# Patient Record
Sex: Female | Born: 1980 | Hispanic: Yes | Marital: Married | Smoking: Former smoker
Health system: Southern US, Community
[De-identification: ages and names within clinical notes are randomized; demographics above are authoritative.]

## PROBLEM LIST (undated history)

## (undated) ENCOUNTER — Inpatient Hospital Stay (HOSPITAL_COMMUNITY): Payer: Self-pay

## (undated) ENCOUNTER — Emergency Department (HOSPITAL_COMMUNITY): Payer: Self-pay

## (undated) DIAGNOSIS — E669 Obesity, unspecified: Secondary | ICD-10-CM

## (undated) HISTORY — DX: Obesity, unspecified: E66.9

---

## 2008-05-29 DIAGNOSIS — E282 Polycystic ovarian syndrome: Secondary | ICD-10-CM

## 2008-05-29 HISTORY — DX: Polycystic ovarian syndrome: E28.2

## 2015-08-29 ENCOUNTER — Encounter (HOSPITAL_COMMUNITY): Payer: Self-pay | Admitting: *Deleted

## 2015-08-29 ENCOUNTER — Emergency Department (HOSPITAL_COMMUNITY): Payer: Self-pay

## 2015-08-29 ENCOUNTER — Emergency Department (HOSPITAL_COMMUNITY)
Admission: EM | Admit: 2015-08-29 | Discharge: 2015-08-29 | Disposition: A | Discharge status: Home / Self Care | Payer: Self-pay | Attending: Emergency Medicine | Admitting: Emergency Medicine

## 2015-08-29 DIAGNOSIS — O200 Threatened abortion: Secondary | ICD-10-CM | POA: Insufficient documentation

## 2015-08-29 DIAGNOSIS — O23591 Infection of other part of genital tract in pregnancy, first trimester: Secondary | ICD-10-CM | POA: Insufficient documentation

## 2015-08-29 DIAGNOSIS — N760 Acute vaginitis: Secondary | ICD-10-CM

## 2015-08-29 DIAGNOSIS — O99331 Smoking (tobacco) complicating pregnancy, first trimester: Secondary | ICD-10-CM | POA: Insufficient documentation

## 2015-08-29 DIAGNOSIS — Z3A01 Less than 8 weeks gestation of pregnancy: Secondary | ICD-10-CM | POA: Insufficient documentation

## 2015-08-29 DIAGNOSIS — N939 Abnormal uterine and vaginal bleeding, unspecified: Secondary | ICD-10-CM

## 2015-08-29 DIAGNOSIS — F17210 Nicotine dependence, cigarettes, uncomplicated: Secondary | ICD-10-CM | POA: Insufficient documentation

## 2015-08-29 DIAGNOSIS — B9689 Other specified bacterial agents as the cause of diseases classified elsewhere: Secondary | ICD-10-CM

## 2015-08-29 LAB — WET PREP, GENITAL
SPERM: NONE SEEN
TRICH WET PREP: NONE SEEN
YEAST WET PREP: NONE SEEN

## 2015-08-29 LAB — CBC WITH DIFFERENTIAL/PLATELET
Basophils Absolute: 0 10*3/uL (ref 0.0–0.1)
Basophils Relative: 0 %
EOS PCT: 1 %
Eosinophils Absolute: 0.1 10*3/uL (ref 0.0–0.7)
HCT: 37.6 % (ref 36.0–46.0)
HEMOGLOBIN: 12.6 g/dL (ref 12.0–15.0)
LYMPHS ABS: 3.7 10*3/uL (ref 0.7–4.0)
LYMPHS PCT: 39 %
MCH: 28.8 pg (ref 26.0–34.0)
MCHC: 33.5 g/dL (ref 30.0–36.0)
MCV: 85.8 fL (ref 78.0–100.0)
Monocytes Absolute: 0.7 10*3/uL (ref 0.1–1.0)
Monocytes Relative: 7 %
NEUTROS PCT: 53 %
Neutro Abs: 4.9 10*3/uL (ref 1.7–7.7)
Platelets: 221 10*3/uL (ref 150–400)
RBC: 4.38 MIL/uL (ref 3.87–5.11)
RDW: 13.2 % (ref 11.5–15.5)
WBC: 9.4 10*3/uL (ref 4.0–10.5)

## 2015-08-29 LAB — BASIC METABOLIC PANEL
Anion gap: 6 (ref 5–15)
BUN: 12 mg/dL (ref 6–20)
CHLORIDE: 109 mmol/L (ref 101–111)
CO2: 23 mmol/L (ref 22–32)
Calcium: 8.7 mg/dL — ABNORMAL LOW (ref 8.9–10.3)
Creatinine, Ser: 0.66 mg/dL (ref 0.44–1.00)
GFR calc Af Amer: >60 mL/min (ref >60)
GFR calc non Af Amer: >60 mL/min (ref >60)
Glucose, Bld: 91 mg/dL (ref 65–99)
POTASSIUM: 3.7 mmol/L (ref 3.5–5.1)
SODIUM: 138 mmol/L (ref 135–145)

## 2015-08-29 LAB — URINALYSIS, ROUTINE W REFLEX MICROSCOPIC
BILIRUBIN URINE: NEGATIVE
GLUCOSE, UA: NEGATIVE mg/dL
KETONES UR: NEGATIVE mg/dL
Nitrite: NEGATIVE
PH: 6 (ref 5.0–8.0)
PROTEIN: NEGATIVE mg/dL
SPECIFIC GRAVITY, URINE: 1.023 (ref 1.005–1.030)

## 2015-08-29 LAB — URINE MICROSCOPIC-ADD ON

## 2015-08-29 LAB — I-STAT BETA HCG BLOOD, ED (MC, WL, AP ONLY): I-stat hCG, quantitative: >2000 m[IU]/mL — ABNORMAL HIGH (ref <5)

## 2015-08-29 LAB — HCG, QUANTITATIVE, PREGNANCY: hCG, Beta Chain, Quant, S: 16821 m[IU]/mL — ABNORMAL HIGH (ref <5)

## 2015-08-29 LAB — ABO/RH: ABO/RH(D): AB POS

## 2015-08-29 MED ORDER — METRONIDAZOLE 500 MG PO TABS: 500.0000 mg | ORAL_TABLET | Freq: Two times a day (BID) | ORAL | Status: DC

## 2015-08-29 NOTE — Discharge Instructions (Signed)
[facility] (TUESDAY April 4TH) TO HAVE YOUR BLOODWORK RECHECKED  Return to the ED with any concerns including vomiting and not able to keep down liquids, bleeding and soaking more than one pad per hour, worsening abdominal pain, fainting, decreased level of alertness/lethargy, or any other alarming symptoms

## 2015-08-29 NOTE — ED Provider Notes (Signed)
CSN: [id number]     Arrival date & time 08/29/15  0012 History  By signing my name below, I, Phillis Haggis, attest that this documentation has been prepared under the direction and in the presence of Jerelyn Scott, MD. Electronically Signed: Phillis Haggis, ED Scribe. 08/29/2015. 3:11 AM.  Chief Complaint  Patient presents with  . Abdominal Pain  . Vaginal Bleeding   Patient is a 35 y.o. female presenting with abdominal pain and vaginal bleeding. The history is provided by a friend. No language interpreter was used.  Abdominal Pain Pain location:  LLQ and RLQ Pain quality: cramping   Pain radiates to:  Does not radiate Pain severity:  Moderate Onset quality:  Sudden Duration:  2 days Timing:  Constant Progression:  Worsening Chronicity:  New Ineffective treatments:  None tried Associated symptoms: vaginal bleeding   Associated symptoms: no chills, no fever, no nausea and no vomiting   Risk factors: pregnancy   Vaginal Bleeding Associated symptoms: abdominal pain   Associated symptoms: no dizziness, no fever and no nausea   HPI Comments: Lindsey Wells is a 35 y.o. Female G2P1 who presents to the Emergency Department complaining of lower cramping abdominal pain onset 2 days ago. She reports associated vaginal bleeding with small amount of clots. LMP 07/15/15. This is the pt's second pregnancy and she had a normal, full term first pregnancy. She does not currently have OB/GYN care. Pt denies fever, chills, nausea, vomiting, dizziness, or lightheadedness. Pt speaks Spanish and is translated by a significant other.   History reviewed. No pertinent past medical history. History reviewed. No pertinent past surgical history. No family history on file. Social History  Substance Use Topics  . Smoking status: Current Some Day Smoker  . Smokeless tobacco: None  . Alcohol Use: Yes     Comment: occ   OB History    Gravida Para Term Preterm AB TAB SAB Ectopic Multiple Living   1               Review of Systems  Constitutional: Negative for fever and chills.  Gastrointestinal: Positive for abdominal pain. Negative for nausea and vomiting.  Genitourinary: Positive for vaginal bleeding.  Neurological: Negative for dizziness and light-headedness.  All other systems reviewed and are negative.     Allergies  Review of patient's allergies indicates no known allergies.  Home Medications   Prior to Admission medications   Medication Sig Start Date End Date Taking? Authorizing Provider  metroNIDAZOLE (FLAGYL) 500 MG tablet Take 1 tablet (500 mg total) by mouth 2 (two) times daily. 08/29/15   Jerelyn Scott, MD   BP 110/72 mmHg  Pulse 46  Temp(Src) 99.2 F (37.3 C) (Oral)  Resp 17  SpO2 99%  LMP 07/15/2015  Vitals reviewed Physical Exam  Physical Examination: General appearance - alert, well appearing, and in no distress Mental status - alert, oriented to person, place, and time Eyes - no conjunctival injection, no scleral icterus Mouth - mucous membranes moist, pharynx normal without lesions Chest - clear to auscultation, no wheezes, rales or rhonchi, symmetric air entry Heart - normal rate, regular rhythm, normal S1, S2, no murmurs, rubs, clicks or gallops Abdomen - soft, nontender, nondistended, no masses or organomegaly Pelvic - normal external genitalia, vulva, vagina, cervix, uterus and adnexa, yellowish discharge, os closed, no active bleeding Neurological - alert, oriented, normal speech Extremities - peripheral pulses normal, no pedal edema, no clubbing or cyanosis Skin - normal coloration and turgor, no rashes  ED Course  Procedures (including critical care time) DIAGNOSTIC STUDIES: Oxygen Saturation is 99% on RA, normal by my interpretation.    COORDINATION OF CARE: 2:02 AM-Discussed treatment plan which includes labs, pelvic exam and [place] with pt at bedside and pt agreed to plan.    Labs Review Labs Reviewed  WET PREP, GENITAL - Abnormal; Notable for the  following:    Clue Cells Wet Prep HPF POC PRESENT (*)    WBC, Wet Prep HPF POC MANY (*)    All other components within normal limits  BASIC METABOLIC PANEL - Abnormal; Notable for the following:    Calcium 8.7 (*)    All other components within normal limits  URINALYSIS, ROUTINE W REFLEX MICROSCOPIC (NOT AT [facility]) - Abnormal; Notable for the following:    APPearance CLOUDY (*)    Hgb urine dipstick TRACE (*)    Leukocytes, UA SMALL (*)    All other components within normal limits  URINE MICROSCOPIC-ADD ON - Abnormal; Notable for the following:    Squamous Epithelial / LPF 6-30 (*)    Bacteria, UA FEW (*)    All other components within normal limits  HCG, QUANTITATIVE, PREGNANCY - Abnormal; Notable for the following:    hCG, Beta Chain, Quant, S 16109 (*)    All other components within normal limits  I-STAT BETA HCG BLOOD, ED ([facility], [facility], [facility] ONLY) - Abnormal; Notable for the following:    I-stat hCG, quantitative >2000.0 (*)    All other components within normal limits  CBC WITH DIFFERENTIAL/PLATELET  ABO/RH  GC/CHLAMYDIA PROBE AMP ([place]) NOT AT [facility]    Imaging Review US Ob Comp Less 14 Wks  08/29/2015  CLINICAL DATA:  Six weeks 3 days pregnant by last menstrual with pain for 2 days. Bleeding. Beta HCG greater than 2000. EXAM: OBSTETRIC <14 WK [place] AND TRANSVAGINAL OB US TECHNIQUE: Both transabdominal and transvaginal ultrasound examinations were performed for complete evaluation of the gestation as well as the maternal uterus, adnexal regions, and pelvic cul-de-sac. Transvaginal technique was performed to assess early pregnancy. COMPARISON:  None. FINDINGS: Intrauterine gestational sac: Visualized/normal in shape. Yolk sac:  Absent Embryo:  Absent Cardiac Activity: Absent MSD: 18  mm   6  w   5  d [place] EDC: 04/18/2016 Subchorionic hemorrhage: Small volume subchronic hemorrhage identified anteriorly, including on image 40. Maternal uterus/adnexae: Normal appearance of the ovaries. Trace free  pelvic fluid is likely physiologic. IMPRESSION: 1. Intrauterine gestational sac of 18 mm, corresponding to 6 weeks 5 days. Lack of visualization of fetal pole. Although this could be due to early gestational age, given the sac diameter, this is suspicious for a nonviable pregnancy. Consider short term ultrasound and/or beta HCG follow-up. 2. Small volume subchronic hemorrhage. Electronically Signed   By: Jeronimo Greaves M.D.   On: 08/29/2015 02:56   US Ob Transvaginal  08/29/2015  CLINICAL DATA:  Six weeks 3 days pregnant by last menstrual with pain for 2 days. Bleeding. Beta HCG greater than 2000. EXAM: OBSTETRIC <14 WK [place] AND TRANSVAGINAL OB US TECHNIQUE: Both transabdominal and transvaginal ultrasound examinations were performed for complete evaluation of the gestation as well as the maternal uterus, adnexal regions, and pelvic cul-de-sac. Transvaginal technique was performed to assess early pregnancy. COMPARISON:  None. FINDINGS: Intrauterine gestational sac: Visualized/normal in shape. Yolk sac:  Absent Embryo:  Absent Cardiac Activity: Absent MSD: 18  mm   6  w   5  d [place] EDC: 04/18/2016 Subchorionic hemorrhage: Small volume subchronic hemorrhage identified  anteriorly, including on image 40. Maternal uterus/adnexae: Normal appearance of the ovaries. Trace free pelvic fluid is likely physiologic. IMPRESSION: 1. Intrauterine gestational sac of 18 mm, corresponding to 6 weeks 5 days. Lack of visualization of fetal pole. Although this could be due to early gestational age, given the sac diameter, this is suspicious for a nonviable pregnancy. Consider short term ultrasound and/or beta HCG follow-up. 2. Small volume subchronic hemorrhage. Electronically Signed   By: Jeronimo GreavesKyle  Talbot M.D.   On: 08/29/2015 02:56   I have personally reviewed and evaluated these images and lab results as part of my medical decision-making.   EKG Interpretation None      MDM   Final diagnoses:  Vaginal bleeding  Threatened  abortion in early pregnancy  Bacterial vaginosis    Pt presenting with c/o vaginal bleeding in early pregnancy.  Ultrasound shows gestational sac without fetal pole or yolk sac.  Pelvic exam with os closed no significant bleeding- wet prep shows BV- will treat with flagyl.  Pt will need f/u quant in 48 hours and repeat ultrasound in 1 week- quant is 16, 000 which is above the threshold that should be seen on ultrasound.     5:20 AM d/w Dr. Despina HiddenEure, OB/GYN- he recommends patient followup at [facility] in 48 hours for repeat quant.    Pt verbalizes understanding of this plan.    I personally performed the services described in this documentation, which was scribed in my presence. The recorded information has been reviewed and is accurate.    Jerelyn ScottMartha Linker, MD 08/29/15 [id number]

## 2015-08-29 NOTE — ED Notes (Signed)
PT is [redacted] weeks pregnant and started having abdominal pain and vaginal bleeding for 2 days.  G-2 P-1 A 0.

## 2015-08-29 NOTE — ED Notes (Signed)
Patient is alert and orientedx4.  Patient was explained discharge instructions and they understood them with no questions.   

## 2015-08-29 NOTE — ED Notes (Signed)
Taken to US at this time. 

## 2015-08-30 ENCOUNTER — Inpatient Hospital Stay (HOSPITAL_COMMUNITY)
Admission: AD | Admit: 2015-08-30 | Discharge: 2015-08-30 | Disposition: A | Discharge status: Home / Self Care | Payer: Self-pay | Source: Ambulatory Visit | Attending: Family Medicine | Admitting: Family Medicine

## 2015-08-30 ENCOUNTER — Encounter (HOSPITAL_COMMUNITY): Payer: Self-pay | Admitting: *Deleted

## 2015-08-30 DIAGNOSIS — O26891 Other specified pregnancy related conditions, first trimester: Secondary | ICD-10-CM | POA: Insufficient documentation

## 2015-08-30 DIAGNOSIS — Z3A01 Less than 8 weeks gestation of pregnancy: Secondary | ICD-10-CM | POA: Insufficient documentation

## 2015-08-30 DIAGNOSIS — Z79899 Other long term (current) drug therapy: Secondary | ICD-10-CM | POA: Insufficient documentation

## 2015-08-30 DIAGNOSIS — O9989 Other specified diseases and conditions complicating pregnancy, childbirth and the puerperium: Secondary | ICD-10-CM

## 2015-08-30 DIAGNOSIS — F17200 Nicotine dependence, unspecified, uncomplicated: Secondary | ICD-10-CM | POA: Insufficient documentation

## 2015-08-30 DIAGNOSIS — R109 Unspecified abdominal pain: Secondary | ICD-10-CM

## 2015-08-30 DIAGNOSIS — O3680X0 Pregnancy with inconclusive fetal viability, not applicable or unspecified: Secondary | ICD-10-CM

## 2015-08-30 LAB — GC/CHLAMYDIA PROBE AMP (~~LOC~~) NOT AT ARMC
Chlamydia: NEGATIVE
Neisseria Gonorrhea: NEGATIVE

## 2015-08-30 LAB — HCG, QUANTITATIVE, PREGNANCY: HCG, BETA CHAIN, QUANT, S: 22269 m[IU]/mL — AB (ref <5)

## 2015-08-30 NOTE — [facility] Note (Signed)
Pt presents for follow up blood work . Denies bleeding or pain.

## 2015-08-30 NOTE — MAU Provider Note (Signed)
History   [id number]   Chief Complaint  Patient presents with  . Labs Only    f/u BHCG    HPI Lindsey Wells is a 35 y.o. female G1P0 here for follow-up BHCG.  Upon review of the records patient was first [facility] (just after midnight[facility]. BHCG on that day was [id number].  Ultrasound showed 18 mm IUGS, no yolk sac, & a small SCH. GC/CT and wet prep were collected. Results were negative. Pt discharged home & [facility]. Pt here today with no report of abdominal pain or vaginal bleeding. All other systems negative.    Patient's last menstrual period was 07/15/2015.  OB History  Gravida Para Term Preterm AB SAB TAB Ectopic Multiple Living  1             # Outcome Date GA Lbr Len/2nd Weight Sex Delivery Anes PTL Lv  1 Current               No past medical history on file.  No family history on file.  Social History   Social History  . Marital Status: Single    Spouse Name: N/A  . Number of Children: N/A  . Years of Education: N/A   Social History Main Topics  . Smoking status: Current Some Day Smoker  . Smokeless tobacco: Not on file  . Alcohol Use: Yes     Comment: occ  . Drug Use: No  . Sexual Activity: Not on file   Other Topics Concern  . Not on file   Social History Narrative    No Known Allergies  No current facility-administered medications on file prior to encounter.   Current Outpatient Prescriptions on File Prior to Encounter  Medication Sig Dispense Refill  . metroNIDAZOLE (FLAGYL) 500 MG tablet Take 1 tablet (500 mg total) by mouth 2 (two) times daily. 14 tablet 0     Physical Exam   Filed Vitals:   08/30/15 2011  BP: 118/50  Pulse: 55  Temp: 98.4 F (36.9 C)  TempSrc: Oral  Resp: 16  Height: 5\' 5"  (1.651 m)  Weight: 180 lb (81.647 kg)  SpO2: 100%    Physical Exam  Constitutional: She is oriented to person, place, and time. She appears well-developed and well-nourished. No distress.   HENT:  Head: Normocephalic and atraumatic.  Respiratory: Effort normal. No respiratory distress.  Musculoskeletal: Normal range of motion.  Neurological: She is alert and oriented to person, place, and time.  Skin: She is not diaphoretic.  Psychiatric: She has a normal mood and affect. Her behavior is normal. Judgment and thought content normal.    MAU Course  Procedures Component     Latest Ref Rng 08/29/2015 08/30/2015  HCG, Beta Chain, Quant, S     <5 mIU/mL 16821 (H) 22269 (H)   MDM AB positive Denies pain or bleeding S/w Dr. Adrian Wells regarding BHCG & previous ultrasound. Pt to f/u in clinic Thursday morning for repeat BHCG  Assessment and Plan  35 y.o. G1P0 at 4841w4d wks Pregnancy Follow-up BHCG Pregnancy of Unknown Location  P: Discharge home Return to clinic Thursday morning at 11 am for BHCG Discussed reasons to return to [facility] Ectopic & miscarriage precautions given  Lindsey HornErin Rileyann Florance, NP 08/30/2015 10:00 PM

## 2015-08-30 NOTE — Discharge Instructions (Signed)
Dolor abdominal en el embarazo  (Abdominal Pain During Pregnancy)  El dolor abdominal es frecuente durante el embarazo. Generalmente no causa ningún daño. El dolor abdominal puede tener numerosas causas. Algunas causas son más graves que otras. Ciertas causas de dolor abdominal durante el embarazo se diagnostican fácilmente. A veces, se tarda un tiempo para llegar al diagnóstico. Otras veces la causa no se conoce. El dolor abdominal puede estar relacionado con alguna alteración del embarazo, o puede deberse a una causa totalmente diferente. Por este motivo, siempre consulte a su médico cuando sienta molestias abdominales.  INSTRUCCIONES PARA EL CUIDADO EN EL HOGAR   Esté atenta al dolor para ver si hay cambios. Las siguientes indicaciones ayudarán a aliviar cualquier molestia que pueda sentir:  · No tenga relaciones sexuales y no coloque nada dentro de la vagina hasta que los síntomas hayan desaparecido completamente.  · Descanse todo lo que pueda hasta que el dolor se le haya calmado.  · Si siente náuseas, beba líquidos claros. Evite los alimentos sólidos mientras sienta malestar o tenga náuseas.  · Tome sólo medicamentos de venta libre o recetados, según las indicaciones del médico.  · Cumpla con todas las visitas de control, según le indique su médico.  SOLICITE ATENCIÓN MÉDICA DE INMEDIATO SI:  · Tiene un sangrado, pérdida de líquidos o elimina tejidos por la vagina.  · El dolor o los cólicos aumentan.  · Tiene vómitos persistentes.  · Comienza a sentir dolor al orinar u observa sangre.  · Tiene fiebre.  · Nota que los movimientos del bebé disminuyen.  · Siente intensa debilidad o se marea.  · Tiene dificultad para respirar con o sin dolor abdominal.  · Siente un dolor de cabeza intenso junto al dolor abdominal.  · Tiene una secreción vaginal anormal con dolor abdominal.  · Tiene diarrea persistente.  · El dolor abdominal sigue o empeora aún después de hacer reposo.  ASEGÚRESE DE QUE:   · Comprende estas  instrucciones.  · Controlará su afección.  · Recibirá ayuda de inmediato si no mejora o si empeora.     Esta información no tiene como fin reemplazar el consejo del médico. Asegúrese de hacerle al médico cualquier pregunta que tenga.     Document Released: 05/15/2005 Document Revised: 03/05/2013  Elsevier Interactive Patient Education ©2016 [employer] Inc[employer]

## 2015-09-02 ENCOUNTER — Ambulatory Visit (INDEPENDENT_AMBULATORY_CARE_PROVIDER_SITE_OTHER): Payer: Self-pay | Admitting: Certified Nurse Midwife

## 2015-09-02 DIAGNOSIS — O0281 Inappropriate change in quantitative human chorionic gonadotropin (hCG) in early pregnancy: Secondary | ICD-10-CM

## 2015-09-02 DIAGNOSIS — O3680X0 Pregnancy with inconclusive fetal viability, not applicable or unspecified: Secondary | ICD-10-CM

## 2015-09-02 LAB — HCG, QUANTITATIVE, PREGNANCY: hCG, Beta Chain, Quant, S: 37661 m[IU]/mL — ABNORMAL HIGH (ref <5)

## 2015-09-02 NOTE — Progress Notes (Signed)
Patient ID: Lindsey Wells, female   DOB: [birth year], 35 y.o.   MRN: [id number] [name]  is a 35 y.o. G1P0 at 550w0d who [facility] for follow-up quant hCG. The patient was seen in [facility] on 08/29/15 and had quant hCG of [id number] and US showed IUP but no yolk sac .Today Beta HCG is 37661.  She denies/endorses no  pain, vaginal bleeding or fever today.  [place] Ob Comp Less 14 Wks  08/29/2015  CLINICAL DATA:  Six weeks 3 days pregnant by last menstrual with pain for 2 days. Bleeding. Beta HCG greater than 2000. EXAM: OBSTETRIC <14 WK US AND TRANSVAGINAL OB US TECHNIQUE: Both transabdominal and transvaginal ultrasound examinations were performed for complete evaluation of the gestation as well as the maternal uterus, adnexal regions, and pelvic cul-de-sac. Transvaginal technique was performed to assess early pregnancy. COMPARISON:  None. FINDINGS: Intrauterine gestational sac: Visualized/normal in shape. Yolk sac:  Absent Embryo:  Absent Cardiac Activity: Absent MSD: 18  mm   6  w   5  d US EDC: 04/18/2016 Subchorionic hemorrhage: Small volume subchronic hemorrhage identified anteriorly, including on image 40. Maternal uterus/adnexae: Normal appearance of the ovaries. Trace free pelvic fluid is likely physiologic. IMPRESSION: 1. Intrauterine gestational sac of 18 mm, corresponding to 6 weeks 5 days. Lack of visualization of fetal pole. Although this could be due to early gestational age, given the sac diameter, this is suspicious for a nonviable pregnancy. Consider short term ultrasound and/or beta HCG follow-up. 2. Small volume subchronic hemorrhage. Electronically Signed   By: Jeronimo GreavesKyle  Talbot M.D.   On: 08/29/2015 02:56   [place] Ob Transvaginal  08/29/2015  CLINICAL DATA:  Six weeks 3 days pregnant by last menstrual with pain for 2 days. Bleeding. Beta HCG greater than 2000. EXAM: OBSTETRIC <14 WK US AND TRANSVAGINAL OB US TECHNIQUE: Both transabdominal and transvaginal ultrasound  examinations were performed for complete evaluation of the gestation as well as the maternal uterus, adnexal regions, and pelvic cul-de-sac. Transvaginal technique was performed to assess early pregnancy. COMPARISON:  None. FINDINGS: Intrauterine gestational sac: Visualized/normal in shape. Yolk sac:  Absent Embryo:  Absent Cardiac Activity: Absent MSD: 18  mm   6  w   5  d US EDC: 04/18/2016 Subchorionic hemorrhage: Small volume subchronic hemorrhage identified anteriorly, including on image 40. Maternal uterus/adnexae: Normal appearance of the ovaries. Trace free pelvic fluid is likely physiologic. IMPRESSION: 1. Intrauterine gestational sac of 18 mm, corresponding to 6 weeks 5 days. Lack of visualization of fetal pole. Although this could be due to early gestational age, given the sac diameter, this is suspicious for a nonviable pregnancy. Consider short term ultrasound and/or beta HCG follow-up. 2. Small volume subchronic hemorrhage. Electronically Signed   By: Jeronimo GreavesKyle  Talbot M.D.   On: 08/29/2015 02:56   OB History  Gravida Para Term Preterm AB SAB TAB Ectopic Multiple Living  1             # Outcome Date GA Lbr Len/2nd Weight Sex Delivery Anes PTL Lv  1 Current               No past medical history on file.   LMP 07/15/2015  CONSTITUTIONAL: Well-developed, well-nourished female in no acute distress.  ENT: External right and left ear normal.  EYES: EOM intact, conjunctivae normal.  MUSCULOSKELETAL: Normal range of motion.  CARDIOVASCULAR: Regular heart rate RESPIRATORY: Normal effort NEUROLOGICAL: Alert and oriented to person, place, and time.  SKIN: Skin is warm and dry. No rash noted. Not diaphoretic. No erythema. No pallor. PSYCH: Normal mood and affect. Normal behavior. Normal judgment and thought content.  Results for orders placed or performed in visit on 09/02/15 (from the past 24 hour(s))  hCG, quantitative, pregnancy     Status: Abnormal   Collection Time: 09/02/15 11:08 AM   Result Value Ref Range   hCG, Beta Chain, Sharene Butters, S [address]109 (H) <5 mIU/mL    A: /inappropriate  rise in quant hCG after 48 hours  P: First trimester/ectopic precautions discussed Patient will return for follow-up US ion Monday 09/06/15. Order placed and RN to schedule. Patient will return to [facility] for results following [place].    Rhea Pink, CNM 09/02/2015 1:51 PM

## 2015-09-02 NOTE — Patient Instructions (Signed)
Embarazo ectópico °(Ectopic Pregnancy) °Un embarazo ectópico ocurre cuando el óvulo fertilizado se fija (implanta) fuera del útero. La mayoría de los embarazos ectópicos se producen en la trompa de Falopio. Es raro que ocurran en el ovario, el intestino, la pelvis o el cuello uterino. En un embarazo ectópico, el óvulo fertilizado no tiene la capacidad de llegar a ser un bebé normal y sano.  °Una ruptura en el embarazo ectópico se produce cuando la trompa de Falopio se desgarra o se rompe y da como resultado una hemorragia interna. A menudo hay un intenso dolor abdominal y en algunos casos sangrado vaginal. Tener un embarazo ectópico puede ser una experiencia que pone en peligro la vida. Si no se trata, esta grave afección puede requerir una transfusión de sangre, cirugía abdominal, o incluso causar la muerte. °CAUSAS  °En la mayoría de los casos se sospecha un daño en las trompas de Falopio como causa del embarazo ectópico.  °FACTORES DE RIESGO °Dependiendo de sus circunstancias, el nivel de riesgo de tener un embarazo ectópico variará. El nivel de riesgo puede dividirse en tres categorías. °Alto Riesgo °· Usted ha realizado tratamientos para la infertilidad. °· Ha tenido un embarazo ectópico previo. °· Fue sometida a una cirugía de trompas. °· Tuvo una cirugía previa para ligar las trompas de Falopio (ligadura de trompas). °· Tiene problemas o enfermedades en las trompas. °· Ha estado expuesta al DES. El DES es un medicamento que se utilizó hasta 1971 y tuvo efectos en los bebés cuyas madres lo tomaron. °· Queda embarazada mientras usa un dispositivo intrauterino (DIU) como método anticonceptivo. °Riesgo moderado °· Tiene antecedentes de infertilidad. °· Ha sufrido alguna enfermedad de transmisión sexual (ETS). °· Tiene antecedentes de enfermedad pélvica inflamatoria (EPI). °· Tiene cicatrices por endometriosis. °· Tiene múltiples parejas sexuales. °· Fuma. °Bajo riesgo °· Fue sometida a una cirugía pélvica. °· Usa  duchas vaginales. °· Comenzó a ser sexualmente activa antes de los 18 años de edad. °SIGNOS Y SÍNTOMAS  °El embarazo ectópico se debe sospechar en cualquier mujer a la que le ha faltado un período y tiene dolor abdominal o sangrado. °· Puede experimentar síntomas normales de embarazo, tales como: °¨ Náuseas. °¨ Cansancio. °¨ Inflamación mamaria. °· Otros síntomas son: °¨ Dolor durante las relaciones sexuales. °¨ Hemorragia vaginal o manchado irregular. °¨ Cólicos o dolor en uno de los lados o en la zona inferior del abdomen. °¨ Latidos cardíacos acelerados. °¨ Desmayarse al defecar. °· Los síntomas de un embarazo ectópico con ruptura y hemorragias internas son: °¨ Dolor intenso y súbito en el abdomen y la pelvis. °¨ Mareos o desmayos. °¨ Dolor en la zona del hombro. °DIAGNÓSTICO  °Los exámenes que se indicarán son: °· Test de embarazo. °· Una ecografía. °· Prueba de nivel específico de hormona del embarazo en el torrente sanguíneo. °· Extracción de una muestra de tejido del útero (dilatación y curetaje, D y C). °· Cirugía para realizar un examen visual del interior del abdomen usando un tubo delgado, que emite luz con una pequeña cámara en el extremo (laparoscopio). °TRATAMIENTO  °Se podrá aplicar una inyección de un medicamento denominado metotrexato. Este medicamento hace que se absorba el tejido del embarazo. Se administra en los siguientes casos: °· Hay un diagnóstico temprano. °· La trompa de Falopio no se ha roto. °· Se la considera una buena candidata para recibir el medicamento. °Por lo general, después del tratamiento con metotrexato se comprueban los niveles de hormonas del embarazo. Esto se hace para asegurarse de que el   medicamento es efectivo. Puede llevar entre 4 y 6 semanas para que el embarazo sea absorbido (aunque la mayoría de los embarazos se absorberá a 3 semanas). °Puede ser necesario realizar un tratamiento quirúrgico. Se puede utilizar un laparoscopio para retirar el tejido del embarazo. Si  hay una hemorragia interna grave, se hace un corte (incisión) en la zona inferior del abdomen (laparotomía) y se extirpa el embarazo ectópico. De este modo de detendrá el sangrado. Es posible que también se extirpe parte de la trompa de Falopio o la trompa entera (salpingectomía). Luego de la cirugía, le harán un análisis de hormona del embarazo para asegurarse de que no quedan tejidos. Quizás reciba la vacuna de inmunoglobulina Rho(D) si usted es Rh negativa y el padre es Rh positivo, o si no conoce el tipo de Rh del padre. Esto evita problemas en próximos embarazos. °SOLICITE ATENCIÓN MÉDICA DE INMEDIATO SI:  °Tiene síntomas de embarazo ectópico. Esto es una emergencia médica. °ASEGÚRESE DE QUE: °· Comprende estas instrucciones. °· Controlará su afección. °· Recibirá ayuda de inmediato si no mejora o si empeora. °  °Esta información no tiene como fin reemplazar el consejo del médico. Asegúrese de hacerle al médico cualquier pregunta que tenga. °  °Document Released: 05/15/2005 Document Revised: 06/05/2014 °Elsevier Interactive Patient Education ©2016 Elsevier Inc. ° °

## 2015-09-09 ENCOUNTER — Ambulatory Visit (HOSPITAL_COMMUNITY)
Admission: RE | Admit: 2015-09-09 | Discharge: 2015-09-09 | Disposition: A | Discharge status: Home / Self Care | Payer: Self-pay | Source: Ambulatory Visit | Attending: Certified Nurse Midwife | Admitting: Certified Nurse Midwife

## 2015-09-09 DIAGNOSIS — Z3A08 8 weeks gestation of pregnancy: Secondary | ICD-10-CM | POA: Insufficient documentation

## 2015-09-09 DIAGNOSIS — Z36 Encounter for antenatal screening of mother: Secondary | ICD-10-CM | POA: Insufficient documentation

## 2015-09-09 DIAGNOSIS — O3680X0 Pregnancy with inconclusive fetal viability, not applicable or unspecified: Secondary | ICD-10-CM

## 2015-09-17 ENCOUNTER — Encounter (HOSPITAL_COMMUNITY): Payer: Self-pay | Admitting: *Deleted

## 2015-09-17 ENCOUNTER — Inpatient Hospital Stay (HOSPITAL_COMMUNITY)
Admission: AD | Admit: 2015-09-17 | Discharge: 2015-09-17 | Disposition: A | Discharge status: Home / Self Care | Payer: Self-pay | Source: Ambulatory Visit | Attending: Obstetrics and Gynecology | Admitting: Obstetrics and Gynecology

## 2015-09-17 DIAGNOSIS — N930 Postcoital and contact bleeding: Secondary | ICD-10-CM

## 2015-09-17 DIAGNOSIS — O209 Hemorrhage in early pregnancy, unspecified: Secondary | ICD-10-CM | POA: Insufficient documentation

## 2015-09-17 DIAGNOSIS — Z3490 Encounter for supervision of normal pregnancy, unspecified, unspecified trimester: Secondary | ICD-10-CM

## 2015-09-17 DIAGNOSIS — O4691 Antepartum hemorrhage, unspecified, first trimester: Secondary | ICD-10-CM

## 2015-09-17 DIAGNOSIS — Z3A09 9 weeks gestation of pregnancy: Secondary | ICD-10-CM | POA: Insufficient documentation

## 2015-09-17 DIAGNOSIS — Z87891 Personal history of nicotine dependence: Secondary | ICD-10-CM | POA: Insufficient documentation

## 2015-09-17 LAB — URINE MICROSCOPIC-ADD ON: WBC UA: NONE SEEN WBC/hpf (ref 0–5)

## 2015-09-17 LAB — URINALYSIS, ROUTINE W REFLEX MICROSCOPIC
BILIRUBIN URINE: NEGATIVE
Glucose, UA: NEGATIVE mg/dL
Ketones, ur: NEGATIVE mg/dL
Leukocytes, UA: NEGATIVE
Nitrite: NEGATIVE
PH: 5.5 (ref 5.0–8.0)
Protein, ur: NEGATIVE mg/dL
SPECIFIC GRAVITY, URINE: 1.02 (ref 1.005–1.030)

## 2015-09-17 NOTE — MAU Provider Note (Signed)
  History     CSN: [id number]  Arrival date and time: 09/17/15 0047   First Provider Initiated Contact with Patient 09/17/15 0127      Chief Complaint  Patient presents with  . Vaginal Bleeding   HPI Comments: Patient had US with documented IUP on 09/09/15   Vaginal Bleeding The patient's primary symptoms include vaginal bleeding. This is a new problem. The current episode started today (around 0030 right after intercourse ). The problem occurs intermittently. The problem has been resolved. The pain is mild. The problem affects both sides. She is pregnant. The vaginal discharge was bloody. The vaginal bleeding is spotting. She has been passing clots (about the size of a grape ). She has not been passing tissue. The symptoms are aggravated by intercourse. She has tried nothing for the symptoms. She is sexually active. Her menstrual history has been regular.    History reviewed. No pertinent past medical history.  History reviewed. No pertinent past surgical history.  History reviewed. No pertinent family history.  Social History  Substance Use Topics  . Smoking status: Former Games developermoker  . Smokeless tobacco: None  . Alcohol Use: Yes     Comment: occ   LAST TIME-  PAST WEEKEND   -BEER    Allergies: No Known Allergies  Prescriptions prior to admission  Medication Sig Dispense Refill Last Dose  . metroNIDAZOLE (FLAGYL) 500 MG tablet Take 1 tablet (500 mg total) by mouth 2 (two) times daily. 14 tablet 0 Past Month at Unknown time    Review of Systems  Genitourinary: Positive for vaginal bleeding.   Physical Exam   Blood pressure 123/63, pulse 59, temperature 98.5 F (36.9 C), temperature source Oral, resp. rate 15, height 5\' 5"  (1.651 m), weight 81.647 kg (180 lb), last menstrual period 07/15/2015, SpO2 99 %.  Physical Exam  Nursing note and vitals reviewed. Constitutional: She appears well-developed and well-nourished. No distress.  HENT:  Head: Normocephalic.   Cardiovascular: Normal rate.   Respiratory: Effort normal.  GI: Soft. There is no tenderness. There is no rebound.  Neurological: She is alert.  Skin: Skin is warm and dry.  Psychiatric: She has a normal mood and affect.   Bedside US: shows fetus with CRL measuring 8 weeks 5 days, +cardiac activity seen.  MAU Course  Procedures  MDM   Assessment and Plan   1. Intrauterine pregnancy   2. Postcoital bleeding   3. [redacted] weeks gestation of pregnancy    DC home Comfort measures reviewed  1st Trimester precautions  Bleeding precautions Pelvic rest  RX: none  Return to MAU as needed FU with OB as planned  Follow-up Information    [facility]   Why:  As scheduled   Contact information:   [address] [place] [id number] [phone]         Tawnya CrookHogan, Stefhanie Kachmar Donovan 09/17/2015, 1:37 AM

## 2015-09-17 NOTE — MAU Note (Signed)
Pt reports vaginal bleeding and passed a large clot about 0030. Some pain when the bleeding started, none now.

## 2015-09-17 NOTE — Discharge Instructions (Signed)
Reposo plvico  (Pelvic Rest) El reposo plvico se recomienda a las mujeres cuando:   La placenta cubre parcial o completamente la abertura del cuello del tero (placenta previa).  Hay sangrado entre la pared del tero y el saco amnitico en el primer trimestre (hemorragia subcorinica).  El cuello uterino comienza a abrirse sin iniciarse el trabajo de parto (cuello uterino incompetente, insuficiencia cervical).  El Evergreen Parktrabajo de parto se inicia muy pronto (parto prematuro). INSTRUCCIONES PARA EL CUIDADO EN EL HOGAR   No tenga relaciones sexuales, estimulacin, ni orgasmos.  No use tampones, no se haga duchas vaginales ni coloque ningn objeto en la vagina.  No levante objetos que pesen ms de 10 libras (4,5 kg).  Evite las actividades extenuantes o tensionar los msculos de la pelvis. SOLICITE ATENCIN MDICA SI:   Tiene un sangrado vaginal durante el embarazo. Considrelo como una posible emergencia.  Siente clicos en la zona baja del estmago (ms fuertes que los clicos menstruales).  Nota flujo vaginal (acuoso, con moco o [place]angre).  Siente un dolor en la espalda leve y sordo.  Tiene contracciones regulares o endurecimiento del tero. SOLICITE ATENCIN MDICA DE INMEDIATO SI:  Observa sangrado vaginal y tiene placenta previa.    Esta informacin no tiene Theme park managercomo fin reemplazar el consejo del mdico. Asegrese de hacerle al mdico cualquier pregunta que tenga.   Document Released: 02/07/2012 Elsevier Interactive Patient Education [employer]2016 Elsevier Inc.

## 2015-09-17 NOTE — MAU Note (Signed)
PT SAYS  WITH INTERPRETER- MADAY-       THAT  AT 0030 -  SHE PASSED  A CLOT - GOLF BALL SIZE- FLAT.          SHE WAS ALREADY BLEEDING- STARTED AT 12MN    AFTER  HAVING  SEX.      NOW - SAYS SHE HAS SMALL AMT  ON PAD - SMALL AMT LIGHT RED  STREAKS.        NO PAIN OR CRAMPING  THROUGH ANY OF THIS.     PNC WITH   HD-   APPOINTMENT ON 5-15.

## 2015-10-14 LAB — CYTOLOGY - PAP: PAP SMEAR: NEGATIVE

## 2015-10-14 LAB — CYSTIC FIBROSIS DIAGNOSTIC STUDY: INTERPRETATION-CFDNA: NEGATIVE

## 2015-10-14 LAB — OB RESULTS CONSOLE VARICELLA ZOSTER ANTIBODY, IGG: Varicella: IMMUNE

## 2015-10-14 LAB — SICKLE CELL SCREEN: Sickle Cell Screen: NORMAL

## 2015-10-15 ENCOUNTER — Ambulatory Visit (HOSPITAL_COMMUNITY)
Admission: RE | Admit: 2015-10-15 | Discharge: 2015-10-15 | Disposition: A | Discharge status: Home / Self Care | Payer: Self-pay | Source: Ambulatory Visit | Attending: Nurse Practitioner | Admitting: Nurse Practitioner

## 2015-10-15 ENCOUNTER — Encounter (HOSPITAL_COMMUNITY): Payer: Self-pay

## 2015-10-15 ENCOUNTER — Other Ambulatory Visit (HOSPITAL_COMMUNITY): Payer: Self-pay | Admitting: Nurse Practitioner

## 2015-10-15 DIAGNOSIS — Z3A13 13 weeks gestation of pregnancy: Secondary | ICD-10-CM

## 2015-10-15 DIAGNOSIS — Z3682 Encounter for antenatal screening for nuchal translucency: Secondary | ICD-10-CM

## 2015-10-15 DIAGNOSIS — Z3A12 12 weeks gestation of pregnancy: Secondary | ICD-10-CM | POA: Insufficient documentation

## 2015-10-15 DIAGNOSIS — Z36 Encounter for antenatal screening of mother: Secondary | ICD-10-CM | POA: Insufficient documentation

## 2015-10-21 ENCOUNTER — Other Ambulatory Visit (HOSPITAL_COMMUNITY): Payer: Self-pay

## 2015-11-12 ENCOUNTER — Other Ambulatory Visit (HOSPITAL_COMMUNITY): Payer: Self-pay | Admitting: Nurse Practitioner

## 2015-11-12 DIAGNOSIS — Z3689 Encounter for other specified antenatal screening: Secondary | ICD-10-CM

## 2015-11-26 ENCOUNTER — Ambulatory Visit (HOSPITAL_COMMUNITY)
Admission: RE | Admit: 2015-11-26 | Discharge: 2015-11-26 | Disposition: A | Discharge status: Home / Self Care | Payer: Self-pay | Source: Ambulatory Visit | Attending: Nurse Practitioner | Admitting: Nurse Practitioner

## 2015-11-26 ENCOUNTER — Other Ambulatory Visit (HOSPITAL_COMMUNITY): Payer: Self-pay | Admitting: Nurse Practitioner

## 2015-11-26 DIAGNOSIS — Z3A18 18 weeks gestation of pregnancy: Secondary | ICD-10-CM

## 2015-11-26 DIAGNOSIS — Z3689 Encounter for other specified antenatal screening: Secondary | ICD-10-CM

## 2015-11-26 DIAGNOSIS — Z36 Encounter for antenatal screening of mother: Secondary | ICD-10-CM | POA: Insufficient documentation

## 2015-12-20 ENCOUNTER — Encounter (HOSPITAL_COMMUNITY): Payer: Self-pay | Admitting: *Deleted

## 2015-12-20 ENCOUNTER — Inpatient Hospital Stay (HOSPITAL_COMMUNITY)
Admission: AD | Admit: 2015-12-20 | Discharge: 2016-01-04 | DRG: 765 | Disposition: A | Discharge status: Home / Self Care | Payer: Medicaid Other | Source: Ambulatory Visit | Attending: Family Medicine | Admitting: Family Medicine

## 2015-12-20 ENCOUNTER — Inpatient Hospital Stay (HOSPITAL_COMMUNITY): Payer: Medicaid Other

## 2015-12-20 DIAGNOSIS — O321XX0 Maternal care for breech presentation, not applicable or unspecified: Secondary | ICD-10-CM | POA: Diagnosis not present

## 2015-12-20 DIAGNOSIS — R102 Pelvic and perineal pain: Secondary | ICD-10-CM | POA: Diagnosis present

## 2015-12-20 DIAGNOSIS — O99334 Smoking (tobacco) complicating childbirth: Secondary | ICD-10-CM | POA: Diagnosis present

## 2015-12-20 DIAGNOSIS — Z3A22 22 weeks gestation of pregnancy: Secondary | ICD-10-CM

## 2015-12-20 DIAGNOSIS — O99892 Other specified diseases and conditions complicating childbirth: Secondary | ICD-10-CM

## 2015-12-20 DIAGNOSIS — O411220 Chorioamnionitis, second trimester, not applicable or unspecified: Secondary | ICD-10-CM | POA: Diagnosis not present

## 2015-12-20 DIAGNOSIS — O3430 Maternal care for cervical incompetence, unspecified trimester: Secondary | ICD-10-CM | POA: Diagnosis present

## 2015-12-20 DIAGNOSIS — N883 Incompetence of cervix uteri: Secondary | ICD-10-CM

## 2015-12-20 DIAGNOSIS — O42913 Preterm premature rupture of membranes, unspecified as to length of time between rupture and onset of labor, third trimester: Secondary | ICD-10-CM | POA: Diagnosis not present

## 2015-12-20 DIAGNOSIS — O3432 Maternal care for cervical incompetence, second trimester: Secondary | ICD-10-CM | POA: Diagnosis not present

## 2015-12-20 LAB — URINALYSIS, ROUTINE W REFLEX MICROSCOPIC
Bilirubin Urine: NEGATIVE
GLUCOSE, UA: NEGATIVE mg/dL
HGB URINE DIPSTICK: NEGATIVE
KETONES UR: NEGATIVE mg/dL
Nitrite: NEGATIVE
PROTEIN: NEGATIVE mg/dL
Specific Gravity, Urine: <1.005 — ABNORMAL LOW (ref 1.005–1.030)
pH: 5.5 (ref 5.0–8.0)

## 2015-12-20 LAB — CBC
HEMATOCRIT: 32.6 % — AB (ref 36.0–46.0)
HEMOGLOBIN: 11.4 g/dL — AB (ref 12.0–15.0)
MCH: 29.5 pg (ref 26.0–34.0)
MCHC: 35 g/dL (ref 30.0–36.0)
MCV: 84.2 fL (ref 78.0–100.0)
Platelets: 209 10*3/uL (ref 150–400)
RBC: 3.87 MIL/uL (ref 3.87–5.11)
RDW: 13.3 % (ref 11.5–15.5)
WBC: 9.8 10*3/uL (ref 4.0–10.5)

## 2015-12-20 LAB — RAPID URINE DRUG SCREEN, HOSP PERFORMED
Amphetamines: NOT DETECTED
BARBITURATES: NOT DETECTED
BENZODIAZEPINES: NOT DETECTED
COCAINE: NOT DETECTED
OPIATES: NOT DETECTED
Tetrahydrocannabinol: NOT DETECTED

## 2015-12-20 LAB — WET PREP, GENITAL
Clue Cells Wet Prep HPF POC: NONE SEEN
SPERM: NONE SEEN
Trich, Wet Prep: NONE SEEN
YEAST WET PREP: NONE SEEN

## 2015-12-20 LAB — URINE MICROSCOPIC-ADD ON: RBC / HPF: NONE SEEN RBC/hpf (ref 0–5)

## 2015-12-20 LAB — AMNISURE RUPTURE OF MEMBRANE (ROM) NOT AT ARMC: Amnisure ROM: NEGATIVE

## 2015-12-20 LAB — ABO/RH: ABO/RH(D): AB POS

## 2015-12-20 LAB — TYPE AND SCREEN
ABO/RH(D): AB POS
Antibody Screen: NEGATIVE

## 2015-12-20 MED ORDER — PRENATAL MULTIVITAMIN CH
1.0000 | ORAL_TABLET | Freq: Every day | ORAL | Status: DC
Start: 2015-12-21 — End: 2016-01-02
  Administered 2015-12-21 – 2016-01-01 (×11): 1 via ORAL
  Filled 2015-12-20 (×12): qty 1

## 2015-12-20 MED ORDER — PROGESTERONE MICRONIZED 200 MG PO CAPS
200.0000 mg | ORAL_CAPSULE | Freq: Every day | ORAL | Status: DC
  Administered 2015-12-20 – 2015-12-31 (×12): 200 mg via VAGINAL
  Filled 2015-12-20 (×15): qty 1

## 2015-12-20 MED ORDER — DOCUSATE SODIUM 100 MG PO CAPS
100.0000 mg | ORAL_CAPSULE | Freq: Every day | ORAL | Status: DC
  Administered 2015-12-21 – 2016-01-03 (×14): 100 mg via ORAL
  Filled 2015-12-20 (×15): qty 1

## 2015-12-20 MED ORDER — ACETAMINOPHEN 325 MG PO TABS: 650.0000 mg | ORAL_TABLET | ORAL | Status: DC | PRN

## 2015-12-20 MED ORDER — CALCIUM CARBONATE ANTACID 500 MG PO CHEW: 2.0000 | CHEWABLE_TABLET | ORAL | Status: DC | PRN

## 2015-12-20 MED ORDER — ZOLPIDEM TARTRATE 5 MG PO TABS: 5.0000 mg | ORAL_TABLET | Freq: Every evening | ORAL | Status: DC | PRN

## 2015-12-20 NOTE — MAU Provider Note (Signed)
OB/GYN Attending MAU Note  Chief Complaint: preterm contractions  SUBJECTIVE Lindsey Wells is a 35 y.o. G2P1001 at [redacted]w[redacted]d by LMP who presents with pelvic pressure and vaginal discharge. Seen at [facility] with SSE revealing 3 cm dilated cervix with BBOW. Sent via EMS. Pressure and discharge began 4 days ago. Pain following intercourse. Previous pregnancy with term SVD. She denies vaginal bleeding, leakage of fluid, fever or chills. No N/V. Spanish interpreter: Eda Royal used   Past Medical History:  Diagnosis Date  . Medical history non-contributory    OB History  Gravida Para Term Preterm AB Living  SAB TAB Ectopic Multiple Live Births               # Outcome Date GA Lbr Len/2nd Weight Sex Delivery Anes PTL Lv  2 Current           1 Term      Vag-Spont        Past Surgical History:  Procedure Laterality Date  . NO PAST SURGERIES     Social History   Social History  . Marital status: Single    Spouse name: N/A  . Number of children: N/A  . Years of education: N/A   Occupational History  . Not on file.   Social History Main Topics  . Smoking status: Current Some Day Smoker  . Smokeless tobacco: Never Used  . Alcohol use Yes     Comment: alcohol 3 weeks ago  . Drug use: No  . Sexual activity: Yes   Other Topics Concern  . Not on file   Social History Narrative  . No narrative on file   No current facility-administered medications on file prior to encounter.    Current Outpatient Prescriptions on File Prior to Encounter  Medication Sig Dispense Refill  . Prenatal Vit-Fe Fumarate-FA (PRENATAL VITAMIN PO) Take by mouth.    . metroNIDAZOLE (FLAGYL) 500 MG tablet Take 1 tablet (500 mg total) by mouth 2 (two) times daily. (Patient not taking: Reported on 10/15/2015) 14 tablet 0   No Known Allergies  ROS: Pertinent items in HPI  OBJECTIVE BP 123/64 (BP Location: Left Arm)   Pulse (!) 57   Temp 98.9 F (37.2 C) (Oral)   LMP 07/15/2015  (Approximate)  CONSTITUTIONAL: Well-developed, well-nourished female in no acute distress.  HENT:  Normocephalic, atraumatic, External right and left ear normal. Oropharynx is clear and moist EYES: Conjunctivae and EOM are normal. Pupils are equal, round, and reactive to light. No scleral icterus.  NECK: Normal range of motion, supple, no masses.  Normal thyroid.  SKIN: Skin is warm and dry. No rash noted. Not diaphoretic. No erythema. No pallor. NEUROLGIC: Alert and oriented to person, place, and time. Normal reflexes, muscle tone coordination. No cranial nerve deficit noted. PSYCHIATRIC: Normal mood and affect. Normal behavior. Normal judgment and thought content. CARDIOVASCULAR: Normal heart rate noted RESPIRATORY: Effort and breath sounds normal, no problems with respiration noted. ABDOMEN: Soft, normal bowel sounds, no distention noted.  Gravid, 22wk size. No tenderness, rebound or guarding.  PELVIC: Normal appearing external genitalia; normal appearing vaginal mucosa.  Cervix is open 3 cm with BBOW, no fetal parts noted. Copious mucous and clear discharge noted, Amnisure, wet prep and pelvic cultures obtained.  MUSCULOSKELETAL: Normal range of motion. No tenderness.  No cyanosis, clubbing, or edema.  2+ distal pulses.  LAB RESULTS Results for orders placed or performed during the hospital encounter of 12/20/15 (  from the past 24 hour(s))  Urinalysis, Routine w reflex microscopic (not at [facility])     Status: Abnormal   Collection Time: 12/20/15  2:00 PM  Result Value Ref Range   Color, Urine YELLOW YELLOW   APPearance CLEAR CLEAR   Specific Gravity, Urine <1.005 (L) 1.005 - 1.030   pH 5.5 5.0 - 8.0   Glucose, UA NEGATIVE NEGATIVE mg/dL   Hgb urine dipstick NEGATIVE NEGATIVE   Bilirubin Urine NEGATIVE NEGATIVE   Ketones, ur NEGATIVE NEGATIVE mg/dL   Protein, ur NEGATIVE NEGATIVE mg/dL   Nitrite NEGATIVE NEGATIVE   Leukocytes, UA SMALL (A) NEGATIVE  Urine microscopic-add on     Status:  Abnormal   Collection Time: 12/20/15  2:00 PM  Result Value Ref Range   Squamous Epithelial / LPF 0-5 (A) NONE SEEN   WBC, UA 0-5 0 - 5 WBC/hpf   RBC / HPF NONE SEEN 0 - 5 RBC/hpf   Bacteria, UA RARE (A) NONE SEEN    IMAGING [place] Mfm Ob Comp + 14 Wk  Result Date: 11/26/2015 OBSTETRICAL ULTRASOUND: This exam was performed within a [place] [facility]. The OB US report was generated in the AS system, and faxed to the ordering physician.  This report is available in the [employer]. See the AS Obstetric US report via the Image Link.   MAU COURSE  ASSESSMENT 1. Incompetency, cervical     PLAN Admit and ensure no actual labor or signs of infection. No evidence of ROM at present. Begin Prometrium.    Reva Bores, MD 12/20/2015 3:02 PM

## 2015-12-20 NOTE — H&P (Signed)
Chief Complaint: preterm contractions  SUBJECTIVE Lindsey Wells is a 35 y.o. G2P1001 at [redacted]w[redacted]d by LMP who presents with pelvic pressure and vaginal discharge. Seen at [facility] with SSE revealing 3 cm dilated cervix with BBOW. Sent via EMS. Pressure and discharge began 4 days ago. Pain following intercourse. Previous pregnancy with term SVD. She denies vaginal bleeding, leakage of fluid, fever or chills. No N/V. Spanish interpreter: Eda Royal used       Past Medical History:  Diagnosis Date  . Medical history non-contributory                    OB History  Gravida Para Term Preterm AB Living  2 1 1     1   SAB TAB Ectopic Multiple Live Births               # Outcome Date GA Lbr Len/2nd Weight Sex Delivery Anes PTL Lv  2 Current           1 Term      Vag-Spont             Past Surgical History:  Procedure Laterality Date  . NO PAST SURGERIES     Social History        Social History  . Marital status: Single    Spouse name: N/A  . Number of children: N/A  . Years of education: N/A      Occupational History  . Not on file.         Social History Main Topics  . Smoking status: Current Some Day Smoker  . Smokeless tobacco: Never Used  . Alcohol use Yes     Comment: alcohol 3 weeks ago  . Drug use: No  . Sexual activity: Yes       Other Topics Concern  . Not on file      Social History Narrative  . No narrative on file   No current facility-administered medications on file prior to encounter.          Current Outpatient Prescriptions on File Prior to Encounter  Medication Sig Dispense Refill  . Prenatal Vit-Fe Fumarate-FA (PRENATAL VITAMIN PO) Take by mouth.    . metroNIDAZOLE (FLAGYL) 500 MG tablet Take 1 tablet (500 mg total) by mouth 2 (two) times daily. (Patient not taking: Reported on 10/15/2015) 14 tablet 0   No Known Allergies  ROS: Pertinent items in HPI  OBJECTIVE BP 123/64 (BP Location: Left  Arm)   Pulse (!) 57   Temp 98.9 F (37.2 C) (Oral)   LMP 07/15/2015 (Approximate)  CONSTITUTIONAL: Well-developed, well-nourished female in no acute distress.  HENT:  Normocephalic, atraumatic, External right and left ear normal. Oropharynx is clear and moist EYES: Conjunctivae and EOM are normal. Pupils are equal, round, and reactive to light. No scleral icterus.  NECK: Normal range of motion, supple, no masses.  Normal thyroid.  SKIN: Skin is warm and dry. No rash noted. Not diaphoretic. No erythema. No pallor. NEUROLGIC: Alert and oriented to person, place, and time. Normal reflexes, muscle tone coordination. No cranial nerve deficit noted. PSYCHIATRIC: Normal mood and affect. Normal behavior. Normal judgment and thought content. CARDIOVASCULAR: Normal heart rate noted RESPIRATORY: Effort and breath sounds normal, no problems with respiration noted. ABDOMEN: Soft, normal bowel sounds, no distention noted.  Gravid, 22wk size. No tenderness, rebound or guarding.  PELVIC: Normal appearing external genitalia; normal appearing vaginal mucosa.  Cervix is open 3 cm with BBOW, no fetal parts  noted. Copious mucous and clear discharge noted, Amnisure, wet prep and pelvic cultures obtained.  MUSCULOSKELETAL: Normal range of motion. No tenderness.  No cyanosis, clubbing, or edema.  2+ distal pulses.  LAB RESULTS LabResultsLast24Hours       Results for orders placed or performed during the hospital encounter of 12/20/15 (from the past 24 hour(s))  Urinalysis, Routine w reflex microscopic (not at [facility])     Status: Abnormal   Collection Time: 12/20/15  2:00 PM  Result Value Ref Range   Color, Urine YELLOW YELLOW   APPearance CLEAR CLEAR   Specific Gravity, Urine <1.005 (L) 1.005 - 1.030   pH 5.5 5.0 - 8.0   Glucose, UA NEGATIVE NEGATIVE mg/dL   Hgb urine dipstick NEGATIVE NEGATIVE   Bilirubin Urine NEGATIVE NEGATIVE   Ketones, ur NEGATIVE NEGATIVE mg/dL   Protein, ur NEGATIVE  NEGATIVE mg/dL   Nitrite NEGATIVE NEGATIVE   Leukocytes, UA SMALL (A) NEGATIVE  Urine microscopic-add on     Status: Abnormal   Collection Time: 12/20/15  2:00 PM  Result Value Ref Range   Squamous Epithelial / LPF 0-5 (A) NONE SEEN   WBC, UA 0-5 0 - 5 WBC/hpf   RBC / HPF NONE SEEN 0 - 5 RBC/hpf   Bacteria, UA RARE (A) NONE SEEN      IMAGING  ImagingResults  [place] Mfm Ob Comp + 14 Wk  Result Date: 11/26/2015 OBSTETRICAL ULTRASOUND: This exam was performed within a [place] [facility] Department. The OB US report was generated in the AS system, and faxed to the ordering physician.  This report is available in the [employer]. See the AS Obstetric US report via the Image Link.     ASSESSMENT 1. Incompetency, cervical     PLAN Admit and ensure no actual labor or signs of infection. No evidence of ROM at present. Begin Prometrium.    Reva Bores, MD 12/20/2015 3:02 PM

## 2015-12-20 NOTE — MAU Note (Signed)
Pt sent from [facility] by EMS, 3 cm's dilated with bulging membranes, denies bleeding or LOF.  Has yellow discharge.  Has been having uc's x 4 days, stronger after intercourse, last time was last night.

## 2015-12-20 NOTE — MAU Note (Signed)
Dr. Shawnie Pons in to see pt, Eda Royal as interpreter.

## 2015-12-21 DIAGNOSIS — O3430 Maternal care for cervical incompetence, unspecified trimester: Secondary | ICD-10-CM | POA: Diagnosis present

## 2015-12-21 LAB — GC/CHLAMYDIA PROBE AMP (~~LOC~~) NOT AT ARMC
CHLAMYDIA, DNA PROBE: NEGATIVE
NEISSERIA GONORRHEA: NEGATIVE

## 2015-12-21 NOTE — Progress Notes (Signed)
Patient ID: Lindsey Wells, female   DOB: 04-07-1981, 35 y.o.   MRN: [id number] FACULTY PRACTICE ANTEPARTUM(COMPREHENSIVE) NOTE  Lindsey Wells is a 35 y.o. G2P1001 at [redacted]w[redacted]d by best clinical estimate who is admitted for incompetent cervix.   Fetal presentation is breech. Length of Stay:  1  Days  Subjective: Less pressure. No LOF Patient reports the fetal movement as active. Patient reports uterine contraction  activity as none. Patient reports  vaginal bleeding as none. Patient describes fluid per vagina as None.  Vitals:  Blood pressure (!) 113/48, pulse 61, temperature 98.7 F (37.1 C), temperature source Oral, resp. rate 18, height 5\' 5"  (1.651 m), weight 181 lb 9.6 oz (82.4 kg), last menstrual period 07/15/2015. Physical Examination:  General appearance - alert, well appearing, and in no distress Chest - normal effort Abdomen - gravid, NT Fundal Height:  size equals dates Extremities: extremities normal, atraumatic, no cyanosis or edema  Membranes:intact  Fetal Monitoring:  Baseline: 145 bpm  Labs:  Results for orders placed or performed during the hospital encounter of 12/20/15 (from the past 24 hour(s))  Urinalysis, Routine w reflex microscopic (not at [facility])   Collection Time: 12/20/15  2:00 PM  Result Value Ref Range   Color, Urine YELLOW YELLOW   APPearance CLEAR CLEAR   Specific Gravity, Urine <1.005 (L) 1.005 - 1.030   pH 5.5 5.0 - 8.0   Glucose, UA NEGATIVE NEGATIVE mg/dL   Hgb urine dipstick NEGATIVE NEGATIVE   Bilirubin Urine NEGATIVE NEGATIVE   Ketones, ur NEGATIVE NEGATIVE mg/dL   Protein, ur NEGATIVE NEGATIVE mg/dL   Nitrite NEGATIVE NEGATIVE   Leukocytes, UA SMALL (A) NEGATIVE  Urine microscopic-add on   Collection Time: 12/20/15  2:00 PM  Result Value Ref Range   Squamous Epithelial / LPF 0-5 (A) NONE SEEN   WBC, UA 0-5 0 - 5 WBC/hpf   RBC / HPF NONE SEEN 0 - 5 RBC/hpf   Bacteria, UA RARE (A) NONE SEEN  Urine rapid drug screen (hosp  performed)   Collection Time: 12/20/15  2:00 PM  Result Value Ref Range   Opiates NONE DETECTED NONE DETECTED   Cocaine NONE DETECTED NONE DETECTED   Benzodiazepines NONE DETECTED NONE DETECTED   Amphetamines NONE DETECTED NONE DETECTED   Tetrahydrocannabinol NONE DETECTED NONE DETECTED   Barbiturates NONE DETECTED NONE DETECTED  Wet prep, genital   Collection Time: 12/20/15  2:20 PM  Result Value Ref Range   Yeast Wet Prep HPF POC NONE SEEN NONE SEEN   Trich, Wet Prep NONE SEEN NONE SEEN   Clue Cells Wet Prep HPF POC NONE SEEN NONE SEEN   WBC, Wet Prep HPF POC MODERATE BACTERIA SEEN (A) NONE SEEN   Sperm NONE SEEN   Amnisure rupture of membrane (rom)not at [facility]   Collection Time: 12/20/15  2:20 PM  Result Value Ref Range   Amnisure ROM NEGATIVE   CBC on admission   Collection Time: 12/20/15  4:41 PM  Result Value Ref Range   WBC 9.8 4.0 - 10.5 K/uL   RBC 3.87 3.87 - 5.11 MIL/uL   Hemoglobin 11.4 (L) 12.0 - 15.0 g/dL   HCT 75.1 (L) 70.0 - 17.4 %   MCV 84.2 78.0 - 100.0 fL   MCH 29.5 26.0 - 34.0 pg   MCHC 35.0 30.0 - 36.0 g/dL   RDW 94.4 96.7 - 59.1 %   Platelets 209 150 - 400 K/uL  Type and screen [facility] OF    Collection  Time: 12/20/15  4:41 PM  Result Value Ref Range   ABO/RH(D) AB POS    Antibody Screen NEG    Sample Expiration 12/23/2015   ABO/Rh   Collection Time: 12/20/15  4:47 PM  Result Value Ref Range   ABO/RH(D) AB POS     Medications:  Scheduled . docusate sodium  100 mg Oral Daily  . prenatal multivitamin  1 tablet Oral Q1200  . progesterone  200 mg Vaginal QHS   I have reviewed the patient's current medications.  ASSESSMENT: Active Problems:   Abnormal dilatation of cervix before onset of labor   Incompetent cervix in pregnancy, antepartum   PLAN: Bedrest - too late for cerclage at this point Trendelenberg NICU consult Prometrium BMZ 22 5/7 wks  Baruc Tugwell S, MD 12/21/2015,7:45 AM

## 2015-12-21 NOTE — Progress Notes (Signed)
I ordered pt meals, by Orlan Leavens, Spanish Interpreter.

## 2015-12-21 NOTE — Progress Notes (Signed)
I assisted Dr. Shawnie Pons with explanation of care plan and Felicia,RN with questions. Eda H Royal Interpreter.

## 2015-12-22 DIAGNOSIS — O3432 Maternal care for cervical incompetence, second trimester: Principal | ICD-10-CM

## 2015-12-22 NOTE — Progress Notes (Signed)
I stopped by to check on patient's needs.  Eda H Royal Interpreter. °

## 2015-12-22 NOTE — Progress Notes (Signed)
FACULTY PRACTICE ANTEPARTUM(COMPREHENSIVE) NOTE  Lindsey Wells is a 35 y.o. G2P1001 at [redacted]w[redacted]d by best clinical estimate who is admitted for incompetent cervix, premature dilation.   Fetal presentation is breech. Length of Stay:  2  Days  Subjective: Vaginal discharge Patient reports the fetal movement as active. Patient reports uterine contraction  activity as none. Patient reports  vaginal bleeding as none. Patient describes fluid per vagina as None.  Vitals:  Blood pressure (!) 104/48, pulse 67, temperature 98.7 F (37.1 C), temperature source Oral, resp. rate 16, height 5\' 5"  (1.651 m), weight 82.4 kg (181 lb 9.6 oz), last menstrual period 07/15/2015. Physical Examination:  General appearance - alert, well appearing, and in no distress Heart - normal rate and regular rhythm Abdomen - soft, nontender, nondistended Fundal Height:  size equals dates Cervical Exam: Not evaluated. . Extremities: extremities normal, atraumatic, no cyanosis or edema and Homans sign is negative, no sign of DVT  Membranes:intact  Fetal Monitoring:   Fetal Heart Rate A  Mode Doppler filed at 12/21/2015 2145  Baseline Rate (A) 158 bpm filed at 12/21/2015 2145     Labs:  No results found for this or any previous visit (from the past 24 hour(s)).  Imaging Studies:     Currently EPIC will not allow sonographic studies to automatically populate into notes.  In the meantime, copy and paste results into note or free text.  Medications:  Scheduled . docusate sodium  100 mg Oral Daily  . prenatal multivitamin  1 tablet Oral Q1200  . progesterone  200 mg Vaginal QHS   I have reviewed the patient's current medications.  ASSESSMENT: Patient Active Problem List   Diagnosis Date Noted  . Incompetent cervix in pregnancy, antepartum 12/21/2015  . Abnormal dilatation of cervix before onset of labor 12/20/2015  . Pregnancy, location unknown 09/02/2015    PLAN: Expectant management with patient   In trendelenburg position, watch for signs of PTL or VB or ROM  Undrea Archbold 12/22/2015,7:01 AM

## 2015-12-22 NOTE — Progress Notes (Signed)
I check patient needs I ordered her meals, by Orlan Leavens Spanish Interpreter.

## 2015-12-22 NOTE — Progress Notes (Signed)
Pt was in good spirits and stated that she is doing better today than yesterday.  She said that her husband is very supportive and helpful and that he will be here after work.  She requested prayer for her baby which I gladly offered.    Chaplain Dyanne Carrel, Bcc Pager, [phone] 10:36 AM    12/22/15 1000  Clinical Encounter Type  Visited With Patient  Visit Type Spiritual support  Stress Factors  Patient Stress Factors Loss of control

## 2015-12-22 NOTE — Progress Notes (Signed)
OB Note 12/22/2015 1234 PM  Long d/w patient with interpreter Eda re: plan of care. She denies any VB, or PTL s/s D/w her that will can start BMZ at 22/5, get a growth at 22/6 or 23/0 and then will have NICU d/w her at 23/0 re: interventions after long d/w re: r/b/a at 23wk resuscitation and then we will d/w her re: if delivery were to happen how we can intervene from the [facility] perspective since she is breech (ie needing a classical c-section).  I d/w her that [facility] will do full resuscitation, per patient's wishes, at 22/0-22/6 weeks and I d/w her what that may mean and the extremely low chance of survival and increased maternal risks and neonate risk if the fetus were to survive. Patient amenable to staying with [place].  Continue with bedrest, trendelenburg, SCDs and daily FHTs and continue to assess for PTL s/s BMZ at 22/5-23/0 Growth scan for 22/6-23/0 and then d/w pt and NICU together regarding plan of care and intervention measures Continue with vaginal qhs prometrium  Cornelia Copa MD Attending [facility])

## 2015-12-23 ENCOUNTER — Encounter (HOSPITAL_COMMUNITY): Payer: Self-pay

## 2015-12-23 LAB — TYPE AND SCREEN
ABO/RH(D): AB POS
Antibody Screen: NEGATIVE

## 2015-12-23 MED ORDER — BETAMETHASONE SOD PHOS & ACET 6 (3-3) MG/ML IJ SUSP
12.0000 mg | Freq: Every day | INTRAMUSCULAR | Status: AC
Start: 2015-12-24 — End: 2015-12-25
  Administered 2015-12-24 – 2015-12-25 (×2): 12 mg via INTRAMUSCULAR
  Filled 2015-12-23 (×2): qty 2

## 2015-12-23 NOTE — Progress Notes (Addendum)
Patient ID: Lindsey Wells, female   DOB: August 29, 1980, 35 y.o.   MRN: [id number] FACULTY PRACTICE ANTEPARTUM(COMPREHENSIVE) NOTE  Lindsey Wells is a 35 y.o. G2P1001 at [redacted]w[redacted]d  who is admitted for cervical incompetence with bulging BOW. Denies contractions.   Fetal presentation is breech. Length of Stay:  3  Days  Subjective: Pt denies contractions, tolerating trendellenburg position well Patient reports the fetal movement as active. Patient reports uterine contraction  activity as none. Patient reports  vaginal bleeding as none. Patient describes fluid per vagina as Other mucus, not watery.  Vitals:  Blood pressure (!) 99/53, pulse 63, temperature 97.9 F (36.6 C), temperature source Oral, resp. rate 16, height 5\' 5"  (1.651 m), weight 82.4 kg (181 lb 9.6 oz), last menstrual period 07/15/2015. Physical Examination:  General appearance - alert, well appearing, and in no distress, oriented to person, place, and time, overweight and anxious Heart - normal rate and regular rhythm Abdomen - soft, nontender, nondistended Fundal Height:  size equals dates Cervical Exam: Not evaluated. and found to be fetal presentation is breech. Extremities: extremities normal, atraumatic, no cyanosis or edema and Homans sign is negative, no sign of DVT with DTRs 2+ bilaterally Membranes:intact  Fetal Monitoring:  Baseline: only monitoring for contractions, none seen bpm  Labs:  No results found for this or any previous visit (from the past 24 hour(s)).  Imaging Studies:     Currently EPIC will not allow sonographic studies to automatically populate into notes.  In the meantime, copy and paste results into note or free text.  Medications:  Scheduled . docusate sodium  100 mg Oral Daily  . prenatal multivitamin  1 tablet Oral Q1200  . progesterone  200 mg Vaginal QHS   I have reviewed the patient's current medications.  ASSESSMENT: Patient Active Problem List   Diagnosis Date Noted   . Incompetent cervix in pregnancy, antepartum 12/21/2015  . Abnormal dilatation of cervix before onset of labor 12/20/2015  . Pregnancy, location unknown 09/02/2015    PLAN: Continue trendelenburg position, Prometrium, add BMZ at 22w5 and 22w6, on 7/28, 7/29 Neonatology consult today Paiten Boies V 12/23/2015,6:36 AM

## 2015-12-23 NOTE — Progress Notes (Signed)
I stopped by to check on patient's needs.  Lindsey Wells Interpreter. °

## 2015-12-24 NOTE — Progress Notes (Signed)
Patient ID: Lindsey Wells, female   DOB: 05-22-1981, 35 y.o.   MRN: [id number] FACULTY PRACTICE ANTEPARTUM(COMPREHENSIVE) NOTE  Lindsey Wells is a 35 y.o. G2P1001 at 105w5d  who is admitted for cervical incompetence with bulging BOW. Denies contractions.   Fetal presentation is breech. Length of Stay:  4  Days  Subjective: Pt denies contractions, tolerating trendellenburg position well Patient reports the fetal movement as active. Patient reports uterine contraction  activity as none. Patient reports  vaginal bleeding as none. Patient describes fluid per vagina as Other mucus, not watery.  Vitals:  Blood pressure (!) 99/55, pulse 73, temperature 98.3 F (36.8 C), temperature source Oral, resp. rate 16, height 5\' 5"  (1.651 m), weight 181 lb 9.6 oz (82.4 kg), last menstrual period 07/15/2015, SpO2 100 %. Physical Examination:  General appearance - alert, well appearing, and in no distress, overweight and anxious Heart - normal rate and regular rhythm Abdomen - soft, nontender, nondistended Fundal Height:  size equals dates Cervical Exam: Not evaluated.  Extremities: extremities normal, atraumatic, no cyanosis or edema and Homans sign is negative, no sign of DVT with DTRs 2+ bilaterally Membranes:intact  Fetal Monitoring:  Heart tones on doppler 150's  Labs:  Results for orders placed or performed during the hospital encounter of 12/20/15 (from the past 24 hour(s))  Type and screen [facility]   Collection Time: 12/23/15  4:07 PM  Result Value Ref Range   ABO/RH(D) AB POS    Antibody Screen NEG    Sample Expiration 12/26/2015     Imaging Studies:     Currently EPIC will not allow sonographic studies to automatically populate into notes.  In the meantime, copy and paste results into note or free text.  Medications:  Scheduled . betamethasone acetate-betamethasone sodium phosphate  12 mg Intramuscular Q0200  . docusate sodium  100 mg Oral Daily   . prenatal multivitamin  1 tablet Oral Q1200  . progesterone  200 mg Vaginal QHS   I have reviewed the patient's current medications.  ASSESSMENT: Patient Active Problem List   Diagnosis Date Noted  . Incompetent cervix in pregnancy, antepartum 12/21/2015  . Abnormal dilatation of cervix before onset of labor 12/20/2015  . Pregnancy, location unknown 09/02/2015    PLAN: 35 yo G2P1001 at [redacted]w[redacted]d with incompetent cervix and advance cervical dilatation  Start Betamethasone for 2 doses today Continue prometrium Continue current antepartum care  Quindon Denker 12/24/2015,7:40 AM

## 2015-12-24 NOTE — Progress Notes (Signed)
Interpreter at bedside with RN for administration of BMZ

## 2015-12-24 NOTE — Progress Notes (Signed)
This note also relates to the following rows which could not be included: BP - Cannot attach notes to unvalidated device data Pulse Rate - Cannot attach notes to unvalidated device data Rn in room with interpreter for breakfast order.  Pt denies any needs at this time.

## 2015-12-24 NOTE — Progress Notes (Signed)
I assisted Dr Constant with explanation of care plan. Eda H Royal Interpreter. 

## 2015-12-25 NOTE — Progress Notes (Addendum)
Patient ID: Lindsey Wells, female   DOB: 01-11-1981, 35 y.o.   MRN: [id number] FACULTY PRACTICE ANTEPARTUM(COMPREHENSIVE) NOTE  Lindsey Wells is a 35 y.o. G2P1001 at [redacted]w[redacted]d  who is admitted for cervical incompetence with bulging BOW. Denies contractions.   Fetal presentation is breech. Length of Stay:  5  Days  Subjective: Patient is Spanish-speaking only, Spanish interpreter present for this encounter. Pt denies contractions, tolerating trendellenburg position well Patient reports the fetal movement as active. Patient reports uterine contraction  activity as none. Patient reports  vaginal bleeding as none. Patient describes fluid per vagina as Other mucus, not watery.  Vitals:  Blood pressure (!) 106/53, pulse 62, temperature 98.6 F (37 C), temperature source Oral, resp. rate 20, height 5\' 5"  (1.651 m), weight 181 lb 9.6 oz (82.4 kg), last menstrual period 07/15/2015, SpO2 100 %. Physical Examination:  General appearance - alert, well appearing, and in no distress, overweight and anxious Heart - normal rate and regular rhythm Abdomen - soft, nontender, nondistended Fundal Height:  size equals dates Cervical Exam: Not evaluated.  Extremities: extremities normal, atraumatic, no cyanosis or edema and Homans sign is negative, no sign of DVT with DTRs 2+ bilaterally Membranes:intact  Fetal Monitoring:  Heart tones on doppler 150's  Labs:  No results found for this or any previous visit (from the past 24 hour(s)).  Imaging Studies:     Currently EPIC will not allow sonographic studies to automatically populate into notes.  In the meantime, copy and paste results into note or free text.  Medications:  Scheduled . docusate sodium  100 mg Oral Daily  . prenatal multivitamin  1 tablet Oral Q1200  . progesterone  200 mg Vaginal QHS   I have reviewed the patient's current medications.  ASSESSMENT: Patient Active Problem List   Diagnosis Date Noted  . Incompetent  cervix in pregnancy, antepartum 12/21/2015  . Abnormal dilatation of cervix before onset of labor 12/20/2015  . Pregnancy, location unknown 09/02/2015    PLAN: 35 yo G2P1001 at [redacted]w[redacted]d with incompetent cervix and advance cervical dilatation  Complete Betamethasone regimen today Continue Prometrium and Trendelenberg Follow up TV scan ordered for 12/27/15 at [facility]; will evaluate for other possible intervention pending results. Continue current antepartum care  Toriann Spadoni A, MD 12/25/2015,8:39 AM

## 2015-12-25 NOTE — Progress Notes (Signed)
Intrepreter in to see patient.

## 2015-12-26 ENCOUNTER — Other Ambulatory Visit (HOSPITAL_COMMUNITY): Payer: Self-pay | Admitting: Radiology

## 2015-12-26 LAB — TYPE AND SCREEN
ABO/RH(D): AB POS
Antibody Screen: NEGATIVE

## 2015-12-26 NOTE — Progress Notes (Signed)
Patient ID: Lindsey Wells, female   DOB: Sep 06, 1980, 35 y.o.   MRN: [id number] FACULTY PRACTICE ANTEPARTUM(COMPREHENSIVE) NOTE  Lindsey Wells is a 35 y.o. G2P1001 at [redacted]w[redacted]d  who is admitted for cervical incompetence with bulging BOW. Denies contractions.   Fetal presentation is breech. Length of Stay:  6  Days  Subjective: Patient is Spanish-speaking only, Spanish interpreter present for this encounter. Pt denies contractions, tolerating trendellenburg position well Patient reports the fetal movement as active. Patient reports uterine contraction  activity as none. Patient reports  vaginal bleeding as none. Patient describes fluid per vagina as Other mucus, not watery.  Vitals:  Blood pressure (!) 96/49, pulse (!) 56, temperature 99.6 F (37.6 C), temperature source Oral, resp. rate 18, height 5\' 5"  (1.651 m), weight 181 lb 9.6 oz (82.4 kg), last menstrual period 07/15/2015, SpO2 100 %. Physical Examination:  General appearance - alert, well appearing, and in no distress, overweight and anxious Heart - normal rate and regular rhythm Lungs - CTA bilaterally Abdomen - soft, nontender, nondistended Fundal Height:  size equals dates Cervical Exam: Not evaluated.  Extremities: extremities normal, atraumatic, no cyanosis or edema and Homans sign is negative, no sign of DVT with DTRs 2+ bilaterally Membranes:intact  Fetal Monitoring:  Heart tones on doppler 150's  Labs:  No results found for this or any previous visit (from the past 24 hour(s)).  Imaging Studies:     Currently EPIC will not allow sonographic studies to automatically populate into notes.  In the meantime, copy and paste results into note or free text.  Medications:  Scheduled . docusate sodium  100 mg Oral Daily  . prenatal multivitamin  1 tablet Oral Q1200  . progesterone  200 mg Vaginal QHS   I have reviewed the patient's current medications.  ASSESSMENT: Patient Active Problem List   Diagnosis  Date Noted  . Incompetent cervix in pregnancy, antepartum 12/21/2015  . Abnormal dilatation of cervix before onset of labor 12/20/2015  . Pregnancy, location unknown 09/02/2015    PLAN: 1.  Incompetent cervix:  S/p BMZ x2  [place] tomorrow to reevaluate cervix/membranes. ? Possible pessary.  Continue prometrium  2.  23 w gestation  Continue current antepartum care  Reassuring dopplers   Levie Heritage, DO 12/26/2015,7:19 AM

## 2015-12-27 ENCOUNTER — Inpatient Hospital Stay (HOSPITAL_COMMUNITY): Payer: Medicaid Other

## 2015-12-27 DIAGNOSIS — Z3A23 23 weeks gestation of pregnancy: Secondary | ICD-10-CM

## 2015-12-27 NOTE — Progress Notes (Signed)
Patient ID: Lindsey Wells, female   DOB: 1980/09/05, 35 y.o.   MRN: [id number] FACULTY PRACTICE ANTEPARTUM(COMPREHENSIVE) NOTE  Lindsey Wells is a 35 y.o. G2P1001 at [redacted]w[redacted]d  who is admitted for cervical incompetence with bulging BOW. Denies contractions.   Fetal presentation is breech. Length of Stay:  7  Days  Subjective: Patient is Spanish-speaking only, Spanish interpreter present for this encounter. Pt denies contractions, tolerating trendellenburg position well Patient reports the fetal movement as active. Patient reports uterine contraction  activity as none. Patient reports  vaginal bleeding as none. Patient describes fluid per vagina as Other mucus, not watery.  Vitals:  Blood pressure (!) 102/52, pulse (!) 53, temperature 99.5 F (37.5 C), temperature source Oral, resp. rate 18, height 5\' 5"  (1.651 m), weight 181 lb 9.6 oz (82.4 kg), last menstrual period 07/15/2015, SpO2 100 %. Physical Examination:  General appearance - alert, well appearing, and in no distress, overweight and anxious Heart - normal rate and regular rhythm Lungs - CTA bilaterally Abdomen - soft, nontender, nondistended Fundal Height:  size equals dates Cervical Exam: Not evaluated.  Extremities: extremities normal, atraumatic, no cyanosis or edema and Homans sign is negative, no sign of DVT with DTRs 2+ bilaterally Membranes:intact  Fetal Monitoring:  Heart tones on doppler 150's  Labs:  Results for orders placed or performed during the hospital encounter of 12/20/15 (from the past 24 hour(s))  Type and screen [facility] [place]   Collection Time: 12/26/15  3:06 PM  Result Value Ref Range   ABO/RH(D) AB POS    Antibody Screen NEG    Sample Expiration 12/29/2015     Imaging Studies:     Currently EPIC will not allow sonographic studies to automatically populate into notes.  In the meantime, copy and paste results into note or free text.  Medications:  Scheduled .  docusate sodium  100 mg Oral Daily  . prenatal multivitamin  1 tablet Oral Q1200  . progesterone  200 mg Vaginal QHS   I have reviewed the patient's current medications.  ASSESSMENT: Patient Active Problem List   Diagnosis Date Noted  . Incompetent cervix in pregnancy, antepartum 12/21/2015  . Abnormal dilatation of cervix before onset of labor 12/20/2015  . Pregnancy, location unknown 09/02/2015    PLAN: 1.  Incompetent cervix:  S/p BMZ x2  MMF consult today for possible pessary placement.  Continue prometrium  2.  33 w gestation  Continue current antepartum care  Reassuring dopplers   Catalina Antigua, MD 12/27/2015,9:36 AM

## 2015-12-27 NOTE — Progress Notes (Signed)
Addendum:  See previous consult note - given strict bedrest, would offer either Lovenox prophylaxis or pneumatic compression hose while inpatient due to DVT risk.  Feel that Trendelenburg is unlikely to have any significant impact on the outcome and would feel comfortable discontinuing this intervention.  Alpha Gula, MD

## 2015-12-27 NOTE — Progress Notes (Signed)
Initial Nutrition Assessment  DOCUMENTATION CODES:  Obesity unspecified INTERVENTION:  Regular diet  NUTRITION DIAGNOSIS:  Increased nutrient needs related to  (pregnancy and fetal growth requirements) as evidenced by  (23 weeks IUP).  GOAL:  Patient will meet greater than or equal to 90% of their needs  MONITOR:  Weight trends  REASON FOR ASSESSMENT:  Antenatal    ASSESSMENT:  23 1/7 weeks, cervical incompitence, Current weight same as weight at 9 weeks, no weight gain. BMI 30.2  Diet Order:  Diet regular Room service appropriate? Yes; Fluid consistency: Thin  Height:   Ht Readings from Last 1 Encounters:  12/20/15 5\' 5"  (1.651 m)   Weight:   Wt Readings from Last 1 Encounters:  12/20/15 181 lb 9.6 oz (82.4 kg)   Ideal Body Weight:    125 lbs  BMI:  Body mass index is 30.22 kg/m.  145% of IBW  Estimated Nutritional Needs:   Kcal:  2000-2200  Protein:  90-100 g  Fluid:  2.3 L  EDUCATION NEEDS:   No education needs identified at this time  Inez Pilgrim.Odis Luster LDN Neonatal Nutrition Support Specialist/RD III Pager [phone]      Phone [phone]

## 2015-12-27 NOTE — Progress Notes (Signed)
I assisted Dr Constant with explanation of care plan. Eda H Royal Interpreter. 

## 2015-12-27 NOTE — Consult Note (Signed)
Maternal Fetal Medicine Consultation  Requesting Provider(s): Catalina Antigua, MD  Primary [facility]: [facility]  Reason for consultation: Cervical insufficiency, ? Candidate for pessary  HPI: Lindsey Wells is a 35 yo G2P1001 currently at 23w 1d admitted with cervical insufficiency and advanced cervical dilation.  The patient reported increased pelvic pressure/ vaginal discharge prior to her admission last week.  She completed a course of betamethasone and has been fairly stable on bedrest despite advanced cervical dilation / hour glassing membranes.  She reports some vaginal bleeding in early pregnancy, but otherwise denies any prenatal complications.  The fetus is active and she denies uterine contractions, leakage of fluid or continued vaginal bleeding.  OB History: OB History    Gravida Para Term Preterm AB Living   2 1 1     1    SAB TAB Ectopic Multiple Live Births                  PMH:  Past Medical History:  Diagnosis Date  . Medical history non-contributory     PSH:  Past Surgical History:  Procedure Laterality Date  . NO PAST SURGERIES     Meds:  No current facility-administered medications on file prior to encounter.    Current Outpatient Prescriptions on File Prior to Encounter  Medication Sig Dispense Refill  . Prenatal Vit-Fe Fumarate-FA (PRENATAL VITAMIN PO) Take by mouth.    . metroNIDAZOLE (FLAGYL) 500 MG tablet Take 1 tablet (500 mg total) by mouth 2 (two) times daily. (Patient not taking: Reported on 10/15/2015) 14 tablet 0   Allergies: No Known Allergies   FH: History reviewed. No pertinent family history.   Soc:  Social History   Social History  . Marital status: Single    Spouse name: N/A  . Number of children: N/A  . Years of education: N/A   Occupational History  . Not on file.   Social History Main Topics  . Smoking status: Current Some Day Smoker  . Smokeless tobacco: Never Used  . Alcohol use Yes     Comment: alcohol 3 weeks  ago  . Drug use: No  . Sexual activity: Yes   Other Topics Concern  . Not on file   Social History Narrative  . No narrative on file     Review of Systems: no vaginal bleeding or cramping/contractions, no LOF, no nausea/vomiting. All other systems reviewed and are negative.  PE:   Vitals:   12/26/15 2356 12/27/15 0937  BP: (!) 102/52 (!) 101/51  Pulse: (!) 53 (!) 59  Resp: 18 18  Temp: 99.5 F (37.5 C) 98.2 F (36.8 C)   WDWN gravid female in no acute distress Abd: soft, non tender, gravid  Ultrasound: Single IUP at 23w 1d Limited ultrasound performed to evaluate cervix  TVUS - no measurable cervix.  Bulging bag / fetal membranes noted in the vagina.  The cervix is dilated ~ 3-4 cm (post void)  Labs: CBC    Component Value Date/Time   WBC 9.8 12/20/2015 1641   RBC 3.87 12/20/2015 1641   HGB 11.4 (L) 12/20/2015 1641   HCT 32.6 (L) 12/20/2015 1641   PLT 209 12/20/2015 1641   MCV 84.2 12/20/2015 1641   MCH 29.5 12/20/2015 1641   MCHC 35.0 12/20/2015 1641   RDW 13.3 12/20/2015 1641   LYMPHSABS 3.7 08/29/2015 0032   MONOABS 0.7 08/29/2015 0032   EOSABS 0.1 08/29/2015 0032   BASOSABS 0.0 08/29/2015 0032     A/P: 1) Single  IUP at 23w 1d  2) Cervical insufficiency/ advanced cervical dilation with hour-glassing membranes into the vagina - given advanced cervical dilation, do not feel that the patient is a candidate for cervical pessary.  Feel that she will likely either experience premature rupture of membranes or go into active labor soon - although I am encouraged by the fact that she has been able to complete a course of betamethasone and does not appear to have had worsening cervical dilation.  Recommend NICU consultation.  She will also need further counseling on risks / benefits of what would likely be a classical C-section given footling breech presentation.   Thank you for the opportunity to be a part of the care of [employer]. Please contact our  office if we can be of further assistance.   I spent approximately 40 minutes with this patient with over 50% of time spent in face-to-face counseling.  Alpha Gula, MD Maternal Fetal Medicine

## 2015-12-28 NOTE — Progress Notes (Signed)
I assisted Educational psychologist with some questions, by Orlan Leavens Spanish Interpreter.

## 2015-12-28 NOTE — Progress Notes (Signed)
I assisted Dr. Mikle Bosworth with explanation of care plan for the Baby. Eda H Royal  Interpreter.

## 2015-12-28 NOTE — Progress Notes (Addendum)
Patient ID: Lindsey Wells, female   DOB: [birth year], 35 y.o.   MRN: [id number] FACULTY PRACTICE ANTEPARTUM(COMPREHENSIVE) NOTE  Lindsey Wells is a 35 y.o. G2P1001 at [redacted]w[redacted]d  who is admitted for cervical incompetence with bulging BOW. Denies contractions.   Fetal presentation is breech. Length of Stay:  8  Days  Subjective: Patient is Spanish-speaking only, Spanish interpreter present for this encounter. Pt denies contractions, tolerating [place] position well Patient reports the fetal movement as active. Patient reports uterine contraction  activity as none. Patient reports  vaginal bleeding as none. Patient describes fluid per vagina as Other mucus, not watery.  Vitals:  Blood pressure (!) 111/56, pulse (!) 57, temperature 98.8 F (37.1 C), temperature source Oral, resp. rate 16, height 5\' 5"  (1.651 m), weight 181 lb 9.6 oz (82.4 kg), last menstrual period 07/15/2015, SpO2 100 %. Physical Examination:  General appearance - alert, well appearing, and in no distress, overweight and anxious Heart - normal rate and regular rhythm Lungs - CTA bilaterally Abdomen - soft, nontender, nondistended Fundal Height:  size equals dates Cervical Exam: Not evaluated.  Extremities: extremities normal, atraumatic, no cyanosis or edema and Homans sign is negative, no sign of DVT with DTRs 2+ bilaterally Membranes:intact  Fetal Monitoring:  Heart tones on doppler 140's  Labs:  No results found for this or any previous visit (from the past 24 hour(s)).  Imaging Studies:     Currently EPIC will not allow sonographic studies to automatically populate into notes.  In the meantime, copy and paste results into note or free text.  Medications:  Scheduled . docusate sodium  100 mg Oral Daily  . prenatal multivitamin  1 tablet Oral Q1200  . progesterone  200 mg Vaginal QHS   I have reviewed the patient's current medications.  ASSESSMENT: Patient Active Problem List   Diagnosis  Date Noted  . Incompetent cervix in pregnancy, antepartum 12/21/2015  . Abnormal dilatation of cervix before onset of labor 12/20/2015  . Pregnancy, location unknown 09/02/2015    PLAN: 1.  Incompetent cervix:  S/p BMZ x2  MMF consult yesterday- Patient is not a candidate for pessary placement.  Continue prometrium   NICU consult today  2.  23 w gestation  Continue current antepartum care  Reassuring dopplers   Catalina Antigua, MD 12/28/2015,6:19 AM

## 2015-12-28 NOTE — Consult Note (Addendum)
Asked by Dr Jolayne Panther to speak to Ms Lindsey Wells to discuss outcome of extreme preterm pregnancy at 23  weeks. Chart reviewed. She is 23 2/7 weeks, with bulging membranes in breech presentation.  She has received 2 doses of betamethasone and is currently on bed rest.  I spoke to Ms Lindsey Wells in her room with [facility] translating.  I discussed the poor survival rate if the baby was born at 23 weeks with high incidence of developmental impairment. I talked about pulmonary and total body  Immaturity with necessity of prolonged  vent support and HAL. I discussed markedly improved survival and outcome at or after 24 wks and stressed the importance of bed rest as she was asking if she was going home today. I mentioned the positives to encourage her as she appeared tense during the conversation.  She desires full resuscitation if the baby is to be born at this time.   I answered her questions to her satisfaction.   I spent 40 minutes with this consult, more than 50% of the time was with face-to-face counseling with Ms Lindsey Wells.   Lucillie Garfinkel, MD  Neonatologist

## 2015-12-29 LAB — TYPE AND SCREEN
ABO/RH(D): AB POS
Antibody Screen: NEGATIVE

## 2015-12-29 NOTE — Progress Notes (Signed)
Patient ID: Lindsey Wells, female   DOB: 10/28/1980, 35 y.o.   MRN: [id number] FACULTY PRACTICE ANTEPARTUM(COMPREHENSIVE) NOTE  Lindsey Wells is a 35 y.o. G2P1001 at [redacted]w[redacted]d by early ultrasound who is admitted for PCD PCE.   Fetal presentation is breech. Length of Stay:  9  Days  Subjective: Spoke to neonatologist yesterday  Patient reports the fetal movement as active. Patient reports uterine contraction  activity as none. Patient reports  vaginal bleeding as none. Patient describes fluid per vagina as None.  Vitals:  Blood pressure (!) 112/54, pulse 65, temperature 98.8 F (37.1 C), temperature source Oral, resp. rate 18, height 5\' 5"  (1.651 m), weight 82.4 kg (181 lb 9.6 oz), last menstrual period 07/15/2015, SpO2 100 %. Physical Examination:  General appearance - alert, well appearing, and in no distress Heart - normal rate and regular rhythm Abdomen - soft, nontender, nondistended Fundal Height:  size equals dates Cervical Exam: Not evaluated.  Extremities: extremities normal, atraumatic, no cyanosis or edema and Homans sign is negative, no sign of DVT Membranes:intact  Labs:  No results found for this or any previous visit (from the past 24 hour(s)).  Imaging Studies:     Currently EPIC will not allow sonographic studies to automatically populate into notes.  In the meantime, copy and paste results into note or free text.  Medications:  Scheduled . docusate sodium  100 mg Oral Daily  . prenatal multivitamin  1 tablet Oral Q1200  . progesterone  200 mg Vaginal QHS   I have reviewed the patient's current medications.  ASSESSMENT: Patient Active Problem List   Diagnosis Date Noted  . Incompetent cervix in pregnancy, antepartum 12/21/2015  . Abnormal dilatation of cervix before onset of labor 12/20/2015  . Pregnancy, location unknown 09/02/2015    PLAN: Continue bedrest  ARNOLD,JAMES 12/29/2015,7:49 AM

## 2015-12-29 NOTE — Progress Notes (Signed)
I assisted  Dr. Arnold with questions.  Lindsey Wells  Interpreter. °

## 2015-12-30 NOTE — Progress Notes (Signed)
Patient ID: Lindsey Wells, female   DOB: September 16, 1980, 35 y.o.   MRN: [id number] FACULTY PRACTICE ANTEPARTUM(COMPREHENSIVE) NOTE  Lindsey Wells is a 35 y.o. G2P1001 at [redacted]w[redacted]d  who is admitted for cervical incompetence with bulging BOW, too exposed for pessary, .   Fetal presentation is breech. S/p BMZ . Length of Stay:  10  Days  Subjective: No changes in status Patient reports the fetal movement as active. Patient reports uterine contraction  activity as none. Patient reports  vaginal bleeding as none. Patient describes fluid per vagina as None.  Vitals:  Blood pressure (!) 102/47, pulse 63, temperature 98.1 F (36.7 C), temperature source Oral, resp. rate 20, height 5\' 5"  (1.651 m), weight 82.4 kg (181 lb 9.6 oz), last menstrual period 07/15/2015, SpO2 100 %. Physical Examination:  General appearance - alert, well appearing, and in no distress, oriented to person, place, and time and overweight Heart - normal rate and regular rhythm Abdomen - soft, nontender, nondistended Fundal Height:  size equals dates Cervical Exam: Not evaluated. and  Extremities: extremities normal, atraumatic, no cyanosis or edema and Homans sign is negative, no sign of DVT with DTRs 2+ bilaterally Membranes:intact Bm's are soft, daily.  Fetal Monitoring:  Doppler confirmation bid only  Labs:  Results for orders placed or performed during the Wells encounter of 12/20/15 (from the past 24 hour(s))  Type and screen Lindsey Wells OF [place]   Collection Time: 12/29/15  2:18 PM  Result Value Ref Range   ABO/RH(D) AB POS    Antibody Screen NEG    Sample Expiration 01/01/2016     Imaging Studies:     Currently EPIC will not allow sonographic studies to automatically populate into notes.  In the meantime, copy and paste results into note or free text.  Medications:  Scheduled . docusate sodium  100 mg Oral Daily  . prenatal multivitamin  1 tablet Oral Q1200  . progesterone  200 mg  Vaginal QHS   I have reviewed the patient's current medications.  ASSESSMENT: Patient Active Problem List   Diagnosis Date Noted  . Incompetent cervix in pregnancy, antepartum 12/21/2015  . Abnormal dilatation of cervix before onset of labor 12/20/2015  . Pregnancy, location unknown 09/02/2015    PLAN: Continue inpt status  Confirm presentation if srom, would plan cesarean delivery if still breech.  Lindsey Wells V 12/30/2015,7:49 AM

## 2015-12-31 NOTE — Progress Notes (Signed)
Patient ID: Lindsey Wells, female   DOB: September 04, 1980, 35 y.o.   MRN: [id number] FACULTY PRACTICE ANTEPARTUM(COMPREHENSIVE) NOTE  Lindsey Wells is a 35 y.o. G2P1001 at [redacted]w[redacted]d by early ultrasound who is admitted for Cervical insufficiency.   Fetal presentation is breech. Length of Stay:  11  Days  Subjective:  Patient reports the fetal movement as active. Patient reports uterine contraction  activity as none. Patient reports  vaginal bleeding as none. Patient describes fluid per vagina as None.  Vitals:  Blood pressure (!) 106/50, pulse 76, temperature 98.1 F (36.7 C), temperature source Oral, resp. rate 20, height 5\' 5"  (1.651 m), weight 181 lb 9.6 oz (82.4 kg), last menstrual period 07/15/2015, SpO2 100 %. Physical Examination:  General appearance - alert, well appearing, and in no distress Heart - normal rate and regular rhythm Abdomen - soft, nontender, nondistended Fundal Height:  size equals dates Cervical Exam: Not evaluated.  Extremities: extremities normal, atraumatic, no cyanosis or edema and Homans sign is negative, no sign of DVT SCD in place Membranes:intact  Fetal Monitoring:   Fetal Heart Rate A  Mode Doppler filed at 12/31/2015 0804  Baseline Rate (A) 135 bpm filed at 12/31/2015 0804  Variability 6-25 BPM filed at 12/30/2015 2213  Accelerations None filed at 12/30/2015 2213  Decelerations None filed at 12/30/2015 2213      Imaging Studies:     Currently EPIC will not allow sonographic studies to automatically populate into notes.  In the meantime, copy and paste results into note or free text.  Medications:  Scheduled . docusate sodium  100 mg Oral Daily  . prenatal multivitamin  1 tablet Oral Q1200  . progesterone  200 mg Vaginal QHS   I have reviewed the patient's current medications.  ASSESSMENT: Patient Active Problem List   Diagnosis Date Noted  . Incompetent cervix in pregnancy, antepartum 12/21/2015  . Abnormal dilatation of  cervix before onset of labor 12/20/2015  . Pregnancy, location unknown 09/02/2015    PLAN: Continue management with bedrest in hospital   Annett Boxwell 12/31/2015,9:53 AM

## 2016-01-01 ENCOUNTER — Inpatient Hospital Stay (HOSPITAL_COMMUNITY): Payer: Medicaid Other | Admitting: Anesthesiology

## 2016-01-01 ENCOUNTER — Encounter (HOSPITAL_COMMUNITY)
Admission: AD | Disposition: A | Discharge status: Home / Self Care | Payer: Self-pay | Source: Ambulatory Visit | Attending: Family Medicine

## 2016-01-01 DIAGNOSIS — O99334 Smoking (tobacco) complicating childbirth: Secondary | ICD-10-CM

## 2016-01-01 DIAGNOSIS — O411220 Chorioamnionitis, second trimester, not applicable or unspecified: Secondary | ICD-10-CM

## 2016-01-01 DIAGNOSIS — O321XX0 Maternal care for breech presentation, not applicable or unspecified: Secondary | ICD-10-CM

## 2016-01-01 DIAGNOSIS — O3432 Maternal care for cervical incompetence, second trimester: Secondary | ICD-10-CM

## 2016-01-01 DIAGNOSIS — O42913 Preterm premature rupture of membranes, unspecified as to length of time between rupture and onset of labor, third trimester: Secondary | ICD-10-CM

## 2016-01-01 DIAGNOSIS — Z3A22 22 weeks gestation of pregnancy: Secondary | ICD-10-CM

## 2016-01-01 LAB — CBC
HEMATOCRIT: 34.5 % — AB (ref 36.0–46.0)
Hemoglobin: 12.4 g/dL (ref 12.0–15.0)
MCH: 30.3 pg (ref 26.0–34.0)
MCHC: 35.9 g/dL (ref 30.0–36.0)
MCV: 84.4 fL (ref 78.0–100.0)
PLATELETS: 251 10*3/uL (ref 150–400)
RBC: 4.09 MIL/uL (ref 3.87–5.11)
RDW: 13.1 % (ref 11.5–15.5)
WBC: 16.5 10*3/uL — AB (ref 4.0–10.5)

## 2016-01-01 LAB — TYPE AND SCREEN
ABO/RH(D): AB POS
ANTIBODY SCREEN: NEGATIVE

## 2016-01-01 SURGERY — Surgical Case
Anesthesia: Spinal

## 2016-01-01 MED ORDER — SODIUM CHLORIDE 0.9 % IV SOLN
2.0000 g | Freq: Four times a day (QID) | INTRAVENOUS | Status: DC
  Administered 2016-01-01: 2 g via INTRAVENOUS
  Filled 2016-01-01 (×3): qty 2000

## 2016-01-01 MED ORDER — BUPIVACAINE HCL (PF) 0.25 % IJ SOLN
INTRAMUSCULAR | Status: AC
  Filled 2016-01-01: qty 30

## 2016-01-01 MED ORDER — FENTANYL CITRATE (PF) 100 MCG/2ML IJ SOLN
INTRAMUSCULAR | Status: AC
  Filled 2016-01-01: qty 2

## 2016-01-01 MED ORDER — MORPHINE SULFATE (PF) 0.5 MG/ML IJ SOLN
INTRAMUSCULAR | Status: DC | PRN
  Administered 2016-01-01: .2 mg via INTRATHECAL

## 2016-01-01 MED ORDER — FENTANYL CITRATE (PF) 100 MCG/2ML IJ SOLN
25.0000 ug | INTRAMUSCULAR | Status: DC | PRN
  Administered 2016-01-01 (×2): 50 ug via INTRAVENOUS

## 2016-01-01 MED ORDER — LACTATED RINGERS IV SOLN: INTRAVENOUS | Status: DC

## 2016-01-01 MED ORDER — LACTATED RINGERS IV SOLN
INTRAVENOUS | Status: DC | PRN
  Administered 2016-01-01 (×2): via INTRAVENOUS

## 2016-01-01 MED ORDER — KETAMINE HCL 10 MG/ML IJ SOLN
INTRAMUSCULAR | Status: DC | PRN
  Administered 2016-01-01: 30 mg via INTRAVENOUS
  Administered 2016-01-01: 10 mg via INTRAVENOUS

## 2016-01-01 MED ORDER — SOD CITRATE-CITRIC ACID 500-334 MG/5ML PO SOLN
ORAL | Status: AC
  Administered 2016-01-01: 30 mL
  Filled 2016-01-01: qty 15

## 2016-01-01 MED ORDER — OXYTOCIN 10 UNIT/ML IJ SOLN
INTRAVENOUS | Status: DC | PRN
  Administered 2016-01-01: 40 [IU] via INTRAVENOUS

## 2016-01-01 MED ORDER — MIDAZOLAM HCL 2 MG/2ML IJ SOLN
INTRAMUSCULAR | Status: DC | PRN
  Administered 2016-01-01: 2 mg via INTRAVENOUS

## 2016-01-01 MED ORDER — PHENYLEPHRINE 8 MG IN D5W 100 ML (0.08MG/ML) PREMIX OPTIME
INJECTION | INTRAVENOUS | Status: DC | PRN
  Administered 2016-01-01: 60 ug/min via INTRAVENOUS

## 2016-01-01 MED ORDER — BUPIVACAINE HCL (PF) 0.75 % IJ SOLN
INTRAMUSCULAR | Status: DC | PRN
  Administered 2016-01-01: 12 mg via INTRATHECAL

## 2016-01-01 MED ORDER — FENTANYL CITRATE (PF) 100 MCG/2ML IJ SOLN
INTRAMUSCULAR | Status: DC | PRN
  Administered 2016-01-01: 50 ug via INTRAVENOUS
  Administered 2016-01-01: 15 ug via INTRATHECAL
  Administered 2016-01-01: 50 ug via INTRAVENOUS
  Administered 2016-01-01: 85 ug via INTRAVENOUS

## 2016-01-01 MED ORDER — ONDANSETRON HCL 4 MG/2ML IJ SOLN
INTRAMUSCULAR | Status: AC
  Filled 2016-01-01: qty 2

## 2016-01-01 MED ORDER — LACTATED RINGERS IV SOLN
INTRAVENOUS | Status: DC | PRN
  Administered 2016-01-01: 22:00:00 via INTRAVENOUS

## 2016-01-01 MED ORDER — KETAMINE HCL 10 MG/ML IJ SOLN
INTRAMUSCULAR | Status: AC
  Filled 2016-01-01: qty 1

## 2016-01-01 MED ORDER — MORPHINE SULFATE-NACL 0.5-0.9 MG/ML-% IV SOSY
PREFILLED_SYRINGE | INTRAVENOUS | Status: AC
  Filled 2016-01-01: qty 1

## 2016-01-01 MED ORDER — PHENYLEPHRINE 8 MG IN D5W 100 ML (0.08MG/ML) PREMIX OPTIME
INJECTION | INTRAVENOUS | Status: AC
  Filled 2016-01-01: qty 100

## 2016-01-01 MED ORDER — MIDAZOLAM HCL 2 MG/2ML IJ SOLN
INTRAMUSCULAR | Status: AC
  Filled 2016-01-01: qty 2

## 2016-01-01 MED ORDER — OXYTOCIN 10 UNIT/ML IJ SOLN
INTRAMUSCULAR | Status: AC
  Filled 2016-01-01: qty 4

## 2016-01-01 MED ORDER — ONDANSETRON HCL 4 MG/2ML IJ SOLN
INTRAMUSCULAR | Status: DC | PRN
  Administered 2016-01-01: 4 mg via INTRAVENOUS

## 2016-01-01 MED ORDER — BUPIVACAINE HCL (PF) 0.25 % IJ SOLN
INTRAMUSCULAR | Status: DC | PRN
  Administered 2016-01-01: 30 mL

## 2016-01-01 MED ORDER — GENTAMICIN SULFATE 40 MG/ML IJ SOLN
5.0000 mg/kg | INTRAVENOUS | Status: AC
  Administered 2016-01-01: 336 mg via INTRAVENOUS
  Filled 2016-01-01: qty 8.5

## 2016-01-01 SURGICAL SUPPLY — 26 items
CHLORAPREP W/TINT 26ML (MISCELLANEOUS) ×3 IMPLANT
CLAMP CORD UMBIL (MISCELLANEOUS) IMPLANT
CLOTH BEACON ORANGE TIMEOUT ST (SAFETY) ×3 IMPLANT
DRSG OPSITE POSTOP 4X10 (GAUZE/BANDAGES/DRESSINGS) ×3 IMPLANT
ELECT REM PT RETURN 9FT ADLT (ELECTROSURGICAL) ×3
ELECTRODE REM PT RTRN 9FT ADLT (ELECTROSURGICAL) ×1 IMPLANT
EXTRACTOR VACUUM M CUP 4 TUBE (SUCTIONS) IMPLANT
EXTRACTOR VACUUM M CUP 4' TUBE (SUCTIONS)
GLOVE BIOGEL PI IND STRL 7.0 (GLOVE) ×2 IMPLANT
GLOVE BIOGEL PI INDICATOR 7.0 (GLOVE) ×4
GLOVE ECLIPSE 7.0 STRL STRAW (GLOVE) ×6 IMPLANT
GOWN STRL REUS W/TWL LRG LVL3 (GOWN DISPOSABLE) ×6 IMPLANT
KIT ABG SYR 3ML LUER SLIP (SYRINGE) IMPLANT
NEEDLE HYPO 22GX1.5 SAFETY (NEEDLE) ×3 IMPLANT
NEEDLE HYPO 25X5/8 SAFETYGLIDE (NEEDLE) IMPLANT
NS IRRIG 1000ML POUR BTL (IV SOLUTION) ×3 IMPLANT
PACK C SECTION WH (CUSTOM PROCEDURE TRAY) ×3 IMPLANT
PAD OB MATERNITY 4.3X12.25 (PERSONAL CARE ITEMS) ×3 IMPLANT
PENCIL SMOKE EVAC W/HOLSTER (ELECTROSURGICAL) ×3 IMPLANT
RTRCTR C-SECT PINK 25CM LRG (MISCELLANEOUS) ×3 IMPLANT
SUT VIC AB 0 CTX 36 (SUTURE) ×6
SUT VIC AB 0 CTX36XBRD ANBCTRL (SUTURE) ×3 IMPLANT
SUT VIC AB 4-0 KS 27 (SUTURE) ×3 IMPLANT
SYR 30ML LL (SYRINGE) ×3 IMPLANT
TOWEL OR 17X24 6PK STRL BLUE (TOWEL DISPOSABLE) ×3 IMPLANT
TRAY FOLEY CATH SILVER 14FR (SET/KITS/TRAYS/PACK) ×3 IMPLANT

## 2016-01-01 NOTE — Progress Notes (Signed)
Patient ID: Lindsey Wells, female   DOB: Nov 04, 1980, 34 y.o.   MRN: [id number] Called to see patient due to increasing pain. Pain is in her back and goes to the front. Is irregular, comes and goes and is 8/10. She reports LOF. She is s/p BMZ x 2 at 22 5/7 wks and 22 6/7 wks.  Exam Temp is 99.7 Abdomen is tender to palpation. SSE performed. Patient appears to be 5-6 cm dilated. Frank pus and odor noted. + pool. + fern.  Labs CBC    Component Value Date/Time   WBC 16.5 (H) 01/01/2016 2013   RBC 4.09 01/01/2016 2013   HGB 12.4 01/01/2016 2013   HCT 34.5 (L) 01/01/2016 2013   PLT 251 01/01/2016 2013   MCV 84.4 01/01/2016 2013   MCH 30.3 01/01/2016 2013   MCHC 35.9 01/01/2016 2013   RDW 13.1 01/01/2016 2013   LYMPHSABS 3.7 08/29/2015 0032   MONOABS 0.7 08/29/2015 0032   EOSABS 0.1 08/29/2015 0032   BASOSABS 0.0 08/29/2015 0032   Bedside u/s reveals breech presentation.  Impression - advanced cervical dilation, chorioamnionitis, PPROM.  Plan - move to delivery. After careful consideration of risks/benefits/alternatives, she elects for primary C-section. Patient aware of extreme prematurity, unknown survival of fetus, possible need for classical C-section. NICU and anesthesia aware.

## 2016-01-01 NOTE — Anesthesia Procedure Notes (Signed)
Spinal  Patient location during procedure: OB Start time: 01/01/2016 9:01 PM End time: 01/01/2016 9:06 PM Staffing Anesthesiologist: Ronelle Nigh Performed: anesthesiologist  Preanesthetic Checklist Completed: patient identified, surgical consent, pre-op evaluation, timeout performed, IV checked, risks and benefits discussed and monitors and equipment checked Spinal Block Patient position: sitting Prep: site prepped and draped and DuraPrep Patient monitoring: heart rate, cardiac monitor, continuous pulse ox and blood pressure Approach: midline Location: L3-4 Injection technique: single-shot Needle Needle type: Pencan  Needle gauge: 24 G Needle length: 10 cm Assessment Sensory level: T4

## 2016-01-01 NOTE — Op Note (Signed)
Cesarean Section Operative Report  Kadey Sine Diosdado  12/20/2015 - 01/01/2016  Indications: Breech Presentation and active labor and chorioamnionitis   Pre-operative Diagnosis: breech advanced labor chorioamnionitis.   Post-operative Diagnosis: Same   Surgeon: Surgeon(s) and Role:    * Reva Bores, MD - Primary        Ernestina Penna MD   Assistants: none  Anesthesia: spinal    Estimated Blood Loss: 1000 ml  Specimens: placenta  Findings: Viable boy infant in vertex presentation; Apgars 4 and 7; weight 710 g; arterial cord pH pending; minimal amniotic fluid; intact placenta with three vessel cord; normal uterus, fallopian tubes and ovaries bilaterally.  Baby condition / location:  NICU   Complications: no complications  Indications: Dailynn August Luz is a 35 y.o. G2P1001 with an IUP [redacted]w[redacted]d presenting with dilated cervix.  Procedure Details:  The patient was taken back to the operative suite where spinal anesthesia was placed.  A time out was held and the above information confirmed.   After induction of anesthesia, the patient was draped and prepped in the usual sterile manner and placed in a dorsal supine position with a leftward tilt. A Pfannenstiel incision was made and carried down through the subcutaneous tissue to the fascia. Fascial incision was made and bluntly extended transversely. The fascia was separated from the underlying rectus tissue superiorly and inferiorly. The peritoneum was identified and bluntly entered and extended longitudinally. Alexis retractor was placed. A low transverse uterine incision was made and extended bluntly. Delivered from breech presentation was a viable infant with Apgars and weight as above.  After waiting 30 seconds for delayed cord cutting, the umbilical cord was clamped and cut cord blood was obtained for evaluation. Cord ph was sent. The placenta was removed Intact and appeared to have a mild abruption and  chorioamnionitis. The uterine outline, tubes and ovaries appeared normal. The uterine incision was closed with running locked sutures of 0Vicryl with an imbricating layer of the same.   Hemostasis was observed. The peritoneum was closed with 0 vicryl. The rectus muscles were examined and hemostasis observed. The fascia was then reapproximated with running sutures of 0Vicryl. A total of 30 ml 0.25% Marcaine was injected subcutaneously at the margins of the incision. The subcuticular closure was performed using 0plain gut. The skin was closed with 3-0Vicryl.   Instrument, sponge, and needle counts were correct prior the abdominal closure and were correct at the conclusion of the case.     Disposition: PACU - hemodynamically stable.       SignedLes Pou 01/01/2016 10:48 PM

## 2016-01-01 NOTE — Consult Note (Signed)
Neonatology Note:   Attendance at C-section:    I was asked by Dr. Pratt to attend this primary C/S at 23 6/7 weeks due to onset of preterm labor and chorioamnionitis, and breech presentation. The mother is a G2P1 AB pos, GBS not done with incompetent cervix. She presented 7/24 with cervical dilation and a bulging amniotic sac; at that time, amniosure was negative. She got Betamethasone 7/28-29 and has been on Prometrium. Today, she began to have temp elevation to 100 degrees. Exam showed further cervical dilatation and pus coming out of the os, with positive ferning. She got a dose of Ampicillin and Gentamicin about 1 hour before delivery. Exact time of ROM unknown. At delivery, fluid clear. Infant was delivered frank breech. Delayed cord clamping for 30 seconds. Infant had some movement and HR was about 60 at 1 minute. We bulb suctioned and applied PPV; there was improvement in the HR to about 90, but little respiratory effort, so I intubated him with a 2.5 mm ETT at 3 minutes of life, to a depth of 6.75 cm at the lips. Bilateral breath sounds were heard and the CO2 detector turned yellow with bagging. We secured the ETT, then placed him into a portawarmer bag. We gave 2.1 ml Infasurf, which was tolerated very well, with increased O2 saturation. This occurred at 10 min of life. Weaned the FIO2 to about 40% to keep O2 sats within desired parameters. Ap 4/7. He was seen briefly by his parents in the DR, then was transported to the NICU for further care, with his father in attendance.   Lindsey Bello C. Kahle Mcqueen, MD 

## 2016-01-01 NOTE — Anesthesia Preprocedure Evaluation (Signed)
Anesthesia Evaluation  Patient identified by MRN, date of birth, ID band Patient awake    Reviewed: Allergy & Precautions, H&P , NPO status , Patient's Chart, lab work & pertinent test results  Airway Mallampati: II  TM Distance: >3 FB Neck ROM: full    Dental no notable dental hx. (+) Dental Advisory Given, Teeth Intact   Pulmonary neg pulmonary ROS, Current Smoker,    Pulmonary exam normal breath sounds clear to auscultation       Cardiovascular Exercise Tolerance: Good negative cardio ROS Normal cardiovascular exam Rhythm:regular Rate:Normal     Neuro/Psych negative neurological ROS  negative psych ROS   GI/Hepatic negative GI ROS, Neg liver ROS,   Endo/Other  negative endocrine ROS  Renal/GU negative Renal ROS  negative genitourinary   Musculoskeletal   Abdominal   Peds  Hematology negative hematology ROS (+)   Anesthesia Other Findings   Reproductive/Obstetrics (+) Pregnancy                             Anesthesia Physical Anesthesia Plan  ASA: II  Anesthesia Plan: Spinal   Post-op Pain Management:    Induction:   Airway Management Planned:   Additional Equipment:   Intra-op Plan:   Post-operative Plan:   Informed Consent: I have reviewed the patients History and Physical, chart, labs and discussed the procedure including the risks, benefits and alternatives for the proposed anesthesia with the patient or authorized representative who has indicated his/her understanding and acceptance.   Dental Advisory Given  Plan Discussed with: CRNA  Anesthesia Plan Comments:         Anesthesia Quick Evaluation

## 2016-01-01 NOTE — Transfer of Care (Signed)
Immediate Anesthesia Transfer of Care Note  Patient: Lindsey Wells  Procedure(s) Performed: Procedure(s): CESAREAN SECTION (N/A)  Patient Location: PACU  Anesthesia Type:Spinal  Level of Consciousness: awake, alert  and oriented  Airway & Oxygen Therapy: Patient Spontanous Breathing  Post-op Assessment: Report given to RN and Post -op Vital signs reviewed and stable  Post vital signs: Reviewed and stable  Last Vitals:  Vitals:   01/01/16 1725 01/01/16 1959  BP: 110/65   Pulse: 90   Resp: 18 20  Temp: 37.1 C 37.6 C    Last Pain:  Vitals:   01/01/16 1959  TempSrc: Oral  PainSc:       Patients Stated Pain Goal: 2 (12/20/15 2041)  Complications: No apparent anesthesia complications

## 2016-01-01 NOTE — Progress Notes (Signed)
Patient ID: Lindsey Wells, female   DOB: Jan 22, 1981, 35 y.o.   MRN: [id number] Patient ID: Lindsey Wells, female   DOB: Oct 06, 1980, 35 y.o.   MRN: [id number] FACULTY PRACTICE ANTEPARTUM(COMPREHENSIVE) NOTE  Lindsey Wells is a 35 y.o. G2P1001 at [redacted]w[redacted]d by early ultrasound who is admitted for Cervical insufficiency.   Fetal presentation is breech. Length of Stay:  12  Days  Subjective: No complaints Patient reports the fetal movement as active. Patient reports uterine contraction  activity as none. Patient reports  vaginal bleeding as none. Patient describes fluid per vagina as None.  Vitals:  Blood pressure (!) 109/59, pulse 73, temperature 98.7 F (37.1 C), temperature source Oral, resp. rate 16, height 5\' 5"  (1.651 m), weight 181 lb 9.6 oz (82.4 kg), last menstrual period 07/15/2015, SpO2 100 %. Physical Examination:  General appearance - alert, well appearing, and in no distress Heart - normal rate and regular rhythm Abdomen - soft, nontender, nondistended Fundal Height:  size equals dates Cervical Exam: Not evaluated.  Extremities: extremities normal, atraumatic, no cyanosis or edema and Homans sign is negative, no sign of DVT SCD in place Membranes:intact  Fetal Monitoring:   Fetal Heart Rate A  Mode Doppler filed at 12/31/2015 0804  Baseline Rate (A) 135 bpm filed at 12/31/2015 0804  Variability 6-25 BPM filed at 12/30/2015 2213  Accelerations None filed at 12/30/2015 2213  Decelerations None filed at 12/30/2015 2213      Imaging Studies:     Currently EPIC will not allow sonographic studies to automatically populate into notes.  In the meantime, copy and paste results into note or free text.  Medications:  Scheduled . docusate sodium  100 mg Oral Daily  . prenatal multivitamin  1 tablet Oral Q1200  . progesterone  200 mg Vaginal QHS   I have reviewed the patient's current medications.  ASSESSMENT: [redacted]w[redacted]d  Patient Active Problem List   Diagnosis Date Noted  . Incompetent cervix in pregnancy, antepartum 12/21/2015  . Abnormal dilatation of cervix before onset of labor 12/20/2015  . Pregnancy, location unknown 09/02/2015    PLAN: Continue management with bedrest in hospital and prometrium  EURE,LUTHER H 01/01/2016,7:51 AM

## 2016-01-01 NOTE — Anesthesia Postprocedure Evaluation (Signed)
Anesthesia Post Note  Patient: Lindsey Wells  Procedure(s) Performed: Procedure(s) (LRB): CESAREAN SECTION (N/A)  Patient location during evaluation: PACU Anesthesia Type: Spinal Level of consciousness: oriented and awake and alert Pain management: pain level controlled Vital Signs Assessment: post-procedure vital signs reviewed and stable Respiratory status: spontaneous breathing, respiratory function stable and patient connected to nasal cannula oxygen Cardiovascular status: blood pressure returned to baseline and stable Postop Assessment: no headache and no backache Anesthetic complications: no     Last Vitals:  Vitals:   01/01/16 2245 01/01/16 2300  BP: (!) 105/53 (!) 96/59  Pulse: 60 66  Resp: (!) 22 19  Temp:  36.9 C    Last Pain:  Vitals:   01/01/16 2245  TempSrc:   PainSc: 6    Pain Goal: Patients Stated Pain Goal: 2 (01/01/16 2245)               Toshiyuki Fredell L

## 2016-01-01 NOTE — Progress Notes (Signed)
ANTIBIOTIC CONSULT NOTE - INITIAL  Pharmacy Consult for Gentamicin Indication: Chorioamnionitis/ PPROM/ Surgical prophylaxis  No Known Allergies  Patient Measurements: Height: 5\' 5"  (165.1 cm) Weight: 181 lb 9.6 oz (82.4 kg) IBW/kg (Calculated) : 57 Adjusted Body Weight: 67.2kg  Vital Signs: Temp: (P) 99.7 F (37.6 C) (08/05 1959) Temp Source: (P) Oral (08/05 1959) BP: 110/65 (08/05 1725) Pulse Rate: 90 (08/05 1725)  Labs:  Recent Labs  01/01/16 2013  WBC 16.5*  HGB 12.4  PLT 251   No results for input(s): GENTTROUGH, GENTPEAK, GENTRANDOM in the last 72 hours.   Microbiology: Recent Results (from the past 720 hour(s))  Wet prep, genital     Status: Abnormal   Collection Time: 12/20/15  2:20 PM  Result Value Ref Range Status   Yeast Wet Prep HPF POC NONE SEEN NONE SEEN Final   Trich, Wet Prep NONE SEEN NONE SEEN Final   Clue Cells Wet Prep HPF POC NONE SEEN NONE SEEN Final   WBC, Wet Prep HPF POC MODERATE BACTERIA SEEN (A) NONE SEEN Final    Comment: FEW BACTERIA SEEN   Sperm NONE SEEN  Final    Medications:  Ampicillin 2 gram IV q6h  Assessment: 35 y.o. female G2P1001 at [redacted]w[redacted]d transferring to delivery with leaking of fluid and maternal temp. Pt has requested stat C-section. Natasha Bence started for surgical prophylaxis per MD request and provide coverage for maternal temp/ chorioamnionitis.    Plan:  Gentamicin 340mg  (5mg /kg) mg IV x 1  No further Gentamicin doses are scheduled due to 24 hour coverage of initial dose. Will dose further if > 24 hour antibiotic treatment necessary. Thanks!  Claybon Jabs 01/01/2016,8:55 PM

## 2016-01-02 ENCOUNTER — Encounter (HOSPITAL_COMMUNITY): Payer: Self-pay | Admitting: *Deleted

## 2016-01-02 DIAGNOSIS — O42919 Preterm premature rupture of membranes, unspecified as to length of time between rupture and onset of labor, unspecified trimester: Secondary | ICD-10-CM

## 2016-01-02 DIAGNOSIS — O411290 Chorioamnionitis, unspecified trimester, not applicable or unspecified: Secondary | ICD-10-CM

## 2016-01-02 DIAGNOSIS — N883 Incompetence of cervix uteri: Secondary | ICD-10-CM

## 2016-01-02 LAB — CBC
HEMATOCRIT: 29.9 % — AB (ref 36.0–46.0)
Hemoglobin: 10.6 g/dL — ABNORMAL LOW (ref 12.0–15.0)
MCH: 29.8 pg (ref 26.0–34.0)
MCHC: 35.5 g/dL (ref 30.0–36.0)
MCV: 84 fL (ref 78.0–100.0)
PLATELETS: 211 10*3/uL (ref 150–400)
RBC: 3.56 MIL/uL — ABNORMAL LOW (ref 3.87–5.11)
RDW: 12.7 % (ref 11.5–15.5)
WBC: 20.9 10*3/uL — AB (ref 4.0–10.5)

## 2016-01-02 MED ORDER — IBUPROFEN 600 MG PO TABS
600.0000 mg | ORAL_TABLET | Freq: Four times a day (QID) | ORAL | Status: DC
  Administered 2016-01-02 – 2016-01-04 (×11): 600 mg via ORAL
  Filled 2016-01-02 (×11): qty 1

## 2016-01-02 MED ORDER — SIMETHICONE 80 MG PO CHEW
80.0000 mg | CHEWABLE_TABLET | ORAL | Status: DC
  Administered 2016-01-03 – 2016-01-04 (×2): 80 mg via ORAL
  Filled 2016-01-02 (×3): qty 1

## 2016-01-02 MED ORDER — SIMETHICONE 80 MG PO CHEW
80.0000 mg | CHEWABLE_TABLET | ORAL | Status: DC | PRN
  Administered 2016-01-02: 80 mg via ORAL

## 2016-01-02 MED ORDER — OXYCODONE-ACETAMINOPHEN 5-325 MG PO TABS: 2.0000 | ORAL_TABLET | ORAL | Status: DC | PRN

## 2016-01-02 MED ORDER — OXYCODONE-ACETAMINOPHEN 5-325 MG PO TABS
1.0000 | ORAL_TABLET | ORAL | Status: DC | PRN
  Administered 2016-01-04: 1 via ORAL
  Filled 2016-01-02: qty 1

## 2016-01-02 MED ORDER — CLINDAMYCIN PHOSPHATE 900 MG/50ML IV SOLN
900.0000 mg | Freq: Three times a day (TID) | INTRAVENOUS | Status: DC
  Administered 2016-01-02 – 2016-01-04 (×7): 900 mg via INTRAVENOUS
  Filled 2016-01-02 (×8): qty 50

## 2016-01-02 MED ORDER — GENTAMICIN SULFATE 40 MG/ML IJ SOLN: 1.5000 mg/kg | Freq: Three times a day (TID) | INTRAVENOUS | Status: DC

## 2016-01-02 MED ORDER — LACTATED RINGERS IV SOLN
INTRAVENOUS | Status: DC
  Administered 2016-01-02: 01:00:00 via INTRAVENOUS

## 2016-01-02 MED ORDER — OXYTOCIN 40 UNITS IN LACTATED RINGERS INFUSION - SIMPLE MED: 2.5000 [IU]/h | INTRAVENOUS | Status: DC

## 2016-01-02 MED ORDER — MENTHOL 3 MG MT LOZG: 1.0000 | LOZENGE | OROMUCOSAL | Status: DC | PRN

## 2016-01-02 MED ORDER — DIBUCAINE 1 % RE OINT: 1.0000 "application " | TOPICAL_OINTMENT | RECTAL | Status: DC | PRN

## 2016-01-02 MED ORDER — SIMETHICONE 80 MG PO CHEW
80.0000 mg | CHEWABLE_TABLET | Freq: Three times a day (TID) | ORAL | Status: DC
  Administered 2016-01-02 – 2016-01-04 (×7): 80 mg via ORAL
  Filled 2016-01-02 (×7): qty 1

## 2016-01-02 MED ORDER — WITCH HAZEL-GLYCERIN EX PADS: 1.0000 "application " | MEDICATED_PAD | CUTANEOUS | Status: DC | PRN

## 2016-01-02 MED ORDER — DIPHENHYDRAMINE HCL 25 MG PO CAPS: 25.0000 mg | ORAL_CAPSULE | Freq: Four times a day (QID) | ORAL | Status: DC | PRN

## 2016-01-02 MED ORDER — ACETAMINOPHEN 325 MG PO TABS
650.0000 mg | ORAL_TABLET | ORAL | Status: DC | PRN
  Administered 2016-01-03: 650 mg via ORAL
  Filled 2016-01-02: qty 2

## 2016-01-02 MED ORDER — ZOLPIDEM TARTRATE 5 MG PO TABS: 5.0000 mg | ORAL_TABLET | Freq: Every evening | ORAL | Status: DC | PRN

## 2016-01-02 MED ORDER — SENNOSIDES-DOCUSATE SODIUM 8.6-50 MG PO TABS
2.0000 | ORAL_TABLET | ORAL | Status: DC
  Administered 2016-01-02 – 2016-01-04 (×3): 2 via ORAL
  Filled 2016-01-02 (×4): qty 2

## 2016-01-02 MED ORDER — PRENATAL MULTIVITAMIN CH
1.0000 | ORAL_TABLET | Freq: Every day | ORAL | Status: DC
  Administered 2016-01-02 – 2016-01-04 (×3): 1 via ORAL
  Filled 2016-01-02 (×3): qty 1

## 2016-01-02 MED ORDER — COCONUT OIL OIL: 1.0000 "application " | TOPICAL_OIL | Status: DC | PRN

## 2016-01-02 MED ORDER — TETANUS-DIPHTH-ACELL PERTUSSIS 5-2.5-18.5 LF-MCG/0.5 IM SUSP
0.5000 mL | Freq: Once | INTRAMUSCULAR | Status: AC
  Administered 2016-01-03: 0.5 mL via INTRAMUSCULAR
  Filled 2016-01-02: qty 0.5

## 2016-01-02 NOTE — Progress Notes (Signed)
Interpreter at bedside, Provider explains the risks/benefits of performing a C-section at this time with Pt and SO.  Pt's questions are answered and Pt verbalizes understanding.  Pt prep for OR.

## 2016-01-02 NOTE — Lactation Note (Signed)
This note was copied from a baby's chart. Lactation Consultation Note  Patient Name: Lindsey Wells ZOXWR'UToday's Date: 01/02/2016 Reason for consult: Initial assessment;NICU baby;Infant < 6lbs   Initial consult with Exp BF mom of 23w 6d NICU infant "Leonardo". Spoke with mom with assistance of [facility] Inter[facility] Marta. Dad is also able to interpret. Mom reports she has pumped twice since delivery.  Enc mom to pump every 2-3 hours for 15 minutes on Initiate setting with DEBP followed by hand expression. Showed mom how to hand express and she returned demonstration. No colostrum noted at this time. Dad assists mom is setting up and turning on pump. Gave mom colostrum collection containers and # stickers with instructions for NICU Breast milk storage. Gave mom Spanish Providing Milk for Your Baby in NICU Booklet. Reviewed pumping and storage of breast milk.   Hand expression Handout and BF Resources Handout given. Gave mom Spanish [employer] Brochure, reviewed IP Services, BF Support Groups and LC phone #. Enc mom to call with questions.concerns.  [facility] and was informed of WIC Loaner pump program. Mom asked good questions about pumping, transporting milk and storage of breast milk, all were answered. [facility]          Maternal Data Formula Feeding for Exclusion: No Has patient been taught Hand Expression?: Yes Does the patient have breastfeeding experience prior to this delivery?: Yes  Feeding    LATCH Score/Interventions                      Lactation Tools Discussed/Used WIC Program: Yes Pump Review: Setup, frequency, and cleaning;Milk Storage Initiated by:: Pump by bedside RN, Reviewed BM storage for NICU infant   Consult Status Consult Status: Follow-up Date: 01/03/16 Follow-up type: In-patient    Silas FloodSharon S Hice 01/02/2016, 8:36 AM

## 2016-01-02 NOTE — Progress Notes (Deleted)
Interpreter at bedside, Provider explains risks/benefits of a C-section with Pt and SO.  Pt verbalizes understanding and has no questions at this time.  Consent signed, Pt prep for OR.

## 2016-01-02 NOTE — Anesthesia Postprocedure Evaluation (Signed)
Anesthesia Post Note  Patient: Darlin Dropallely Garcia Diosdado  Procedure(s) Performed: Procedure(s) (LRB): CESAREAN SECTION (N/A)  Patient location during evaluation: Mother Baby Anesthesia Type: Spinal Level of consciousness: awake and alert and oriented Pain management: satisfactory to patient Vital Signs Assessment: post-procedure vital signs reviewed and stable Respiratory status: respiratory function stable and spontaneous breathing Cardiovascular status: blood pressure returned to baseline Postop Assessment: no headache, no backache, spinal receding, patient able to bend at knees and adequate PO intake Anesthetic complications: no     Last Vitals:  Vitals:   01/02/16 0500 01/02/16 1012  BP: (!) 101/50 (!) 99/54  Pulse: 72 76  Resp: 18 18  Temp: 36.8 C 36.7 C    Last Pain:  Vitals:   01/02/16 1012  TempSrc: Oral  PainSc:    Pain Goal: Patients Stated Pain Goal: 2 (01/02/16 [id number])               Karleen DolphinFUSSELL,Emmerich Cryer

## 2016-01-02 NOTE — Addendum Note (Signed)
Addendum  created 01/02/16 1027 by Graciela HusbandsWynn O [place] Merida, CRNA   Sign clinical note

## 2016-01-02 NOTE — Progress Notes (Signed)
POSTPARTUM PROGRESS NOTE  Post Operative Day 1 Subjective:  Lindsey Wells is a 35 y.o. [id number] 9871w6d s/p pLTCS for breech presentation and advanced labor.  No acute events overnight.  Pt denies problems with ambulating, voiding or po intake.  She denies nausea or vomiting.  Pain is well controlled.  She has had flatus. She has not had bowel movement.  Lochia Minimal.   Pt has not had any fever overnight and reports that she is doing much better than before delviery  Objective: Blood pressure (!) 101/50, pulse 72, temperature 98.3 F (36.8 C), temperature source Oral, resp. rate 18, height 5\' 5"  (1.651 m), weight 181 lb 9.6 oz (82.4 kg), last menstrual period 07/15/2015, SpO2 96 %, unknown if currently breastfeeding.  Physical Exam:  General: alert, cooperative and no distress Lochia:normal flow Chest: CTAB Heart: RRR no m/r/g Abdomen: +BS, soft, nontender,  Uterine Fundus: firm, DVT Evaluation: No calf swelling or tenderness Extremities: trace edema, SCDs in place   Recent Labs  01/01/16 2013 01/02/16 0512  HGB 12.4 10.6*  HCT 34.5* 29.9*    Assessment/Plan:  ASSESSMENT: Lindsey Wells is a 35 y.o. [id number] 3071w6d s/p pLTCS for breech presentation and early labor. Concern for Chorioamnionitis at delivery.  #Post partum: Pain control PRN ambulation as much as possible #2 Possible Chorioamnionitis: 24 hours of antibiotics. Pt give 24 hour dose of gentamycin at c-section, clindamycin will be continue for 24 hours.   LOS: 13 days   Ernestina Pennaicholas Tequita Marrs 01/02/2016, 7:04 AM

## 2016-01-03 ENCOUNTER — Encounter (HOSPITAL_COMMUNITY): Payer: Self-pay | Admitting: Family Medicine

## 2016-01-03 LAB — RPR: RPR Ser Ql: NONREACTIVE

## 2016-01-03 NOTE — Lactation Note (Addendum)
This note was copied from a baby's chart. Lactation Consultation Note  Patient Name: Lindsey Wells ZOXWR'UToday's Date: 01/03/2016    [facility] to defer visit w/Mom at this time until further update on infant status received. RN on 3rd floor aware.   Lurline HareRichey, [name] [facility] 01/03/2016, 10:53 AM

## 2016-01-03 NOTE — Plan of Care (Signed)
Problem: Skin Integrity: Goal: Risk for impaired skin integrity will decrease Outcome: Completed/Met Date Met: 01/03/16 Pt is ambulating to NICU. Lindsey Wells    Problem: Coping: Goal: Ability to identify and develop effective coping behavior will improve Outcome: Progressing Pt seems to be coping appropriately with infant in NICU. Lindsey Wells   Goal: Demonstration of participation in decision-making regarding own care will improve Outcome: Progressing Interpreter utilized to communicate with pt. CNM spoke with patient regarding plan for d/c tomorrow. Lindsey Wells    Problem: Role Relationship: Goal: Ability to demonstrate positive interaction with newborn will improve Outcome: Progressing Delayed d/t infant in NICU, but pt is appropriate.

## 2016-01-03 NOTE — Progress Notes (Signed)
Subjective: Postpartum Day #2 Cesarean Delivery Lindsey Wells for interpretaation.  Patient reports incisional pain, tolerating PO, + flatus and no problems voiding.  Ambulatory without orthostatic symptoms. Anxious about baby in NICU. Requests to delay discharge until tomorrow.   Objective: Vital signs in last 24 hours: Temp:  [98.1 F (36.7 C)-98.8 F (37.1 C)] 98.4 F (36.9 C) (08/07 0509) Pulse Rate:  [59-76] 76 (08/07 0509) Resp:  [18] 18 (08/07 0509) BP: (95-104)/(47-57) 102/57 (08/07 0509) SpO2:  [95 %-100 %] 100 % (08/07 0509)  Physical Exam:  General: alert, cooperative and no distress Lochia: appropriate Uterine Fundus: firm at upper umbillicus Incision: no significant drainage, pressure dressing C/D/I DVT Evaluation: No evidence of DVT seen on physical exam.   Recent Labs  01/01/16 2013 01/02/16 0512  HGB 12.4 10.6*  HCT 34.5* 29.9*    Assessment/Plan: Status post Cesarean section. Doing well postoperatively.  Continue current care. Discharge tomorrow.   Malini Flemings 01/03/2016, 8:34 AM

## 2016-01-04 MED ORDER — IBUPROFEN 600 MG PO TABS: 600.0000 mg | ORAL_TABLET | Freq: Four times a day (QID) | ORAL | 1 refills | Status: DC | PRN

## 2016-01-04 MED ORDER — OXYCODONE-ACETAMINOPHEN 5-325 MG PO TABS: 1.0000 | ORAL_TABLET | ORAL | 0 refills | Status: DC | PRN

## 2016-01-04 NOTE — Lactation Note (Signed)
Lactation Consultation Note  Patient Name: Lindsey Wells HEKBT'C Date: 01/04/2016   Mom is 3 days postpartum.  I met w/Mom and provided her w/the Lactation After Loss brochure (Spanish version). We discussed cabbage leaves; IB (as permitted by her provider); & sage tea. Mom shown how to use the hand pump that was in her pumping kit so that she can express if she becomes uncomfortably full. Mom understands that the pump is not to be used to empty her breasts, but to express some milk for her comfort.    On examination, Mom found to have assymmetrical breasts & not much glandular tissue. However, there was some hardness near the nipple-areolar complex (R breast greater than the L breast).   Mom knows how to get in touch w/me if she has further questions once she gets home. Entire consult done w/the assistance of video interpreter, Lindsey Wells.   Matthias Hughs [facility] 01/04/2016, 3:46 PM

## 2016-01-04 NOTE — Discharge Instructions (Signed)
Cuidados en el postparto luego de un parto por cesárea  °(Postpartum Care After Cesarean Delivery) °Después del parto (período de postparto), la estadía normal en el hospital es de 24-72 horas. Si hubo problemas con el trabajo de parto o el parto, o si tiene otros problemas médicos, es posible que deba permanecer en el hospital por más tiempo.  °Mientras esté en el hospital, recibirá ayuda e instrucciones sobre cómo cuidar de usted misma y de su bebé recién nacido durante el postparto.  °Mientras esté en el hospital:  °· Es normal que sienta dolor o molestias en la incisión en el abdomen. Asegúrese de decirle a las enfermeras si siente dolor, así como donde siente el dolor y qué empeora el dolor. °· Si está amamantando, puede sentir contracciones dolorosas en el útero durante algunas semanas. Esto es normal. Las contracciones ayudan a que el útero vuelva a su tamaño normal. °· Es normal tener algo de sangrado después del parto. °¨ Durante los primeros 1-3 días después del parto, el flujo es de color rojo y la cantidad puede ser similar a un período. °¨ Es frecuente que el flujo se inicie y se detenga. °¨ En los primeros días, puede eliminar algunos coágulos pequeños. Informe a las enfermeras si comienza a eliminar coágulos grandes o aumenta el flujo. °¨ No  elimine los coágulos de sangre por el inodoro antes de que la enfermera los vea. °¨ Durante los próximos 3 a 10 días después del parto, el flujo debe ser más acuoso y rosado o marrón. °¨ De diez a catorce días después del parto, el flujo debe ser una pequeña cantidad de secreción de color blanco amarillento. °¨ La cantidad de flujo disminuirá en las primeras semanas después del parto. El flujo puede detenerse en 6-8 semanas. La mayoría de las mujeres no tienen más flujo a las 12 semanas después del parto. °· Usted debe cambiar sus apósitos con frecuencia. °· Lávese bien las manos con agua y jabón durante al menos 20 segundos después de cambiar el apósito, usar el  baño o antes de sostener o alimentar a su recién nacido. °· Se le quitará la vía intravenosa (IV) cuando ya esté bebiendo suficientes líquidos. °· El tubo de drenaje para la orina (catéter urinario) que se inserta antes del parto puede ser retirado luego de 6-8 horas después del parto o cuando las piernas vuelvan a tener sensibilidad. Usted puede sentir que tiene que vaciar la vejiga durante las primeras 6-8 horas después de que le quiten el catéter. °· Si se siente débil, mareada o se desmaya, llame a su enfermera antes de levantarse de la cama por primera vez y antes de tomar una ducha por primera vez. °· En los primeros días después del parto, podrá sentir las mamas sensibles y llenas. Esto se llama congestión. La sensibilidad en los senos por lo general desaparece dentro de las 48-72 horas después de que ocurre la congestión. También puede notar que la leche se escapa de sus senos. Si no está amamantando no estimule sus pechos. La estimulación de las mamas hace que sus senos produzcan más leche. °· Pasar tanto tiempo como sea posible con el bebé recién nacido es muy importante. Durante este tiempo, usted y su bebé deben sentirse cerca y conocerse uno al otro. Tener al bebé en su habitación (alojamiento conjunto) ayudará a fortalecer el vínculo con el bebé recién nacido. Esto le dará tiempo para conocerlo y atenderlo de manera cómoda. °· Las hormonas se modifican después del parto. A   veces, los cambios hormonales pueden causar tristeza o ganas de llorar por un tiempo. Estos sentimientos no deben durar ms de [employer]. Si duran ms que eso, debe hablar con su mdico.  Si lo desea, hable con su mdico acerca de los mtodos de planificacin familiar o mtodos anticonceptivos.  Hable con su mdico acerca de las vacunas. El mdico puede indicarle que se aplique las siguientes vacunas antes de salir del hospital:  [place] contra el ttanos, la difteria y la tos ferina (Tdap) o el ttanos y la difteria (Td).  Es muy importante que usted y su familia (incluyendo a los abuelos) u otras personas que cuidan al recin nacido estn al da con las vacunas Tdap o Td. Las vacunas Tdap o Td pueden ayudar a proteger al recin nacido de enfermedades.  Inmunizacin contra la rubola.  Inmunizacin contra la varicela.  Inmunizacin contra la gripe. Usted debe recibir esta vacunacin anual si no la ha recibido Academic librarian.   Esta informacin no tiene Theme park manager el consejo del mdico. Asegrese de hacerle al mdico cualquier pregunta que tenga.   Document Released: 05/01/2012 Elsevier Interactive Patient Education 2016 [employer]. [employer] del mtodo anticonceptivo (Contraception Choices) La anticoncepcin (control de la natalidad) es el uso de cualquier mtodo o dispositivo para Location manager. A continuacin se indican algunos de esos mtodos. MTODOS HORMONALES   El Implante contraconceptivo consiste en un tubo plstico delgado que contiene la hormona progesterona. No contiene estrgenos. El mdico inserta el tubo en la parte interna del brazo. El tubo puede Geneticist, molecular durante 3 aos. Despus de los 3 aos debe retirarse. El implante impide que los ovarios liberen vulos (ovulacin), espesa el moco cervical, lo que evita que los espermatozoides ingresen al tero y hace ms delgada la membrana que cubre el interior del tero.  Inyecciones de progesterona sola: las Insurance underwriter cada 3 meses para Location manager. La progesterona sinttica impide que los ovarios liberen vulos. Tambin hacen que el moco cervical se espese y modifique el tejido de recubrimiento interno del tero. Esto hace ms difcil que los espermatozoides sobrevivan en el tero.  Las pldoras anticonceptivas contienen estrgenos y Education officer, museum. Su funcin es [employer] ovarios liberen vulos (ovulacin). Las hormonas de los anticonceptivos orales hacen que el moco cervical se haga ms espeso, lo  que evita que el esperma ingrese al tero. Las pldoras anticonceptivas son recetadas por el mdico.Tambin se utilizan para tratar los perodos menstruales abundantes.  Minipldora: este tipo de pldora anticonceptiva contiene slo hormona progesterona. Deben tomarse todos los [address] del mes y debe recetarlas el mdico.  El parche de control de natalidad: contiene hormonas similares a las que contienen las pldoras anticonceptivas. Deben cambiarse una vez por semana y se utilizan bajo prescripcin mdica.  Anillo vaginal: contiene hormonas similares a las que contienen las pldoras anticonceptivas. Se deja colocado durante tres semanas, se lo retira durante 1 semana y luego se coloca uno nuevo. La paciente debe sentirse cmoda al insertar y retirar el anillo de la vagina.Es necesaria la prescripcin mdica.  Anticonceptivos de emergencia: son mtodos para evitar un embarazo despus de Neomia Dear relacin sexual sin proteccin. Esta pldora puede tomarse inmediatamente despus de Child psychotherapist sexuales o hasta 5 [place] de haber tenido sexo sin proteccin. Es ms efectiva si se toma poco tiempo despus de la relacin sexual. Los anticonceptivos de emergencia estn disponibles sin prescripcin mdica. Consltelo con su farmacutico. No use los anticonceptivos de Dispensing optician  nico mtodo anticonceptivo. MTODOS DE Lenis Noon   Condn masculino: es una vaina delgada (ltex o goma) que se coloca cubriendo al pene durante el acto sexual. Deri Fuelling con espermicida para aumentar la efectividad.  Condn femenino. Es una funda delicada y blanda que se adapta holgadamente a la vagina antes de las Clinical research associate.  Diafragma: es una barrera de ltex redonda y suave que debe ser recomendado por un profesional. Se inserta en la vagina, junto con un gel espermicida. Debe insertarse antes de Management consultant. Debe dejar el diafragma colocado en la vagina durante 6 a 8 horas despus de la relacin  sexual.  Capuchn cervical: es una barrera de ltex o taza plstica redonda y [place] que cubre el cuello del tero y debe ser colocada por un mdico. Puede dejarlo colocado en la vagina hasta 48 horas despus de las Clinical research associate.  Esponja: es una pieza blanda y circular de espuma de poliuretano. Contiene un espermicida. Se inserta en la vagina despus de mojarla y antes de las [employer].  Espermicidas: son sustancias qumicas que matan o bloquean al esperma y no lo dejan ingresar al cuello del tero y al tero. Vienen en forma de cremas, geles, supositorios, espuma o comprimidos. No es necesario tener Emergency planning/management officer. Se insertan en la vagina con un aplicador antes de Management consultant. El proceso debe repetirse cada vez que tiene relaciones sexuales. ANTICONCEPTIVOS INTRAUTERINOS  Dispositivo intrauterino (DIU) es un dispositivo en forma de T que se coloca en el tero durante el perodo menstrual, para Location manager. Hay dos tipos:  DIU de cobre: este tipo de DIU est recubierto con un alambre de cobre y se inserta dentro del tero. El cobre hace que el tero y las trompas de Falopio produzcan un liquido que [employer] espermatozoides. Puede permanecer colocado durante 10 aos.  DIU con hormona: este tipo de DIU contiene la hormona progestina (progesterona sinttica). La hormona espesa el moco cervical y evita que los espermatozoides ingresen al tero y tambin afina la membrana que cubre el tero para evitar la implantacin del vulo fertilizado. La hormona debilita o destruye los espermatozoides que ingresan al tero. Puede Geneticist, molecular durante 3-5 aos, segn el tipo de DIU que se [place]. MTODOS ANTICONCEPTIVOS PERMANENTES  Ligadura de trompas en la mujer: se realiza sellando, atando u obstruyendo quirrgicamente las trompas de Falopio lo que impide que el vulo descienda hacia el tero.  Esterilizacin histeroscpica: Implica la colocacin de un pequeo  espiral o la insercin en cada trompa de Falopio. El mdico utiliza una tcnica llamada histeroscopa para Primary school teacher procedimiento. El dispositivo produce la formacin de tejido Designer, television/film set. Esto da como resultado una obstruccin permanente de las trompas de Falopio, de modo que la esperma no pueda fertilizar el vulo. Demora alrededor de 3 meses despus del procedimiento hasta que el conducto se obstruye. Tendr que usar otro mtodo anticonceptivo durante al menos 3 meses.  Esterilizacin masculina: se realiza ligando los conductos por los que pasan los espermatozoides (vasectoma).Esto impide que el esperma ingrese a la vagina durante el acto sexual. Luego del procedimiento, el hombre puede eyacular lquido (semen). MTODOS DE PLANIFICACIN NATURAL  Planificacin familiar natural: consiste en no Management consultant o usar un mtodo de barrera (condn, [place], capuchn cervical) en los [employer] la mujer podra quedar [place].  Mtodo de calendario: consiste en el seguimiento de la duracin de cada ciclo menstrual y la identificacin de los perodos frtiles.  Mtodo de ovulacin: consiste en  evitar las relaciones sexuales durante la ovulacin.  Mtodo sintotrmico: Advertising copywriterconsiste en evitar las relaciones sexuales en la poca en la que se est ovulando, utilizando un termmetro y tendiendo en cuenta los sntomas de la ovulacin.  Mtodo postovulacin: Youth workerconsiste en planificar las relaciones sexuales para despus de haber ovulado. Independientemente del tipo o mtodo anticonceptivo que usted elija, es importante que use condones para protegerse contra las infecciones de transmisin sexual (ETS). Hable con su mdico con respecto a qu mtodo anticonceptivo es el ms apropiado para usted.   Esta informacin no tiene Theme park managercomo fin reemplazar el consejo del mdico. Asegrese de hacerle al mdico cualquier pregunta que tenga.   Document Released: 05/15/2005 Document Revised: 01/15/2013 Elsevier  Interactive Patient Education [employer]2016 Elsevier Inc.

## 2016-01-04 NOTE — Progress Notes (Signed)
I offered follow-up support to pt and her SO.  They requested help with the funeral home process.  The family selected [facility] and plan to have their baby cremated. I called [facility] on their behalf, at the request of the family, due to language barrier. I offered grief support and compassionate listening as well and gave them my card for follow-up support.  Chaplain Dyanne CarrelKaty Zyan Mirkin, Bcc Pager, [phone] 10:56 AM    01/04/16 1000  Clinical Encounter Type  Visited With Patient and family together  Visit Type Spiritual support  Referral From Nurse;Chaplain  Spiritual Encounters  Spiritual Needs Grief support  Stress Factors  Patient Stress Factors Loss (Perinatal loss)

## 2016-01-04 NOTE — Discharge Summary (Signed)
OB Discharge Summary     Patient Name: Lindsey Wells DOB: 1980-12-18 MRN: [id number]  Date of admission: 12/20/2015 Delivering MD: Reva BoresPRATT, TANYA S   Date of discharge: 01/04/2016  Admitting diagnosis: 22WKS, BULGING BAG Intrauterine pregnancy: 4970w6d     Secondary diagnosis:  Active Problems:   Abnormal dilatation of cervix before onset of labor   Incompetent cervix in pregnancy, antepartum  Additional problems:Active labor with chorioamnionitis and breech presentation     Discharge diagnosis: Preterm Pregnancy Delivered                                          Primary low transverse cesarean section                                                                                            Post partum procedures:IV antibiotics x 48 hours  Augmentation: n/a  Complications: None  Hospital course:  Onset of Labor With Unplanned C/S  35 y.o. yo G2P1102 at 2770w6d was admitted in Active Labor on 12/20/2015. Patient had a labor course significant for presenting with advanced cervical dilation in active labor.Membrane Rupture Time/Date: 6:30 PM ,01/01/2016   The patient went for cesarean section due to breech and chorioamnionitis, and delivered a Viable infant,01/01/2016 with neonatal demise on 01/04/2016 in NICU.  Details of operation can be found in separate operative note. Patient had an uncomplicated postpartum course.  She is ambulating,tolerating a regular diet, passing flatus, and urinating well.  Patient is discharged home in stable condition and appropriately grieving her loss.  01/04/16.   Physical exam Vitals:   01/03/16 1130 01/03/16 1828 01/04/16 0129 01/04/16 0615  BP: 103/61 (!) 109/59 (!) 121/59 (!) 97/51  Pulse: 74 84 81 (!) 58  Resp: 18 18 20 18   Temp: 98 F (36.7 C) 98.4 F (36.9 C) 99.4 F (37.4 C) 98 F (36.7 C)  TempSrc: Oral Oral Oral Oral  SpO2: 98% 98% 100% 97%  Weight:      Height:       General: alert and cooperative Breasts: filling Lochia:  appropriate Uterine Fundus: firm Incision: Pressure dressing removed. Honeycomb dressing C/D/I DVT Evaluation: No evidence of DVT seen on physical exam. Labs: Lab Results  Component Value Date   WBC 20.9 (H) 01/02/2016   HGB 10.6 (L) 01/02/2016   HCT 29.9 (L) 01/02/2016   MCV 84.0 01/02/2016   PLT 211 01/02/2016   CMP Latest Ref Rng & Units 08/29/2015  Glucose 65 - 99 mg/dL 91  BUN 6 - 20 mg/dL 12  Creatinine 4.090.44 - 8.111.00 mg/dL 9.140.66  Sodium 782135 - 956145 mmol/L 138  Potassium 3.5 - 5.1 mmol/L 3.7  Chloride 101 - 111 mmol/L 109  CO2 22 - 32 mmol/L 23  Calcium 8.9 - 10.3 mg/dL 2.1(H8.7(L)    Discharge instruction: per After Visit Summary and "Baby and Me Booklet".  After visit meds:    Medication List    TAKE these medications   ibuprofen 600 MG tablet Commonly known as:  ADVIL,MOTRIN  Take 1 tablet (600 mg total) by mouth every 6 (six) hours as needed.   oxyCODONE-acetaminophen 5-325 MG tablet Commonly known as:  PERCOCET/ROXICET Take 1 tablet by mouth every 4 (four) hours as needed.       Diet: routine diet  Activity: Advance as tolerated. Pelvic rest for 6 weeks.   Outpatient follow up:4 weeks Follow up Appt:No future appointments. Call [facility] for appointment and again discuss contraception Follow up Visit:[facility] Postpartum contraception: Undecided  Newborn Data: Live born female  Birth Weight: 1 lb 9 oz (710 g) APGAR: 4, 7 Neonatal demise at 3 days  Baby Feeding: n/a Disposition:morgue   01/04/2016 POE,DEIRDRE, CNM

## 2016-01-04 NOTE — Progress Notes (Signed)
Pt. Is discharged in the care of husband,with N.T. Escort. Denies pain or discomfortEmotional support was given due to lost.  Pt able to verbalize fear about lost. Understands all discharged instructions well. IIntrepretor present for all instructions.

## 2016-01-04 NOTE — Progress Notes (Signed)
Pt was holding her baby when I arrived; her husband was bedside. Both were appropriately tearful.  They requested prayer and baptism prior to removing support measures. They were accepting, but tearful of potential of pt outcome/demise. Following baptism, [facility] allowed them to spend time as needed w/baby. Visit took place in NICU bedside of infant. Please page if additional support is needed. Chaplain Marjory LiesPamela Carrington Holder, M.Div.   01/04/16 0300  Clinical Encounter Type  Visited With Patient and family together

## 2016-08-03 LAB — OB RESULTS CONSOLE HGB/HCT, BLOOD
HEMATOCRIT: 35 %
HEMOGLOBIN: 11.6 g/dL

## 2016-08-03 LAB — OB RESULTS CONSOLE ABO/RH: RH Type: POSITIVE

## 2016-08-03 LAB — OB RESULTS CONSOLE GC/CHLAMYDIA
Chlamydia: NEGATIVE
Gonorrhea: NEGATIVE

## 2016-08-03 LAB — OB RESULTS CONSOLE PLATELET COUNT: Platelets: 204 10*3/uL

## 2016-08-03 LAB — OB RESULTS CONSOLE RPR: RPR: NONREACTIVE

## 2016-08-03 LAB — OB RESULTS CONSOLE ANTIBODY SCREEN: ANTIBODY SCREEN: NEGATIVE

## 2016-08-03 LAB — GLUCOSE TOLERANCE, 1 HOUR: Glucose, 1 Hour GTT: 107

## 2016-08-03 LAB — OB RESULTS CONSOLE RUBELLA ANTIBODY, IGM: RUBELLA: IMMUNE

## 2016-08-03 LAB — OB RESULTS CONSOLE VARICELLA ZOSTER ANTIBODY, IGG: Varicella: IMMUNE

## 2016-08-18 ENCOUNTER — Encounter: Payer: Self-pay | Admitting: *Deleted

## 2016-08-23 ENCOUNTER — Encounter: Payer: Self-pay | Admitting: Family Medicine

## 2016-08-23 ENCOUNTER — Ambulatory Visit (INDEPENDENT_AMBULATORY_CARE_PROVIDER_SITE_OTHER): Payer: Medicaid Other | Admitting: Family Medicine

## 2016-08-23 ENCOUNTER — Ambulatory Visit (HOSPITAL_COMMUNITY)
Admission: RE | Admit: 2016-08-23 | Discharge: 2016-08-23 | Disposition: A | Discharge status: Home / Self Care | Payer: Self-pay | Source: Ambulatory Visit | Attending: Family Medicine | Admitting: Family Medicine

## 2016-08-23 DIAGNOSIS — Z98891 History of uterine scar from previous surgery: Secondary | ICD-10-CM | POA: Insufficient documentation

## 2016-08-23 DIAGNOSIS — O09299 Supervision of pregnancy with other poor reproductive or obstetric history, unspecified trimester: Secondary | ICD-10-CM | POA: Insufficient documentation

## 2016-08-23 DIAGNOSIS — O34219 Maternal care for unspecified type scar from previous cesarean delivery: Secondary | ICD-10-CM

## 2016-08-23 DIAGNOSIS — O09292 Supervision of pregnancy with other poor reproductive or obstetric history, second trimester: Secondary | ICD-10-CM | POA: Diagnosis present

## 2016-08-23 DIAGNOSIS — O0990 Supervision of high risk pregnancy, unspecified, unspecified trimester: Secondary | ICD-10-CM | POA: Insufficient documentation

## 2016-08-23 DIAGNOSIS — O34211 Maternal care for low transverse scar from previous cesarean delivery: Secondary | ICD-10-CM | POA: Insufficient documentation

## 2016-08-23 DIAGNOSIS — O0992 Supervision of high risk pregnancy, unspecified, second trimester: Secondary | ICD-10-CM

## 2016-08-23 DIAGNOSIS — O09212 Supervision of pregnancy with history of pre-term labor, second trimester: Secondary | ICD-10-CM | POA: Insufficient documentation

## 2016-08-23 DIAGNOSIS — Z3A22 22 weeks gestation of pregnancy: Secondary | ICD-10-CM | POA: Insufficient documentation

## 2016-08-23 DIAGNOSIS — O3432 Maternal care for cervical incompetence, second trimester: Secondary | ICD-10-CM | POA: Insufficient documentation

## 2016-08-23 LAB — POCT URINALYSIS DIP (DEVICE)
Bilirubin Urine: NEGATIVE
Glucose, UA: NEGATIVE mg/dL
Hgb urine dipstick: NEGATIVE
Ketones, ur: NEGATIVE mg/dL
Leukocytes, UA: NEGATIVE
NITRITE: NEGATIVE
PROTEIN: NEGATIVE mg/dL
Specific Gravity, Urine: 1.025 (ref 1.005–1.030)
UROBILINOGEN UA: 0.2 mg/dL (ref 0.0–1.0)
pH: 6.5 (ref 5.0–8.0)

## 2016-08-23 NOTE — Progress Notes (Signed)
   PRENATAL VISIT NOTE  Subjective:  Lindsey Wells is a 36 y.o. G3P1101 at 5324w0d being seen today for transferring prenatal care. [facility].  She is currently monitored for the following issues for this high-risk pregnancy and has Supervision of high-risk pregnancy; History of incompetent cervix, currently pregnant; History of premature rupture of membranes in previous pregnancy, currently pregnant; and Previous cesarean delivery affecting pregnancy, antepartum on her problem list.  Patient reports no complaints.  Contractions: Not present. Vag. Bleeding: None.  Movement: Present. Denies leaking of fluid.   The following portions of the patient's history were reviewed and updated as appropriate: allergies, current medications, past family history, past medical history, past social history, past surgical history and problem list. Problem list updated.  Objective:   Vitals:   08/23/16 0754 08/23/16 0815  BP:  111/63  Pulse:  78  Weight:  196 lb 11.2 oz (89.2 kg)  Height: 5\' 4"  (1.626 m)     Fetal Status: Fetal Heart Rate (bpm): 136 Fundal Height: 23 cm Movement: Present     General:  Alert, oriented and cooperative. Patient is in no acute distress.  Skin: Skin is warm and dry. No rash noted.   Cardiovascular: Normal heart rate noted  Respiratory: Normal respiratory effort, no problems with respiration noted  Abdomen: Soft, gravid, appropriate for gestational age. Pain/Pressure: Absent     Pelvic:  Cervical exam deferred        Extremities: Normal range of motion.  Edema: None  Mental Status: Normal mood and affect. Normal behavior. Normal judgment and thought content.   Assessment and Plan:  Pregnancy: G3P1101 at 624w0d  1. Supervision of high risk pregnancy in second trimester New OB records and labs reviewed. Box updated. Anatomy u/s 2 wks ago with cervical length of 3.1 cm.  2. History of incompetent cervix, currently pregnant Had 23 6/7 wk delivery  with G2, in hospital from 22 wks on. Got chorio and delivered by C-section due to breech presentation. Baby dies at 3-4 days of life. - US MFM OB Transvaginal; Future  3. History of premature rupture of membranes in previous pregnancy, currently pregnant in second trimester Too late for 17 P--serial cervical lengths and Prometrium if indicated. No symptoms presently - US MFM OB Transvaginal; Future  4. Previous cesarean delivery affecting pregnancy, antepartum LTCS--op note reviewed.   Preterm labor symptoms and general obstetric precautions including but not limited to vaginal bleeding, contractions, leaking of fluid and fetal movement were reviewed in detail with the patient. Please refer to After Visit Summary for other counseling recommendations.  Return in 2 weeks (on 09/06/2016).   Lindsey Boresanya S Bralin Garry, MD

## 2016-08-23 NOTE — Patient Instructions (Addendum)
Declaracin de voluntad anticipada Market researcher) La declaracin de voluntad anticipada es el documento legal que le permite tomar decisiones acerca del tratamiento mdico y el cuidado de la salud en caso de que no pueda expresarse por s mismo. Es la forma de Cytogeneticist sus deseos a familiares, amigos y mdicos. Las personas especificadas pueden expresar sus decisiones acerca del cuidado en las etapas terminales de la vida, con el fin de evitar confusiones en caso de que usted no pueda comunicarse. Idealmente, el proceso de Physiological scientist y Network engineer de voluntad anticipada debe realizarse a lo largo del Air cabin crew, Engineer, technical sales de tomarse todas las decisiones a Radiographer, therapeutic. La declaracin de voluntad anticipada puede modificarse en cualquier momento si cambia su situacin, y usted puede [place] de idea en cualquier momento, incluso despus de haberla firmado. Cada estado tiene sus propias leyes con respecto a la declaracin de voluntad anticipada. Es posible que desee consultar a su mdico, su abogado o al representante estatal acerca de las leyes de su [place]. A continuacin se incluyen algunos ejemplos de declaracin de voluntad anticipada. APODERADO PARA LA ASISTENCIA SANITARIA Y PODER NOTARIAL DURADERO PARA ASUNTOS MDICOS El apoderado para la asistencia sanitaria es una persona (agente) designada para tomar decisiones mdicas en caso de que usted no pueda hacerlo. Generalmente se elige a Teacher, English as a foreign language conocidas y de confianza para representar sus preferencias cuando no estn en condiciones de hacerlo. Usted debe asegurarse de pedirle a esa persona realizar un acuerdo para actuar como su agente. El agente tendr que evaluar por s mismo en el caso de una decisin mdica para la cual no se conocen los deseos del [place]. Un poder notarial duradero para asuntos mdicos es un documento legal que nombra a su representante para temas de salud. En funcin de las leyes de su [place], una vez escrito el documento,  es posible que adems deba:  [place].  Autenticado ante escribano.  Fechado.  Copiado.  Atestiguado.  Incorporado a los registros mdicos del paciente. Es posible que tambin desee nombrar a alguien para Air cabin crew sus asuntos financieros, si no puede hacerlo usted mismo. Esto se denomina poder notarial duradero para asuntos financieros. Este es un documento legal diferente del poder notarial duradero para asuntos de salud. Usted puede elegir a la misma persona o a Pharmacist, hospital de la designada como representante de sus asuntos de [place], para que acte como agente en los asuntos financieros. EL TESTAMENTO EN VIDA El testamento en vida es un conjunto de instrucciones que documentan sus deseos acerca de la atencin mdica para el momento en que no pueda valerse por s mismo. Se utiliza en caso de que a usted le ocurra lo siguiente:  Tenga una enfermedad terminal.  Est incapacitado.  No pueda comunicarse.  No sea capaz de tomar decisiones. Los puntos a Quarry manager en vida incluyen lo siguiente:  El uso o no de equipos para Theatre manager la vida, como dispositivos de dilisis y ventiladores (respiradores).  La orden de "no reanimar" (ONR), que es la instruccin de no hacer reanimacin cardiopulmonar si la respiracin o los latidos cardacos se detienen.  Alimentacin por sonda.  Suspensin de alimentos y lquidos.  Atencin de alivio (cuidados paliativos) cuando el objetivo es la comodidad ms que la [place].  Donacin de rganos y tejidos. El testamento en vida no da instrucciones acerca de la distribucin del dinero y las propiedades en caso de fallecimiento. Es aconsejable buscar asesoramiento de un abogado especializado en redactar el testamento con respecto a  las posesiones. Las [employer] [facility], beneficiarios y distribucin de los activos sern legalmente vinculantes. Este proceso puede Paramedic a sus familiares y Personnel officer de la carga de los conflictos o las  cuestiones relacionadas con la distribucin de sus bienes. ORDEN DE NO REANIMAR (ONR) La orden de no reanimar (ONR) es el pedido de no Event organiser (RCP) en el caso de que el corazn o la respiracin se detengan. Excepto que se den [employer]X Corporation, el mdico tratar de ayudar a todo paciente cuyo corazn se ha detenido o que ha dejado de Industrial/product designer. Esta informacin no tiene Theme park manager el consejo del mdico. Asegrese de hacerle al mdico cualquier pregunta que tenga. Document Released: 01/15/2013 Document Revised: 09/06/2015 Document Reviewed: 10/02/2012 Elsevier Interactive Patient Education  2017 [employer].    [place] trimestre de Psychiatrist (Second Trimester of Pregnancy) El segundo trimestre va desde la semana13 hasta la 28, desde el cuarto hasta el sexto mes, y suele ser el momento en el que mejor se siente. Su organismo se ha adaptado a Charity fundraiser y comienza a Diplomatic Services operational officer. En general, las nuseas matutinas han disminuido o han desaparecido completamente, puede tener ms energa y un aumento de apetito. El segundo trimestre es tambin la poca en la que el feto se desarrolla rpidamente. Hacia el final del sexto mes, el feto mide aproximadamente 9pulgadas (23cm) y pesa alrededor de 1 libras (700g). Es probable que sienta que el beb se Teacher, English as a foreign language (da pataditas) entre las 18 y 20semanas del Psychiatrist. CAMBIOS EN EL ORGANISMO Su organismo atraviesa por muchos cambios durante el [place], y estos varan de Neomia Dear mujer a Educational psychologist.  Seguir American Standard Companies. Notar que la parte baja del abdomen sobresale.  Podrn aparecer las primeras Albertson's caderas, el abdomen y las [place].  Es posible que tenga dolores de cabeza que pueden aliviarse con los medicamentos que el mdico le permita tomar.  Tal vez tenga necesidad de orinar con ms frecuencia porque el feto est ejerciendo presin Ambulance person.  Debido al Vanetta Mulders podr sentir Anthoney Harada  estomacal con frecuencia.  Puede estar estreida, ya que ciertas hormonas enlentecen los movimientos de los msculos que [employer] desechos a travs de los intestinos.  Pueden aparecer hemorroides o abultarse e hincharse las venas (venas varicosas).  Puede tener dolor de espalda que se debe al Citigroup de peso y a que las hormonas del Management consultant las articulaciones entre los huesos de la pelvis, y Public librarian consecuencia de la modificacin del peso y los msculos que mantienen el equilibrio.  Las ConAgra Foods seguirn creciendo y Development worker, community.  Las Veterinary surgeon y estar sensibles al cepillado y al hilo dental.  Pueden aparecer zonas oscuras o manchas (cloasma, mscara del Psychiatrist) en el rostro que probablemente se atenuar despus del nacimiento del beb.  Es posible que se forme una lnea oscura desde el ombligo hasta la zona del pubis (linea nigra) que probablemente se atenuar despus del nacimiento del beb.  Tal vez haya cambios en el cabello que pueden incluir su engrosamiento, crecimiento rpido y cambios en la textura. Adems, a algunas mujeres se les cae el cabello durante o despus del embarazo, o tienen el cabello seco o fino. Lo ms probable es que el cabello se le normalice despus del nacimiento del beb. QU DEBE ESPERAR EN LAS CONSULTAS PRENATALES Durante una visita prenatal de rutina:  La pesarn para asegurarse de que usted y el feto estn creciendo normalmente.  Le tomarn la presin arterial.  Le medirn el abdomen para controlar el desarrollo del beb.  Se escucharn los latidos cardacos fetales.  Se evaluarn los resultados de los estudios solicitados en visitas anteriores. El mdico puede preguntarle lo siguiente:  Cmo se siente.  Si siente los movimientos del beb.  Si ha tenido sntomas anormales, como prdida de lquido, [place], dolores de cabeza intensos o clicos abdominales.  Si est consumiendo algn producto que contenga tabaco, como  cigarrillos, tabaco de Theatre manager y Administrator, Civil Service.  Si tiene [employer]. Otros estudios que podrn realizarse durante el segundo trimestre incluyen lo siguiente:  Anlisis de sangre para detectar lo siguiente:  Concentraciones de hierro bajas (anemia).  Diabetes gestacional (entre la semana 24 y la 28).  Anticuerpos Rh.  Anlisis de orina para detectar infecciones, diabetes o protenas en la orina.  Una ecografa para confirmar que el beb crece y se desarrolla correctamente.  Una amniocentesis para diagnosticar posibles problemas genticos.  Estudios del feto para descartar espina bfida y sndrome de [place].  Prueba del VIH (virus de inmunodeficiencia humana). Los exmenes prenatales de rutina incluyen la prueba de deteccin del VIH, a menos que decida no Futures trader. INSTRUCCIONES PARA EL CUIDADO EN EL HOGAR  Evite fumar, consumir hierbas, beber alcohol y tomar frmacos que no le hayan recetado. Estas sustancias qumicas afectan la formacin y el desarrollo del beb.  No consuma ningn producto que contenga tabaco, lo que incluye cigarrillos, tabaco de Theatre manager y Administrator, Civil Service. Si necesita ayuda para dejar de fumar, consulte al American Express. Puede recibir asesoramiento y otro tipo de recursos para dejar de fumar.  Siga las indicaciones del mdico en relacin con el uso de medicamentos. Durante el embarazo, hay medicamentos que son seguros de tomar y otros que no.  Haga ejercicio solamente como se lo haya indicado el mdico. Sentir clicos uterinos es un buen signo para Restaurant manager, fast food actividad fsica.  Contine comiendo alimentos sanos con regularidad.  Use un sostn que le brinde buen soporte si le Altria Group.  No se d baos de inmersin en agua caliente, baos turcos ni saunas.  Use el cinturn de seguridad en todo momento mientras conduce.  No coma carne cruda ni queso sin cocinar; evite el contacto con las bandejas sanitarias de los gatos y la tierra  que estos animales usan. Estos elementos contienen grmenes que pueden causar defectos congnitos en el beb.  Tome las vitaminas prenatales.  Tome entre 1500 y 2000mg  de calcio diariamente comenzando en la semana20 del embarazo [place].  Si est estreida, pruebe un laxante suave (si el mdico lo autoriza). Consuma ms alimentos ricos en fibra, como vegetales y frutas frescos y Radiation protection practitioner. Beba gran cantidad de lquido para mantener la orina de tono claro o color amarillo plido.  Dese baos de asiento con agua tibia para Engineer, materials o las molestias causadas por las hemorroides. Use una crema para las hemorroides si el mdico la autoriza.  Si tiene venas varicosas, use medias de descanso. Eleve los pies durante , 3 o 4veces por da. Limite el consumo de sal en su dieta.  No levante objetos pesados, use zapatos de tacones bajos y [address].  Descanse con las piernas elevadas si tiene calambres o dolor de cintura.  Visite a su dentista si an no lo ha Occupational hygienist. Use un cepillo de dientes blando para higienizarse los dientes y psese el hilo dental con suavidad.  Puede seguir Cal[employer] Corporation, a menos que el [employer]  indique lo contrario.  Concurra a todas las visitas prenatales segn las indicaciones de su mdico. SOLICITE ATENCIN MDICA SI:  [place].  Siente clicos leves, presin en la pelvis o dolor persistente en el abdomen.  Tiene nuseas, vmitos o diarrea persistentes.  Brett Fairy secrecin vaginal con mal olor.  Siente dolor al ConocoPhillips. SOLICITE ATENCIN MDICA DE INMEDIATO SI:  Tiene fiebre.  Tiene una prdida de lquido por la vagina.  Tiene sangrado o pequeas prdidas vaginales.  Siente dolor intenso o clicos en el abdomen.  Sube o baja de peso rpidamente.  Tiene dificultad para respirar y siente dolor de pecho.  Sbitamente se le hinchan mucho el rostro, las [place], los  tobillos, los pies o las piernas.  No ha sentido los movimientos del beb durante Georgianne Fick.  Siente un dolor de cabeza intenso que no se alivia con medicamentos.  Su visin se modifica. Esta informacin no tiene Theme park manager el consejo del mdico. Asegrese de hacerle al mdico cualquier pregunta que tenga. Document Released: 02/22/2005 Document Revised: 06/05/2014 Document Reviewed: 07/16/2012 Elsevier Interactive Patient Education  2017 Elsevier Inc.   [place] materna (Breastfeeding) Decidir Museum/gallery exhibitions officer es una de las mejores elecciones que puede hacer por usted y su beb. El cambio hormonal durante el Psychiatrist produce el desarrollo del tejido mamario y [place] la cantidad y el tamao de los conductos galactforos. Estas hormonas tambin permiten que las protenas, los azcares y las grasas de la sangre produzcan la [employer] materna en las glndulas productoras de [place]. Las hormonas impiden que la leche materna sea liberada antes del nacimiento del beb, adems de impulsar el flujo de leche luego del nacimiento. Una vez que ha comenzado a Museum/gallery exhibitions officer, Conservation officer, nature beb, as Immunologist succin o Theatre manager, pueden estimular la liberacin de [place] de las glndulas productoras de [place]. LOS BENEFICIOS DE AMAMANTAR Para el beb  La primera leche (calostro) ayuda a Careers information officer funcionamiento del sistema digestivo del beb.  La leche tiene anticuerpos que ayudan a Radio producer las infecciones en el beb.  El beb tiene una menor incidencia de asma, alergias y del sndrome de muerte sbita del lactante.  Los nutrientes en la [place] materna son mejores para el beb que la [place] maternizada y estn preparados exclusivamente para cubrir las necesidades del beb.  La leche materna mejora el desarrollo cerebral del beb.  Es menos probable que el beb desarrolle otras enfermedades, como obesidad infantil, asma o diabetes mellitus de tipo 2. Para usted  La lactancia materna favorece el desarrollo de un  vnculo muy especial entre la madre y el beb.  Es conveniente. La leche materna siempre est disponible a la Human resources officer y es [place].  La lactancia materna ayuda a quemar caloras y a perder el peso ganado durante el [place].  Favorece la contraccin del tero al tamao que tena antes del embarazo de manera ms rpida y disminuye el sangrado (loquios) despus del parto.  La lactancia materna contribuye a reducir Nurse, adult de desarrollar diabetes mellitus de tipo 2, osteoporosis o cncer de mama o de ovario en el futuro. SIGNOS DE QUE EL BEB EST HAMBRIENTO Primeros signos de [address] de [place].  Se estira.  Mueve la cabeza de un lado a otro.  Mueve la cabeza y abre la boca cuando se le toca la mejilla o la comisura de la boca (reflejo de bsqueda).  Aumenta las vocalizaciones, tales como sonidos de succin, se relame los labios, emite arrullos,  suspiros, o chirridos.  Mueve la [employer] boca.  Se chupa con ganas los dedos o las manos. Signos tardos de Fisher Scientific.  Llora de manera intermitente. Signos de AES Corporation signos de hambre extrema requerirn que lo calme y lo consuele antes de que el beb pueda alimentarse adecuadamente. No espere a que se manifiesten los siguientes signos de hambre extrema para comenzar a Museum/gallery exhibitions officer:  Designer, jewellery.  Llanto intenso y fuerte.  Gritos. INFORMACIN BSICA SOBRE LA LACTANCIA MATERNA Iniciacin de la lactancia materna  Encuentre un lugar cmodo para sentarse o acostarse, con un buen respaldo para el cuello y la espalda.  Coloque una almohada o una manta enrollada debajo del beb para acomodarlo a la altura de la mama (si est sentada). Las almohadas para Museum/gallery exhibitions officer se han diseado especialmente a fin de servir de apoyo para los brazos y el beb [employer].  Asegrese de que el abdomen del beb est frente al suyo.  Masajee suavemente la mama. Con las yemas de los  dedos, masajee la pared del pecho hacia el pezn en un movimiento circular. Esto estimula el flujo de [place]. Es posible que Engineer, manufacturing systems este movimiento mientras amamanta si la leche fluye lentamente.  Sostenga la mama con el pulgar por arriba del pezn y los otros 4 dedos por debajo de la mama. Asegrese de que los dedos se encuentren lejos del pezn y de la boca del beb.  Empuje suavemente los labios del beb con el pezn o con el dedo.  Cuando la boca del beb se abra lo suficiente, acrquelo rpidamente a la mama e introduzca todo el pezn y la zona oscura que lo rodea (areola), tanto como sea posible, dentro de la boca del beb.  Debe haber ms areola visible por arriba del labio superior del beb que por debajo del labio inferior.  La lengua del beb debe estar entre la enca inferior y la [place].  Asegrese de que la boca del beb est en la posicin correcta alrededor del pezn (prendida). Los labios del beb deben crear un sello sobre la mama y estar doblados hacia afuera (invertidos).  Es comn que el beb succione durante 2 a 3 minutos para que comience el flujo de [place]. Cmo debe prenderse Es muy importante que le ensee al beb cmo prenderse adecuadamente a la mama. Si el beb no se prende adecuadamente, puede causarle dolor en el pezn y reducir la produccin de [place], y hacer que el beb tenga un escaso aumento de [place]. Adems, si el beb no se prende adecuadamente al pezn, puede tragar aire durante la alimentacin. Esto puede causarle molestias al beb. Hacer eructar al beb al Pilar Plate de mama puede ayudarlo a liberar el aire. Sin embargo, ensearle al beb cmo prenderse a la mama adecuadamente es la mejor manera de evitar que se sienta molesto por tragar Oceanographer se alimenta. Signos de que el beb se ha prendido adecuadamente al pezn:  Payton Doughty o succiona de modo silencioso, sin causarle dolor.  Se escucha que traga cada 3 o 4 succiones.  Hay  movimientos musculares por arriba y por delante de sus odos al Printmaker. Signos de que el beb no se ha prendido Audiological scientist al pezn:  Hace ruidos de succin o de chasquido mientras se alimenta.  Siente dolor en el pezn. Si cree que el beb no se prendi correctamente, deslice el dedo en la comisura de la boca y [employer] Corporation las encas del beb para interrumpir  la succin. Intente comenzar a amamantar nuevamente. Signos de Fish farm manager Signos del beb:  Disminuye gradualmente el nmero de succiones o cesa la succin por completo.  Se duerme.  Relaja el cuerpo.  Retiene una pequea cantidad de [facility] boca.  Se desprende solo del pecho. Signos que presenta usted:  Las mamas han aumentado la firmeza, el peso y el tamao 1 a 3 horas despus de Museum/gallery exhibitions officer.  Estn ms blandas inmediatamente despus de amamantar.  Un aumento del volumen de [place], y tambin un cambio en su consistencia y color se producen hacia el quinto da de Tour manager.  Los pezones no duelen, ni estn agrietados ni sangran. Signos de que su beb recibe la cantidad de leche suficiente  Mojar por lo menos 1 o 2 paales durante las primeras 24 horas despus del nacimiento.  Mojar por lo menos 5 o 6 paales cada 24 horas durante la primera semana despus del nacimiento. La orina debe ser transparente o de color amarillo plido a los 5 das despus del nacimiento.  Mojar entre 6 y 8 paales cada 24 horas a medida que el beb sigue creciendo y desarrollndose.  Defeca al menos 3 veces en 24 horas a los 5 [address] de [address] La materia fecal debe ser blanda y [place].  Defeca al menos 3 veces en 24 horas a los [address] de [address] La materia fecal debe ser grumosa y [place].  No registra una prdida de peso mayor del 10% del peso al nacer durante los primeros 3 [address]  [address] de [place].  Aumenta de peso un promedio de 4 a 7onzas (113 a 198g) por semana despus de los 4 [address] de vida.  Aumenta de  [place], [place], de [place] uniforme a Glass blower/designer de los 5 [address] de vida, sin Passenger transport manager prdida de peso despus de las 2semanas de vida. Despus de alimentarse, es posible que el beb regurgite una pequea cantidad. Esto es frecuente. FRECUENCIA Y DURACIN DE LA LACTANCIA MATERNA El amamantamiento frecuente la ayudar a producir ms [place] y a Education officer, community de Engineer, mining en los pezones e hinchazn en las [place]. Alimente al beb cuando muestre signos de hambre o si siente la necesidad de reducir la congestin de las [place]. Esto se denomina "lactancia a demanda". Evite el uso del chupete mientras trabaja para establecer la lactancia (las primeras 4 a 6 semanas despus del nacimiento del beb). Despus de este perodo, podr ofrecerle un chupete. Las investigaciones demostraron que el uso del chupete durante el primer ao de vida del beb disminuye el riesgo de desarrollar el sndrome de muerte sbita del lactante (SMSL). Permita que el nio se alimente en cada mama todo lo que desee. Contine amamantando al beb hasta que haya terminado de alimentarse. Cuando el beb se desprende o se queda dormido mientras se est alimentando de la primera mama, ofrzcale la segunda. Debido a que, con frecuencia, los recin [place] las primeras semanas de vida, es posible que deba despertar al beb para alimentarlo. Los horarios de Acupuncturist de un beb a otro. Sin embargo, las siguientes reglas pueden servir como gua para ayudarla a Lawyer que el beb se alimenta adecuadamente:  Se puede amamantar a los recin nacidos (bebs de 4 semanas o menos de vida) cada 1 a 3 horas.  No deben transcurrir ms de 3 horas durante el da o 5 horas durante la noche sin que se amamante a los recin nacidos.  Debe amamantar al beb 8 veces como mnimo en un  perodo de 24 horas, hasta que comience a introducir slidos en su dieta, a los 6 meses de vida aproximadamente. EXTRACCIN DE [employer] Company MATERNA La  extraccin y Contractor de la leche materna le permiten asegurarse de que el beb se alimente exclusivamente de [place], aun en momentos en los que no puede amamantar. Esto tiene especial importancia si debe regresar al Aleen Campi en el perodo en que an est amamantando o si no puede estar presente en los momentos en que el beb debe alimentarse. Su asesor en lactancia puede orientarla sobre cunto tiempo es seguro almacenar [place]. El sacaleche es un aparato que le permite extraer leche de la mama a un recipiente estril. Luego, la leche materna extrada puede almacenarse en un refrigerador o Electrical engineer. Algunos sacaleches son Birdie Riddle, Delaney Meigs otros son elctricos. Consulte a su asesor en lactancia qu tipo ser ms conveniente para usted. Los sacaleches se pueden comprar; sin embargo, algunos hospitales y grupos de apoyo a la lactancia materna alquilan Sports coach. Un asesor en lactancia puede ensearle cmo extraer [employer], en caso de que prefiera no usar un sacaleche. CMO CUIDAR LAS MAMAS DURANTE LA LACTANCIA MATERNA Los pezones se secan, agrietan y duelen durante la Tour manager. Las siguientes recomendaciones pueden ayudarla a Pharmacologist las [employer] Partners y sanas:  Careers information officer usar jabn en los pezones.  Use un sostn de soporte. Aunque no son esenciales, las camisetas sin mangas o los sostenes especiales para Museum/gallery exhibitions officer estn diseados para acceder fcilmente a las mamas, para Museum/gallery exhibitions officer sin tener que quitarse todo el sostn o la camiseta. Evite usar sostenes con aro o sostenes muy ajustados.  Seque al aire sus pezones durante 3 a despus de amamantar al beb.  Utilice solo apsitos de Haematologist sostn para Environmental health practitioner las prdidas de [place]. La prdida de un poco de Public [employer] Enterprise Group tomas es normal.  Utilice lanolina sobre los pezones luego de Museum/gallery exhibitions officer. La lanolina ayuda a mantener la humedad normal de la piel. Si [place]  lanolina pura, no tiene que lavarse los pezones antes de volver a Corporate treasurer al beb. La lanolina pura no es txica para el beb. Adems, puede extraer [employer] algunas gotas de [place] materna y Engineer, maintenance (IT) suavemente esa [employer], para que la [place] se seque al aire. Durante las primeras semanas despus de dar a luz, algunas mujeres pueden experimentar hinchazn en las mamas (congestin [place]). La congestin puede hacer que sienta las mamas pesadas, calientes y sensibles al tacto. El pico de la congestin ocurre dentro de los 3 a 5 das despus del [place]. Las siguientes recomendaciones pueden ayudarla a Paramedic la congestin:  Vace por completo las mamas al [employer] o Environmental health practitioner. Puede aplicar calor hmedo en las mamas (en la ducha o con toallas hmedas para manos) antes de Museum/gallery exhibitions officer o extraer [employer]. Esto aumenta la circulacin y [place] a que la [place]. Si el beb no vaca por completo las [address] cuando lo [address], extraiga la [place] restante despus de que haya finalizado.  Use un sostn ajustado (para amamantar o comn) o una camiseta sin mangas durante 1 o 2 das para indicar al cuerpo que disminuya ligeramente la produccin de [place].  Aplique compresas de hielo [employer], a menos que le resulte demasiado incmodo.  Asegrese de que el beb est prendido y se encuentre en la posicin correcta mientras lo alimenta. Si la congestin persiste luego de 48 horas o despus de seguir estas recomendaciones, comunquese con su mdico o  un Holiday representativeasesor en lactancia. RECOMENDACIONES GENERALES PARA EL CUIDADO DE LA SALUD DURANTE LA LACTANCIA MATERNA  Consuma alimentos saludables. Alterne comidas y colaciones, y coma 3 de cada una por da. Dado que lo que come Danaher Corporationafecta la leche materna, es posible que algunas comidas hagan que su beb se vuelva ms irritable de lo habitual. Evite comer este tipo de alimentos si percibe que afectan de manera negativa al beb.  Beba leche, jugos de fruta y  agua para Patent examinersatisfacer su sed (aproximadamente 10 vasos al Futures traderda).  Descanse con frecuencia, reljese y tome sus vitaminas prenatales para evitar la fatiga, el estrs y la anemia.  Contine con los autocontroles de la mama.  Evite Product managermasticar y fumar tabaco. Las sustancias qumicas de los cigarrillos que pasan a la leche materna y la exposicin al humo ambiental del tabaco pueden daar al beb.  No consuma alcohol ni drogas, incluida la marihuana. Algunos medicamentos, que pueden ser perjudiciales para el beb, pueden pasar a travs de la Colgate Palmoliveleche materna. Es importante que consulte a su mdico antes de Medical sales representativetomar cualquier medicamento, incluidos todos los medicamentos recetados y de [place] libre, as como los suplementos vitamnicos y herbales. Puede quedar embarazada durante la lactancia. Si desea controlar la natalidad, consulte a su mdico cules son las opciones ms seguras para el beb. SOLICITE ATENCIN MDICA SI:  Usted siente que quiere dejar de Museum/gallery exhibitions officeramamantar o se siente frustrada con la lactancia.  Siente dolor en las mamas o en los pezones.  Sus pezones estn agrietados o Water quality scientistsangran.  Sus pechos estn irritados, sensibles o calientes.  Tiene un rea hinchada en cualquiera de las mamas.  Siente escalofros o fiebre.  Tiene nuseas o vmitos.  Presenta una secrecin de otro lquido distinto de la leche materna de los pezones.  Sus mamas no se llenan antes de Museum/gallery exhibitions officeramamantar al beb para el quinto da despus del [place].  Se siente triste y deprimida.  El beb est demasiado somnoliento como para comer bien.  El beb tiene problemas para dormir.  Moja menos de 3 paales en 24 horas.  Defeca menos de 3 veces en 24 horas.  La piel del beb o la parte blanca de los ojos se vuelven amarillentas.  El beb no ha aumentado de [place] a los [address] das de [place]. SOLICITE ATENCIN MDICA DE INMEDIATO SI:  El beb est muy cansado Retail buyer(letargo) y no se quiere despertar para comer.  Le sube la fiebre sin  causa. Esta informacin no tiene Theme park managercomo fin reemplazar el consejo del mdico. Asegrese de hacerle al mdico cualquier pregunta que tenga. Document Released: 05/15/2005 Document Revised: 09/06/2015 Document Reviewed: 11/06/2012 Elsevier Interactive Patient Education  2017 [employer]

## 2016-08-24 ENCOUNTER — Other Ambulatory Visit: Payer: Self-pay | Admitting: General Practice

## 2016-08-24 ENCOUNTER — Telehealth: Payer: Self-pay | Admitting: General Practice

## 2016-08-24 DIAGNOSIS — O09299 Supervision of pregnancy with other poor reproductive or obstetric history, unspecified trimester: Secondary | ICD-10-CM

## 2016-08-24 DIAGNOSIS — O09292 Supervision of pregnancy with other poor reproductive or obstetric history, second trimester: Secondary | ICD-10-CM

## 2016-08-24 NOTE — Telephone Encounter (Signed)
Per Dr. Shawnie PonsPratt patient needs follow up ultra sound for cervical length next week. Scheduled for April 4th at 0915. Called patient with [facility] for interpreter, no answer unable to leave voicemail.

## 2016-08-29 NOTE — Telephone Encounter (Signed)
Called pt with [facility] interpreter Lindsey Wells # [phone] and informed her of ultrasound scheduled on 4/4 @ 0915 for the purpose of checking cervical measurement. Pt agreed to appt and had no questions.

## 2016-08-30 ENCOUNTER — Encounter (HOSPITAL_COMMUNITY): Payer: Self-pay

## 2016-08-30 ENCOUNTER — Ambulatory Visit (HOSPITAL_COMMUNITY)
Admission: RE | Admit: 2016-08-30 | Discharge: 2016-08-30 | Disposition: A | Discharge status: Home / Self Care | Payer: Self-pay | Source: Ambulatory Visit | Attending: Family Medicine | Admitting: Family Medicine

## 2016-08-30 VITALS — BP 107/54 | HR 70 | Wt 195.8 lb

## 2016-08-30 DIAGNOSIS — O34219 Maternal care for unspecified type scar from previous cesarean delivery: Secondary | ICD-10-CM | POA: Insufficient documentation

## 2016-08-30 DIAGNOSIS — O09292 Supervision of pregnancy with other poor reproductive or obstetric history, second trimester: Secondary | ICD-10-CM | POA: Insufficient documentation

## 2016-08-30 DIAGNOSIS — O09299 Supervision of pregnancy with other poor reproductive or obstetric history, unspecified trimester: Secondary | ICD-10-CM

## 2016-08-30 DIAGNOSIS — O3432 Maternal care for cervical incompetence, second trimester: Secondary | ICD-10-CM | POA: Insufficient documentation

## 2016-08-30 DIAGNOSIS — Z3A23 23 weeks gestation of pregnancy: Secondary | ICD-10-CM | POA: Insufficient documentation

## 2016-08-30 DIAGNOSIS — O26879 Cervical shortening, unspecified trimester: Secondary | ICD-10-CM

## 2016-08-30 DIAGNOSIS — O09212 Supervision of pregnancy with history of pre-term labor, second trimester: Secondary | ICD-10-CM | POA: Insufficient documentation

## 2016-08-31 ENCOUNTER — Other Ambulatory Visit: Payer: Self-pay | Admitting: Family Medicine

## 2016-08-31 MED ORDER — PROGESTERONE MICRONIZED 200 MG PO CAPS: ORAL_CAPSULE | ORAL | 3 refills | Status: DC

## 2016-09-01 ENCOUNTER — Telehealth: Payer: Self-pay | Admitting: *Deleted

## 2016-09-05 ENCOUNTER — Telehealth: Payer: Self-pay | Admitting: *Deleted

## 2016-09-05 NOTE — Telephone Encounter (Signed)
Per message from Dr. Adrian Blackwater need to call patient and notify us shows shortened cervix with funneling. MFM reccommends prometrium which has been sent to her pharmacy.  I called Lindsey Wells with [employer] interpreter [phone] and notified her of these results and reccomendation. She states she has already went to the pharmacy and started taking the medicine. She asked about [place] she has tomorrow. Per chart review I informed her MFM recommended follow up US in one week to check on cervix length. I reviewed her next ob appt with her. She voices understanding.

## 2016-09-06 ENCOUNTER — Other Ambulatory Visit (HOSPITAL_COMMUNITY): Payer: Self-pay | Admitting: Maternal and Fetal Medicine

## 2016-09-06 ENCOUNTER — Encounter (HOSPITAL_COMMUNITY): Payer: Self-pay

## 2016-09-06 ENCOUNTER — Ambulatory Visit (HOSPITAL_COMMUNITY)
Admission: RE | Admit: 2016-09-06 | Discharge: 2016-09-06 | Disposition: A | Discharge status: Home / Self Care | Payer: Self-pay | Source: Ambulatory Visit | Attending: Family Medicine | Admitting: Family Medicine

## 2016-09-06 DIAGNOSIS — Z3A24 24 weeks gestation of pregnancy: Secondary | ICD-10-CM | POA: Insufficient documentation

## 2016-09-06 DIAGNOSIS — O34219 Maternal care for unspecified type scar from previous cesarean delivery: Secondary | ICD-10-CM | POA: Insufficient documentation

## 2016-09-06 DIAGNOSIS — O26872 Cervical shortening, second trimester: Secondary | ICD-10-CM | POA: Insufficient documentation

## 2016-09-06 DIAGNOSIS — O09899 Supervision of other high risk pregnancies, unspecified trimester: Secondary | ICD-10-CM

## 2016-09-06 DIAGNOSIS — O09219 Supervision of pregnancy with history of pre-term labor, unspecified trimester: Secondary | ICD-10-CM

## 2016-09-06 DIAGNOSIS — O26879 Cervical shortening, unspecified trimester: Secondary | ICD-10-CM

## 2016-09-06 DIAGNOSIS — O09212 Supervision of pregnancy with history of pre-term labor, second trimester: Secondary | ICD-10-CM | POA: Insufficient documentation

## 2016-09-06 DIAGNOSIS — O3432 Maternal care for cervical incompetence, second trimester: Secondary | ICD-10-CM | POA: Insufficient documentation

## 2016-09-07 ENCOUNTER — Other Ambulatory Visit (HOSPITAL_COMMUNITY): Payer: Self-pay | Admitting: *Deleted

## 2016-09-07 DIAGNOSIS — O26879 Cervical shortening, unspecified trimester: Secondary | ICD-10-CM

## 2016-09-12 ENCOUNTER — Ambulatory Visit (INDEPENDENT_AMBULATORY_CARE_PROVIDER_SITE_OTHER): Payer: Medicaid Other | Admitting: Family Medicine

## 2016-09-12 VITALS — BP 113/62 | HR 68 | Wt 195.7 lb

## 2016-09-12 DIAGNOSIS — O09292 Supervision of pregnancy with other poor reproductive or obstetric history, second trimester: Secondary | ICD-10-CM | POA: Diagnosis not present

## 2016-09-12 DIAGNOSIS — O34219 Maternal care for unspecified type scar from previous cesarean delivery: Secondary | ICD-10-CM | POA: Diagnosis not present

## 2016-09-12 DIAGNOSIS — O0992 Supervision of high risk pregnancy, unspecified, second trimester: Secondary | ICD-10-CM | POA: Diagnosis not present

## 2016-09-12 DIAGNOSIS — O09299 Supervision of pregnancy with other poor reproductive or obstetric history, unspecified trimester: Secondary | ICD-10-CM

## 2016-09-12 NOTE — Patient Instructions (Addendum)
Segundo trimestre de embarazo (Second Trimester of Pregnancy) El segundo trimestre va desde la semana13 hasta la 28, desde el cuarto hasta el sexto mes, y suele ser el momento en el que mejor se siente. Su organismo se ha adaptado a estar embarazada y comienza a sentirse fsicamente mejor. En general, las nuseas matutinas han disminuido o han desaparecido completamente, puede tener ms energa y un aumento de apetito. El segundo trimestre es tambin la poca en la que el feto se desarrolla rpidamente. Hacia el final del sexto mes, el feto mide aproximadamente 9pulgadas (23cm) y pesa alrededor de 1 libras (700g). Es probable que sienta que el beb se mueve (da pataditas) entre las 18 y 20semanas del embarazo. CAMBIOS EN EL ORGANISMO Su organismo atraviesa por muchos cambios durante el embarazo, y estos varan de una mujer a otra.  Seguir aumentando de peso. Notar que la parte baja del abdomen sobresale.  Podrn aparecer las primeras estras en las caderas, el abdomen y las mamas.  Es posible que tenga dolores de cabeza que pueden aliviarse con los medicamentos que el mdico le permita tomar.  Tal vez tenga necesidad de orinar con ms frecuencia porque el feto est ejerciendo presin sobre la vejiga.  Debido al embarazo podr sentir acidez estomacal con frecuencia.  Puede estar estreida, ya que ciertas hormonas enlentecen los movimientos de los msculos que empujan los desechos a travs de los intestinos.  Pueden aparecer hemorroides o abultarse e hincharse las venas (venas varicosas).  Puede tener dolor de espalda que se debe al aumento de peso y a que las hormonas del embarazo relajan las articulaciones entre los huesos de la pelvis, y como consecuencia de la modificacin del peso y los msculos que mantienen el equilibrio.  Las mamas seguirn creciendo y le dolern.  Las encas pueden sangrar y estar sensibles al cepillado y al hilo dental.  Pueden aparecer zonas oscuras o  manchas (cloasma, mscara del embarazo) en el rostro que probablemente se atenuar despus del nacimiento del beb.  Es posible que se forme una lnea oscura desde el ombligo hasta la zona del pubis (linea nigra) que probablemente se atenuar despus del nacimiento del beb.  Tal vez haya cambios en el cabello que pueden incluir su engrosamiento, crecimiento rpido y cambios en la textura. Adems, a algunas mujeres se les cae el cabello durante o despus del embarazo, o tienen el cabello seco o fino. Lo ms probable es que el cabello se le normalice despus del nacimiento del beb. QU DEBE ESPERAR EN LAS CONSULTAS PRENATALES Durante una visita prenatal de rutina:  La pesarn para asegurarse de que usted y el feto estn creciendo normalmente.  Le tomarn la presin arterial.  Le medirn el abdomen para controlar el desarrollo del beb.  Se escucharn los latidos cardacos fetales.  Se evaluarn los resultados de los estudios solicitados en visitas anteriores. El mdico puede preguntarle lo siguiente:  Cmo se siente.  Si siente los movimientos del beb.  Si ha tenido sntomas anormales, como prdida de lquido, sangrado, dolores de cabeza intensos o clicos abdominales.  Si est consumiendo algn producto que contenga tabaco, como cigarrillos, tabaco de mascar y cigarrillos electrnicos.  Si tiene alguna pregunta. Otros estudios que podrn realizarse durante el segundo trimestre incluyen lo siguiente:  Anlisis de sangre para detectar lo siguiente:  Concentraciones de hierro bajas (anemia).  Diabetes gestacional (entre la semana 24 y la 28).  Anticuerpos Rh.  Anlisis de orina para detectar infecciones, diabetes o protenas en la orina.    Una ecografa para confirmar que el beb crece y se desarrolla correctamente.  Una amniocentesis para diagnosticar posibles problemas genticos.  Estudios del feto para descartar espina bfida y sndrome de Down.  Prueba del VIH (virus  de inmunodeficiencia humana). Los exmenes prenatales de rutina incluyen la prueba de deteccin del VIH, a menos que decida no realizrsela. INSTRUCCIONES PARA EL CUIDADO EN EL HOGAR  Evite fumar, consumir hierbas, beber alcohol y tomar frmacos que no le hayan recetado. Estas sustancias qumicas afectan la formacin y el desarrollo del beb.  No consuma ningn producto que contenga tabaco, lo que incluye cigarrillos, tabaco de mascar y cigarrillos electrnicos. Si necesita ayuda para dejar de fumar, consulte al mdico. Puede recibir asesoramiento y otro tipo de recursos para dejar de fumar.  Siga las indicaciones del mdico en relacin con el uso de medicamentos. Durante el embarazo, hay medicamentos que son seguros de tomar y otros que no.  Haga ejercicio solamente como se lo haya indicado el mdico. Sentir clicos uterinos es un buen signo para detener la actividad fsica.  Contine comiendo alimentos sanos con regularidad.  Use un sostn que le brinde buen soporte si le duelen las mamas.  No se d baos de inmersin en agua caliente, baos turcos ni saunas.  Use el cinturn de seguridad en todo momento mientras conduce.  No coma carne cruda ni queso sin cocinar; evite el contacto con las bandejas sanitarias de los gatos y la tierra que estos animales usan. Estos elementos contienen grmenes que pueden causar defectos congnitos en el beb.  Tome las vitaminas prenatales.  Tome entre 1500 y 2000mg de calcio diariamente comenzando en la semana20 del embarazo hasta el parto.  Si est estreida, pruebe un laxante suave (si el mdico lo autoriza). Consuma ms alimentos ricos en fibra, como vegetales y frutas frescos y cereales integrales. Beba gran cantidad de lquido para mantener la orina de tono claro o color amarillo plido.  Dese baos de asiento con agua tibia para aliviar el dolor o las molestias causadas por las hemorroides. Use una crema para las hemorroides si el mdico la  autoriza.  Si tiene venas varicosas, use medias de descanso. Eleve los pies durante 15minutos, 3 o 4veces por da. Limite el consumo de sal en su dieta.  No levante objetos pesados, use zapatos de tacones bajos y mantenga una buena postura.  Descanse con las piernas elevadas si tiene calambres o dolor de cintura.  Visite a su dentista si an no lo ha hecho durante el embarazo. Use un cepillo de dientes blando para higienizarse los dientes y psese el hilo dental con suavidad.  Puede seguir manteniendo relaciones sexuales, a menos que el mdico le indique lo contrario.  Concurra a todas las visitas prenatales segn las indicaciones de su mdico. SOLICITE ATENCIN MDICA SI:  Tiene mareos.  Siente clicos leves, presin en la pelvis o dolor persistente en el abdomen.  Tiene nuseas, vmitos o diarrea persistentes.  Observa una secrecin vaginal con mal olor.  Siente dolor al orinar. SOLICITE ATENCIN MDICA DE INMEDIATO SI:  Tiene fiebre.  Tiene una prdida de lquido por la vagina.  Tiene sangrado o pequeas prdidas vaginales.  Siente dolor intenso o clicos en el abdomen.  Sube o baja de peso rpidamente.  Tiene dificultad para respirar y siente dolor de pecho.  Sbitamente se le hinchan mucho el rostro, las manos, los tobillos, los pies o las piernas.  No ha sentido los movimientos del beb durante una hora.  Siente un   dolor de cabeza intenso que no se alivia con medicamentos.  Su visin se modifica. Esta informacin no tiene Theme park manager el consejo del mdico. Asegrese de hacerle al mdico cualquier pregunta que tenga. Document Released: 02/22/2005 Document Revised: 06/05/2014 Document Reviewed: 07/16/2012 Elsevier Interactive Patient Education  2017 Elsevier Inc.   [place] materna (Breastfeeding) Decidir Museum/gallery exhibitions officer es una de las mejores elecciones que puede hacer por usted y su beb. El cambio hormonal durante el Psychiatrist produce el desarrollo del  tejido mamario y [place] la cantidad y el tamao de los conductos galactforos. Estas hormonas tambin permiten que las protenas, los azcares y las grasas de la sangre produzcan la [employer] materna en las glndulas productoras de [place]. Las hormonas impiden que la leche materna sea liberada antes del nacimiento del beb, adems de impulsar el flujo de leche luego del nacimiento. Una vez que ha comenzado a Museum/gallery exhibitions officer, Conservation officer, nature beb, as Immunologist succin o Theatre manager, pueden estimular la liberacin de [place] de las glndulas productoras de [place]. LOS BENEFICIOS DE AMAMANTAR Para el beb  La primera leche (calostro) ayuda a Careers information officer funcionamiento del sistema digestivo del beb.  La leche tiene anticuerpos que ayudan a Radio producer las infecciones en el beb.  El beb tiene una menor incidencia de asma, alergias y del sndrome de muerte sbita del lactante.  Los nutrientes en la [place] materna son mejores para el beb que la [place] maternizada y estn preparados exclusivamente para cubrir las necesidades del beb.  La leche materna mejora el desarrollo cerebral del beb.  Es menos probable que el beb desarrolle otras enfermedades, como obesidad infantil, asma o diabetes mellitus de tipo 2. Para usted  La lactancia materna favorece el desarrollo de un vnculo muy especial entre la madre y el beb.  Es conveniente. La leche materna siempre est disponible a la Human resources officer y es [place].  La lactancia materna ayuda a quemar caloras y a perder el peso ganado durante el [place].  Favorece la contraccin del tero al tamao que tena antes del embarazo de manera ms rpida y disminuye el sangrado (loquios) despus del parto.  La lactancia materna contribuye a reducir Nurse, adult de desarrollar diabetes mellitus de tipo 2, osteoporosis o cncer de mama o de ovario en el futuro. SIGNOS DE QUE EL BEB EST HAMBRIENTO Primeros signos de [address] de [place].  Se  estira.  Mueve la cabeza de un lado a otro.  Mueve la cabeza y abre la boca cuando se le toca la mejilla o la comisura de la boca (reflejo de bsqueda).  Aumenta las vocalizaciones, tales como sonidos de succin, se relame los labios, emite arrullos, suspiros, o chirridos.  Mueve la [employer] boca.  Se chupa con ganas los dedos o las manos. Signos tardos de [employer].  Llora de manera intermitente. Signos de [employer] Corporation signos de hambre extrema requerirn que lo calme y lo consuele antes de que el beb pueda alimentarse adecuadamente. No espere a que se manifiesten los siguientes signos de hambre extrema para comenzar a Museum/gallery exhibitions officer:  Designer, jewellery.  Llanto intenso y fuerte.  Gritos. INFORMACIN BSICA SOBRE LA LACTANCIA MATERNA Iniciacin de la lactancia materna  Encuentre un lugar cmodo para sentarse o acostarse, con un buen respaldo para el cuello y la espalda.  Coloque una almohada o una manta enrollada debajo del beb para acomodarlo a la altura de la mama (si est sentada). Las almohadas para Museum/gallery exhibitions officer se han  diseado especialmente a fin de servir de apoyo para los brazos y el beb [address].  Asegrese de que el abdomen del beb est frente al suyo.  Masajee suavemente la mama. Con las yemas de los dedos, masajee la pared del pecho hacia el pezn en un movimiento circular. Esto estimula el flujo de [place]. Es posible que Engineer, manufacturing systems este movimiento mientras amamanta si la leche fluye lentamente.  Sostenga la mama con el pulgar por arriba del pezn y los otros 4 dedos por debajo de la mama. Asegrese de que los dedos se encuentren lejos del pezn y de la boca del beb.  Empuje suavemente los labios del beb con el pezn o con el dedo.  Cuando la boca del beb se abra lo suficiente, acrquelo rpidamente a la mama e introduzca todo el pezn y la zona oscura que lo rodea (areola), tanto como sea posible, dentro de la boca del beb.  Debe haber  ms areola visible por arriba del labio superior del beb que por debajo del labio inferior.  La lengua del beb debe estar entre la enca inferior y la [place].  Asegrese de que la boca del beb est en la posicin correcta alrededor del pezn (prendida). Los labios del beb deben crear un sello sobre la mama y estar doblados hacia afuera (invertidos).  Es comn que el beb succione durante 2 a 3 minutos para que comience el flujo de [place]. Cmo debe prenderse Es muy importante que le ensee al beb cmo prenderse adecuadamente a la mama. Si el beb no se prende adecuadamente, puede causarle dolor en el pezn y reducir la produccin de [place], y hacer que el beb tenga un escaso aumento de [place]. Adems, si el beb no se prende adecuadamente al pezn, puede tragar aire durante la alimentacin. Esto puede causarle molestias al beb. Hacer eructar al beb al [place] de mama puede ayudarlo a liberar el aire. Sin embargo, ensearle al beb cmo prenderse a la mama adecuadamente es la mejor manera de evitar que se sienta molesto por tragar Oceanographer se alimenta. Signos de que el beb se ha prendido adecuadamente al pezn:  Payton Doughty o succiona de modo silencioso, sin causarle dolor.  Se escucha que traga cada 3 o 4 succiones.  Hay movimientos musculares por arriba y por delante de sus odos al Printmaker. Signos de que el beb no se ha prendido Audiological scientist al pezn:  Hace ruidos de succin o de chasquido mientras se alimenta.  Siente dolor en el pezn. Si cree que el beb no se prendi correctamente, deslice el dedo en la comisura de la boca y [employer] las encas del beb para interrumpir la succin. Intente comenzar a amamantar nuevamente. Signos de Fish farm manager Signos del beb:  Disminuye gradualmente el nmero de succiones o cesa la succin por completo.  Se duerme.  Relaja el cuerpo.  Retiene una pequea cantidad de [facility] boca.  Se desprende  solo del pecho. Signos que presenta usted:  Las mamas han aumentado la firmeza, el peso y el tamao 1 a 3 horas despus de Museum/gallery exhibitions officer.  Estn ms blandas inmediatamente despus de amamantar.  Un aumento del volumen de [place], y tambin un cambio en su consistencia y color se producen hacia el quinto da de Tour manager.  Los pezones no duelen, ni estn agrietados ni sangran. Signos de que su beb recibe la cantidad de [employer] suficiente  Mojar por lo menos 1 o 2 paales [employer]  primeras 24 horas despus del nacimiento.  Mojar por lo menos 5 o 6 paales cada 24 horas durante la primera semana despus del nacimiento. La orina debe ser transparente o de color amarillo plido a los 5 das despus del nacimiento.  Mojar entre 6 y 8 paales cada 24 horas a medida que el beb sigue creciendo y desarrollndose.  Defeca al menos 3 veces en 24 horas a los 5 [address]  [address] de [address] La materia fecal debe ser blanda y [place].  Defeca al menos 3 veces en 24 horas a los [address] de [address] La materia fecal debe ser grumosa y [place].  No registra una prdida de peso mayor del 10% del peso al nacer durante los primeros 3 [address]  [address] de [place].  Aumenta de peso un promedio de 4 a 7onzas (113 a 198g) por semana despus de los 4 [address]  [address] de vida.  Aumenta de [place], [place], de [place] uniforme a Glass blower/designer de los 5 [address]  [address] de vida, sin Passenger transport manager prdida de peso despus de las 2semanas de vida. Despus de alimentarse, es posible que el beb regurgite una pequea cantidad. Esto es frecuente. FRECUENCIA Y DURACIN DE LA LACTANCIA MATERNA El amamantamiento frecuente la ayudar a producir ms [place] y a Education officer, community de Engineer, mining en los pezones e hinchazn en las [place]. Alimente al beb cuando muestre signos de hambre o si siente la necesidad de reducir la congestin de las [place]. Esto se denomina "lactancia a demanda". Evite el uso del chupete mientras trabaja para establecer la lactancia (las primeras 4 a 6 semanas despus  del nacimiento del beb). Despus de este perodo, podr ofrecerle un chupete. Las investigaciones demostraron que el uso del chupete durante el primer ao de vida del beb disminuye el riesgo de desarrollar el sndrome de muerte sbita del lactante (SMSL). Permita que el nio se alimente en cada mama todo lo que desee. Contine amamantando al beb hasta que haya terminado de alimentarse. Cuando el beb se desprende o se queda dormido mientras se est alimentando de la primera mama, ofrzcale la segunda. Debido a que, con frecuencia, los recin [place] las primeras semanas de vida, es posible que deba despertar al beb para alimentarlo. Los horarios de Acupuncturist de un beb a otro. Sin embargo, las siguientes reglas pueden servir como gua para ayudarla a Lawyer que el beb se alimenta adecuadamente:  Se puede amamantar a los recin nacidos (bebs de 4 semanas o menos de vida) cada 1 a 3 horas.  No deben transcurrir ms de 3 horas durante el da o 5 horas durante la noche sin que se amamante a los recin nacidos.  Debe amamantar al beb 8 veces como mnimo en un perodo de 24 horas, hasta que comience a introducir slidos en su dieta, a los 6 meses de vida aproximadamente. EXTRACCIN DE [employer] MATERNA La extraccin y Contractor de la leche materna le permiten asegurarse de que el beb se alimente exclusivamente de [place], aun en momentos en los que no puede amamantar. Esto tiene especial importancia si debe regresar al Aleen Campi en el perodo en que an est amamantando o si no puede estar presente en los momentos en que el beb debe alimentarse. Su asesor en lactancia puede orientarla sobre cunto tiempo es seguro almacenar [place]. El sacaleche es un aparato que le permite extraer leche de la mama a un recipiente estril. Luego, la leche materna extrada puede almacenarse en un refrigerador o Electrical engineer. Algunos sacaleches son Birdie Riddle, Delaney Meigs  otros son  elctricos. Consulte a su asesor en lactancia qu tipo ser ms conveniente para usted. Los sacaleches se pueden comprar; sin embargo, algunos hospitales y grupos de apoyo a la lactancia materna alquilan Sports coachsacaleches mensualmente. Un asesor en lactancia puede ensearle cmo extraer W. R. Berkleyleche materna manualmente, en caso de que prefiera no usar un sacaleche. CMO CUIDAR LAS MAMAS DURANTE LA LACTANCIA MATERNA Los pezones se secan, agrietan y duelen durante la Tour managerlactancia materna. Las siguientes recomendaciones pueden ayudarla a Pharmacologistmantener las TEPPCO Partnersmamas humectadas y sanas:  Careers information officervite usar jabn en los pezones.  Use un sostn de soporte. Aunque no son esenciales, las camisetas sin mangas o los sostenes especiales para Museum/gallery exhibitions officeramamantar estn diseados para acceder fcilmente a las mamas, para Museum/gallery exhibitions officeramamantar sin tener que quitarse todo el sostn o la camiseta. Evite usar sostenes con aro o sostenes muy ajustados.  Seque al aire sus pezones durante 3 a 4minutos despus de amamantar al beb.  Utilice solo apsitos de Haematologistalgodn en el sostn para Environmental health practitionerabsorber las prdidas de [place]. La prdida de un poco de Public Service Enterprise Groupleche materna entre las tomas es normal.  Utilice lanolina sobre los pezones luego de Museum/gallery exhibitions officeramamantar. La lanolina ayuda a mantener la humedad normal de la piel. Si Botswanausa lanolina pura, no tiene que lavarse los pezones antes de volver a Corporate treasureralimentar al beb. La lanolina pura no es txica para el beb. Adems, puede extraer Beazer Homesmanualmente algunas gotas de [place]leche materna y Engineer, maintenance (IT)masajear suavemente esa Winn-Dixieleche sobre los pezones, para que la [place]leche se seque al aire. Durante las primeras semanas despus de dar a luz, algunas mujeres pueden experimentar hinchazn en las mamas (congestin [place] Jefferson Stationmamaria). La congestin puede hacer que sienta las mamas pesadas, calientes y sensibles al tacto. El pico de la congestin ocurre dentro de los 3 a 5 das despus del [place]. Las siguientes recomendaciones pueden ayudarla a Paramedicaliviar la congestin:  Vace por completo las mamas al  QUALCOMMamamantar o Environmental health practitionerextraer leche. Puede aplicar calor hmedo en las mamas (en la ducha o con toallas hmedas para manos) antes de Museum/gallery exhibitions officeramamantar o extraer WPS Resourcesleche. Esto aumenta la circulacin y [place] and the Grenadinesayuda a que la [place]leche fluya. Si el beb no vaca por completo las [address] D[address]amas cuando lo [address] Aveamamanta, extraiga la [place]leche restante despus de que haya finalizado.  Use un sostn ajustado (para amamantar o comn) o una camiseta sin mangas durante 1 o 2 das para indicar al cuerpo que disminuya ligeramente la produccin de [place].  Aplique compresas de hielo Yahoo! Incsobre las mamas, a menos que le resulte demasiado incmodo.  Asegrese de que el beb est prendido y se encuentre en la posicin correcta mientras lo alimenta. Si la congestin persiste luego de 48 horas o despus de seguir estas recomendaciones, comunquese con su mdico o un Holiday representativeasesor en lactancia. RECOMENDACIONES GENERALES PARA EL CUIDADO DE LA SALUD DURANTE LA LACTANCIA MATERNA  Consuma alimentos saludables. Alterne comidas y colaciones, y coma 3 de cada una por da. Dado que lo que come Danaher Corporationafecta la leche materna, es posible que algunas comidas hagan que su beb se vuelva ms irritable de lo habitual. Evite comer este tipo de alimentos si percibe que afectan de manera negativa al beb.  Beba leche, jugos de fruta y agua para Patent examinersatisfacer su sed (aproximadamente 10 vasos al Futures traderda).  Descanse con frecuencia, reljese y tome sus vitaminas prenatales para evitar la fatiga, el estrs y la anemia.  Contine con los autocontroles de la mama.  Evite Product managermasticar y fumar tabaco. Las sustancias qumicas de los cigarrillos que pasan a la [place]leche materna y la exposicin al humo  ambiental del tabaco pueden daar al beb.  No consuma alcohol ni drogas, incluida la marihuana. Algunos medicamentos, que pueden ser perjudiciales para el beb, pueden pasar a travs de la [employer]. Es importante que consulte a su mdico antes de Medical sales representative, incluidos todos los medicamentos recetados y de  [place], as como los suplementos vitamnicos y herbales. Puede quedar embarazada durante la lactancia. Si desea controlar la natalidad, consulte a su mdico cules son las opciones ms seguras para el beb. SOLICITE ATENCIN MDICA SI:  Usted siente que quiere dejar de Museum/gallery exhibitions officer o se siente frustrada con la lactancia.  Siente dolor en las mamas o en los pezones.  Sus pezones estn agrietados o Water quality scientist.  Sus pechos estn irritados, sensibles o calientes.  Tiene un rea hinchada en cualquiera de las mamas.  Siente escalofros o fiebre.  Tiene nuseas o vmitos.  Presenta una secrecin de otro lquido distinto de la leche materna de los pezones.  Sus mamas no se llenan antes de Museum/gallery exhibitions officer al beb para el quinto da despus del [place].  Se siente triste y deprimida.  El beb est demasiado somnoliento como para comer bien.  El beb tiene problemas para dormir.  Moja menos de 3 paales en 24 horas.  Defeca menos de 3 veces en 24 horas.  La piel del beb o la parte blanca de los ojos se vuelven amarillentas.  El beb no ha aumentado de [place] a los [address] de [place]. SOLICITE ATENCIN MDICA DE INMEDIATO SI:  El beb est muy cansado Retail buyer) y no se quiere despertar para comer.  Le sube la fiebre sin causa. Esta informacin no tiene Theme park manager el consejo del mdico. Asegrese de hacerle al mdico cualquier pregunta que tenga. Document Released: 05/15/2005 Document Revised: 09/06/2015 Document Reviewed: 11/06/2012 Elsevier Interactive Patient Education  2017 Elsevier Inc. Parto vaginal despus de Eustace Quail (Vaginal Birth After Cesarean Delivery) Un parto vaginal despus de un parto por cesrea es dar a luz por la vagina despus de haber dado a luz por medio de una intervencin [place]. En el pasado, si una mujer tena un beb por cesrea, todos los partos posteriores deban hacerse por cesrea. Esto ya no es as. Puede ser seguro para la mam intentar un parto  vaginal luego de una cesrea. Es importante que converse con su mdico desde comienzos del Psychiatrist de modo que pueda Google, beneficios y opciones. Le dar tiempo para decidir qu es lo mejor en su caso particular. La decisin final de tener un parto vaginal o por cesrea debe tomarse en conjunto, entre usted y el mdico. Cualquier cambio en su salud o la de su beb durante el embarazo puede ser motivo de un cambio de decisin respecto del parto vaginal. LAS MUJERES QUE OPTAN POR EL PARTO VAGINAL, DEBEN CONSULTAR AL MDICO PARA ASEGURARSE DE QUE:  La cesrea anterior se haya realizado con un corte (incisin) uterino transversal (no con una incisin vertical clsica).  El canal de parto es lo suficientemente grande como para que pase el [place].  No ha sido sometida a otras operaciones del tero.  Durante el trabajo de parto, le realizarn un monitoreo fetal Forensic scientist, en todo momento.  Habr un quirfano disponible y listo en caso de necesitar una cesrea de emergencia.  Un mdico y personal de quirfano estarn disponibles en todo momento durante el [place] de parto, para realizar una cesrea en caso de ser necesario.  Habr un anestesista disponible en caso de necesitar Neomia Dear  cesrea de emergencia.  La nursery est lista y cuenta con personal especializado y el equipo disponible para cuidar al beb en caso de emergencia. BENEFICIOS DEL PARTO VAGINAL:  Permanencia ms breve en el hospital.  Prevencin de los riesgos asociados con el parto por cesrea, por ejemplo:  Complicaciones quirrgicas, como apertura o hernia de la incisin.  Lesiones en otros rganos.  Grant Ruts. Esto puede ocurrir si aparece una infeccin despus de la ciruga. Tambin puede ocurrir como reaccin a los medicamentos administrados para adormecerla durante la [place].  Menos prdida de sangre y menos probabilidad de necesitar una transfusin sangunea.  Menor riesgo de cogulos sanguneos e  infeccin.  Tiempo ms corto de recuperacin.  Menor riesgo de remocin del tero (histerectoma).  Menor riesgo de que la placenta cubra parcial o completamente la abertura del tero (placenta previa) en embarazos futuros.  Menos riesgos en el [place] de parto y [place] futuros. RIESGOS  Ruptura del tero. Esto ocurre en menos del 1% de los partos vaginales. El riesgo de que eso suceda es mayor si:  Se toman medidas para iniciar el proceso del [place] de parto (inducir Engineer, manufacturing systems) o Risk manager o intensificar las contracciones (aumentar el trabajo de [place]).  Se usan medicamentos para ablandar (madurar) el cuello del tero.  Es necesario extraer el tero (histerectoma) si se rompe. No debe llevarse a cabo si:  La cesrea previa se realiz con una incisin vertical (clsica) o con forma de T, o usted no sabe cul de [employer] han practicado.  Ha sufrido ruptura del tero.  Ha tenido ciertos tipos de Leisure centre manager en el tero, como la extirpacin de fibromas uterinos. Pregntele a su mdico sobre otros tipos de cirugas que le impiden tener un parto vaginal.  Tiene ciertos problemas mdicos o relacionados con el parto (obsttricos).  El beb est en problemas.  Tuvo dos cesreas previas y ningn parto vaginal. OTRAS COSAS QUE DEBE SABER:  La anestesia peridural es segura.  Es seguro dar vuelta al beb si se encuentra de nalgas (intentar una versin ceflica externa).  Es seguro intentarlo en caso de mellizos.  El parto vaginal puede no ser apropiado si el beb pesa 8,8lb (4kg) o ms. Sin embargo, las predicciones de [place] no son siempre exactas y no deben ser lo nico a tenerse en cuenta para decidir si el parto vaginal es lo indicado para usted.  Hay aumento en el porcentaje de fracasos si el intervalo entre la cesrea y el parto vaginal es de menos de 19 meses.  Su mdico puede aconsejarle no tener un parto vaginal si tiene preeclampsia (hipertensin, protena en la orina e  hinchazn en la cara y las extremidades).  El parto vaginal suele ser exitoso si ya tuvo un parto vaginal previamente.  Tambin suele ser exitoso cuando el trabajo de parto comienza espontneamente antes de la fecha.  El parto vaginal despus de Eustace Quail es similar a un parto espontneo vaginal normal. Esta informacin no tiene Theme park manager el consejo del mdico. Asegrese de hacerle al mdico cualquier pregunta que tenga. Document Released: 11/01/2007 Document Revised: 03/05/2013 Document Reviewed: 12/12/2012 Elsevier Interactive Patient Education  2017 [employer].

## 2016-09-12 NOTE — Progress Notes (Signed)
Discussed breast feeding tip of the week about good latch. Pt has previously breastfed and didn't have any questions about topic. Pt informed that at next visit in 3-4 weeks she will need to come fasting to do her 2hr gtt.

## 2016-09-12 NOTE — Progress Notes (Signed)
   PRENATAL VISIT NOTE  Subjective:  Lindsey Wells is a 36 y.o. G3P1101 at [redacted]w[redacted]d being seen today for ongoing prenatal care.  She is currently monitored for the following issues for this high-risk pregnancy and has Supervision of high-risk pregnancy; History of incompetent cervix, currently pregnant; History of premature rupture of membranes in previous pregnancy, currently pregnant; and Previous cesarean delivery affecting pregnancy, antepartum on her problem list.  Patient reports no complaints.  Contractions: Not present. Vag. Bleeding: None.  Movement: Present. Denies leaking of fluid.   The following portions of the patient's history were reviewed and updated as appropriate: allergies, current medications, past family history, past medical history, past social history, past surgical history and problem list. Problem list updated.  Objective:   Vitals:   09/12/16 0845  BP: 113/62  Pulse: 68  Weight: 195 lb 11.2 oz (88.8 kg)    Fetal Status: Fetal Heart Rate (bpm): 179 Fundal Height: 25 cm Movement: Present     General:  Alert, oriented and cooperative. Patient is in no acute distress.  Skin: Skin is warm and dry. No rash noted.   Cardiovascular: Normal heart rate noted  Respiratory: Normal respiratory effort, no problems with respiration noted  Abdomen: Soft, gravid, appropriate for gestational age. Pain/Pressure: Absent     Pelvic:  Cervical exam deferred        Extremities: Normal range of motion.  Edema: None  Mental Status: Normal mood and affect. Normal behavior. Normal judgment and thought content.   Assessment and Plan:  Pregnancy: G3P1101 at [redacted]w[redacted]d  1. Supervision of high risk pregnancy in second trimester 28 wk labs next visit  2. History of incompetent cervix, currently pregnant Serial cervical lengths-scheduled next at 4/25  3. History of premature rupture of membranes in previous pregnancy, currently pregnant in second trimester No s/sx's of  PTL--signs reviewed  4. Previous cesarean delivery affecting pregnancy, antepartum Desires TOLAC--information given today--review next visit.  Preterm labor symptoms and general obstetric precautions including but not limited to vaginal bleeding, contractions, leaking of fluid and fetal movement were reviewed in detail with the patient. Please refer to After Visit Summary for other counseling recommendations.  Return in 3 weeks (on 10/03/2016) for HRC, 28 wk labs.   Reva Bores, MD

## 2016-09-13 NOTE — Telephone Encounter (Signed)
Entered in error

## 2016-09-20 ENCOUNTER — Other Ambulatory Visit (HOSPITAL_COMMUNITY): Payer: Self-pay | Admitting: *Deleted

## 2016-09-20 ENCOUNTER — Encounter (HOSPITAL_COMMUNITY): Payer: Self-pay

## 2016-09-20 ENCOUNTER — Other Ambulatory Visit (HOSPITAL_COMMUNITY): Payer: Self-pay | Admitting: Maternal and Fetal Medicine

## 2016-09-20 ENCOUNTER — Ambulatory Visit (HOSPITAL_COMMUNITY)
Admission: RE | Admit: 2016-09-20 | Discharge: 2016-09-20 | Disposition: A | Discharge status: Home / Self Care | Payer: Self-pay | Source: Ambulatory Visit | Attending: Family Medicine | Admitting: Family Medicine

## 2016-09-20 DIAGNOSIS — O26879 Cervical shortening, unspecified trimester: Secondary | ICD-10-CM

## 2016-09-20 DIAGNOSIS — O09212 Supervision of pregnancy with history of pre-term labor, second trimester: Secondary | ICD-10-CM

## 2016-09-20 DIAGNOSIS — O3432 Maternal care for cervical incompetence, second trimester: Secondary | ICD-10-CM | POA: Insufficient documentation

## 2016-09-20 DIAGNOSIS — O34219 Maternal care for unspecified type scar from previous cesarean delivery: Secondary | ICD-10-CM | POA: Insufficient documentation

## 2016-09-20 DIAGNOSIS — Z3689 Encounter for other specified antenatal screening: Secondary | ICD-10-CM | POA: Insufficient documentation

## 2016-09-20 DIAGNOSIS — O99212 Obesity complicating pregnancy, second trimester: Secondary | ICD-10-CM

## 2016-09-20 DIAGNOSIS — Z3A26 26 weeks gestation of pregnancy: Secondary | ICD-10-CM | POA: Insufficient documentation

## 2016-09-20 DIAGNOSIS — O26872 Cervical shortening, second trimester: Secondary | ICD-10-CM | POA: Insufficient documentation

## 2016-09-27 ENCOUNTER — Ambulatory Visit (HOSPITAL_COMMUNITY)
Admission: RE | Admit: 2016-09-27 | Discharge: 2016-09-27 | Disposition: A | Discharge status: Home / Self Care | Payer: Self-pay | Source: Ambulatory Visit | Attending: Family Medicine | Admitting: Family Medicine

## 2016-09-27 ENCOUNTER — Encounter (HOSPITAL_COMMUNITY): Payer: Self-pay

## 2016-09-27 ENCOUNTER — Other Ambulatory Visit (HOSPITAL_COMMUNITY): Payer: Self-pay | Admitting: Obstetrics and Gynecology

## 2016-09-27 DIAGNOSIS — O09292 Supervision of pregnancy with other poor reproductive or obstetric history, second trimester: Secondary | ICD-10-CM

## 2016-09-27 DIAGNOSIS — Z3A27 27 weeks gestation of pregnancy: Secondary | ICD-10-CM | POA: Insufficient documentation

## 2016-09-27 DIAGNOSIS — O0992 Supervision of high risk pregnancy, unspecified, second trimester: Secondary | ICD-10-CM

## 2016-09-27 DIAGNOSIS — O34219 Maternal care for unspecified type scar from previous cesarean delivery: Secondary | ICD-10-CM

## 2016-09-27 DIAGNOSIS — O26879 Cervical shortening, unspecified trimester: Secondary | ICD-10-CM

## 2016-09-27 DIAGNOSIS — O26872 Cervical shortening, second trimester: Secondary | ICD-10-CM | POA: Insufficient documentation

## 2016-09-27 DIAGNOSIS — O09299 Supervision of pregnancy with other poor reproductive or obstetric history, unspecified trimester: Secondary | ICD-10-CM

## 2016-09-27 DIAGNOSIS — O99212 Obesity complicating pregnancy, second trimester: Secondary | ICD-10-CM | POA: Insufficient documentation

## 2016-09-27 DIAGNOSIS — O3432 Maternal care for cervical incompetence, second trimester: Secondary | ICD-10-CM

## 2016-09-27 DIAGNOSIS — O09899 Supervision of other high risk pregnancies, unspecified trimester: Secondary | ICD-10-CM

## 2016-09-27 DIAGNOSIS — O09219 Supervision of pregnancy with history of pre-term labor, unspecified trimester: Secondary | ICD-10-CM

## 2016-10-05 ENCOUNTER — Encounter: Payer: Self-pay | Admitting: Obstetrics & Gynecology

## 2016-10-05 ENCOUNTER — Ambulatory Visit (INDEPENDENT_AMBULATORY_CARE_PROVIDER_SITE_OTHER): Payer: Self-pay | Admitting: Obstetrics & Gynecology

## 2016-10-05 VITALS — BP 107/59 | HR 71 | Wt 197.2 lb

## 2016-10-05 DIAGNOSIS — O09293 Supervision of pregnancy with other poor reproductive or obstetric history, third trimester: Secondary | ICD-10-CM

## 2016-10-05 DIAGNOSIS — Z23 Encounter for immunization: Secondary | ICD-10-CM

## 2016-10-05 DIAGNOSIS — O0993 Supervision of high risk pregnancy, unspecified, third trimester: Secondary | ICD-10-CM

## 2016-10-05 NOTE — Patient Instructions (Signed)

## 2016-10-05 NOTE — Progress Notes (Signed)
   PRENATAL VISIT NOTE  Subjective:  Lindsey Wells is a 36 y.o. G3P1101 at 7563w1d being seen today for ongoing prenatal care.  She is currently monitored for the following issues for this high-risk pregnancy and has Supervision of high-risk pregnancy; History of incompetent cervix, currently pregnant; History of premature rupture of membranes in previous pregnancy, currently pregnant; and Previous cesarean delivery affecting pregnancy, antepartum on her problem list.  Patient reports no complaints.  Contractions: Not present. Vag. Bleeding: None.  Movement: Present. Denies leaking of fluid.   The following portions of the patient's history were reviewed and updated as appropriate: allergies, current medications, past family history, past medical history, past social history, past surgical history and problem list. Problem list updated.  Objective:   Vitals:   10/05/16 0753  BP: (!) 107/59  Pulse: 71  Weight: 197 lb 3.2 oz (89.4 kg)    Fetal Status: Fetal Heart Rate (bpm): 130   Movement: Present     General:  Alert, oriented and cooperative. Patient is in no acute distress.  Skin: Skin is warm and dry. No rash noted.   Cardiovascular: Normal heart rate noted  Respiratory: Normal respiratory effort, no problems with respiration noted  Abdomen: Soft, gravid, appropriate for gestational age. Pain/Pressure: Absent     Pelvic:  Cervical exam deferred        Extremities: Normal range of motion.  Edema: Trace  Mental Status: Normal mood and affect. Normal behavior. Normal judgment and thought content.   Assessment and Plan:  Pregnancy: G3P1101 at 5463w1d  1. Supervision of high risk pregnancy in third trimester @ hr GTT today and 28 week labs  2. History of premature rupture of membranes in previous pregnancy, currently pregnant in third trimester Prometrium daily  Preterm labor symptoms and general obstetric precautions including but not limited to vaginal bleeding,  contractions, leaking of fluid and fetal movement were reviewed in detail with the patient. Please refer to After Visit Summary for other counseling recommendations.  Return in about 2 weeks (around 10/19/2016).   Adam PhenixArnold, Lindsey Arthurs G, MD

## 2016-10-06 LAB — HIV ANTIBODY (ROUTINE TESTING W REFLEX): HIV SCREEN 4TH GENERATION: NONREACTIVE

## 2016-10-06 LAB — GLUCOSE TOLERANCE, 2 HOURS W/ 1HR
GLUCOSE, 1 HOUR: 135 mg/dL (ref 65–179)
GLUCOSE, 2 HOUR: 98 mg/dL (ref 65–152)
GLUCOSE, FASTING: 78 mg/dL (ref 65–91)

## 2016-10-06 LAB — RPR: RPR: NONREACTIVE

## 2016-10-06 LAB — CBC
Hematocrit: 33.5 % — ABNORMAL LOW (ref 34.0–46.6)
Hemoglobin: 11.6 g/dL (ref 11.1–15.9)
MCH: 29.7 pg (ref 26.6–33.0)
MCHC: 34.6 g/dL (ref 31.5–35.7)
MCV: 86 fL (ref 79–97)
PLATELETS: 226 10*3/uL (ref 150–379)
RBC: 3.91 x10E6/uL (ref 3.77–5.28)
RDW: 13.4 % (ref 12.3–15.4)
WBC: 7 10*3/uL (ref 3.4–10.8)

## 2016-10-18 ENCOUNTER — Encounter (HOSPITAL_COMMUNITY): Payer: Self-pay

## 2016-10-18 ENCOUNTER — Ambulatory Visit (HOSPITAL_COMMUNITY)
Admission: RE | Admit: 2016-10-18 | Discharge: 2016-10-18 | Disposition: A | Discharge status: Home / Self Care | Payer: Self-pay | Source: Ambulatory Visit | Attending: Family Medicine | Admitting: Family Medicine

## 2016-10-18 DIAGNOSIS — O26879 Cervical shortening, unspecified trimester: Secondary | ICD-10-CM | POA: Insufficient documentation

## 2016-10-18 DIAGNOSIS — Z3A30 30 weeks gestation of pregnancy: Secondary | ICD-10-CM | POA: Insufficient documentation

## 2016-10-24 ENCOUNTER — Ambulatory Visit (INDEPENDENT_AMBULATORY_CARE_PROVIDER_SITE_OTHER): Payer: Self-pay | Admitting: Advanced Practice Midwife

## 2016-10-24 DIAGNOSIS — O09299 Supervision of pregnancy with other poor reproductive or obstetric history, unspecified trimester: Secondary | ICD-10-CM

## 2016-10-24 DIAGNOSIS — O34219 Maternal care for unspecified type scar from previous cesarean delivery: Secondary | ICD-10-CM

## 2016-10-24 DIAGNOSIS — O09293 Supervision of pregnancy with other poor reproductive or obstetric history, third trimester: Secondary | ICD-10-CM

## 2016-10-24 DIAGNOSIS — O0993 Supervision of high risk pregnancy, unspecified, third trimester: Secondary | ICD-10-CM

## 2016-10-24 NOTE — Progress Notes (Signed)
   PRENATAL VISIT NOTE  Subjective:  Lindsey Wells is a 36 y.o. G3P1101 at 5449w6d being seen today for ongoing prenatal care.  She is currently monitored for the following issues for this high-risk pregnancy and has Supervision of high-risk pregnancy; History of incompetent cervix, currently pregnant; History of premature rupture of membranes in previous pregnancy, currently pregnant; and Previous cesarean delivery affecting pregnancy, antepartum on her problem list.  Patient reports occasional contractions.  Contractions: Not present. Vag. Bleeding: None.  Movement: Present. Denies leaking of fluid.   The following portions of the patient's history were reviewed and updated as appropriate: allergies, current medications, past family history, past medical history, past social history, past surgical history and problem list. Problem list updated.  Objective:   Vitals:   10/24/16 0803  BP: 106/70  Pulse: 76  Weight: 200 lb 14.4 oz (91.1 kg)    Fetal Status: Fetal Heart Rate (bpm): 125 Fundal Height: 31 cm Movement: Present     General:  Alert, oriented and cooperative. Patient is in no acute distress.  Skin: Skin is warm and dry. No rash noted.   Cardiovascular: Normal heart rate noted  Respiratory: Normal respiratory effort, no problems with respiration noted  Abdomen: Soft, gravid, appropriate for gestational age. Pain/Pressure: Absent     Pelvic:  Cervical exam deferred        Extremities: Normal range of motion.  Edema: None  Mental Status: Normal mood and affect. Normal behavior. Normal judgment and thought content.   Assessment and Plan:  Pregnancy: G3P1101 at 6249w6d  1. Previous cesarean delivery affecting pregnancy, antepartum  - Discussed TOLAC. Planning repeat c/S. Consent signed.   Preterm labor symptoms and general obstetric precautions including but not limited to vaginal bleeding, contractions, leaking of fluid and fetal movement were reviewed in detail with  the patient. Please refer to After Visit Summary for other counseling recommendations.  Return in about 2 weeks (around 11/07/2016) for ROB.   Dorathy KinsmanVirginia Khloei Wells, CNM

## 2016-10-24 NOTE — Patient Instructions (Addendum)
Eleccin del mtodo anticonceptivo (Contraception Choices) La anticoncepcin (control de la natalidad) es el uso de cualquier mtodo o dispositivo para Location managerevitar el embarazo. A continuacin se indican algunos de esos mtodos. MTODOS HORMONALES  El Implante contraconceptivo consiste en un tubo plstico delgado que contiene la hormona progesterona. No contiene estrgenos. El mdico inserta el tubo en la parte interna del brazo. El tubo puede Geneticist, molecularpermanecer en el lugar durante 3 aos. Despus de los 3 aos debe retirarse. El implante impide que los ovarios liberen vulos (ovulacin), espesa el moco cervical, lo que evita que los espermatozoides ingresen al tero y hace ms delgada la membrana que cubre el interior del tero.  Inyecciones de progesterona sola: las Insurance underwriteradministra el mdico cada 3 meses para Location managerevitar el embarazo. La progesterona sinttica impide que los ovarios liberen vulos. Tambin hacen que el moco cervical se espese y modifique el tejido de recubrimiento interno del tero. Esto hace ms difcil que los espermatozoides sobrevivan en el tero.  Las pldoras anticonceptivas contienen estrgenos y Education officer, museumprogesterona. Su funcin es ALLTEL Corporationevitar que los ovarios liberen vulos (ovulacin). Las hormonas de los anticonceptivos orales hacen que el moco cervical se haga ms espeso, lo que evita que el esperma ingrese al tero. Las pldoras anticonceptivas son recetadas por el mdico.Tambin se utilizan para tratar los perodos menstruales abundantes.  Minipldora: este tipo de pldora anticonceptiva contiene slo hormona progesterona. Deben tomarse todos los [address] del mes y debe recetarlas el mdico.  El parche de control de natalidad: contiene hormonas similares a las que contienen las pldoras anticonceptivas. Deben cambiarse una vez por semana y se utilizan bajo prescripcin mdica.  Anillo vaginal: contiene hormonas similares a las que contienen las pldoras anticonceptivas. Se deja colocado durante tres semanas, se  lo retira durante 1 semana y luego se coloca uno nuevo. La paciente debe sentirse cmoda al insertar y retirar el anillo de la vagina.Es necesaria la prescripcin mdica.  Anticonceptivos de emergencia: son mtodos para evitar un embarazo despus de Neomia Dearuna relacin sexual sin proteccin. Esta pldora puede tomarse inmediatamente despus de Child psychotherapisttener relaciones sexuales o hasta 5 Jeneradas de haber tenido sexo sin proteccin. Es ms efectiva si se toma poco tiempo despus de la relacin sexual. Los anticonceptivos de emergencia estn disponibles sin prescripcin mdica. Consltelo con su farmacutico. No use los anticonceptivos de emergencia como nico mtodo anticonceptivo. MTODOS DE Lenis NoonBARRERA  Condn masculino: es una vaina delgada (ltex o goma) que se coloca cubriendo al pene durante el acto sexual. Deri Fuellinguede usarse con espermicida para aumentar la efectividad.  Condn femenino. Es una funda delicada y blanda que se adapta holgadamente a la vagina antes de las Clinical research associaterelaciones sexuales.  Diafragma: es una barrera de ltex redonda y suave que debe ser recomendado por un profesional. Se inserta en la vagina, junto con un gel espermicida. Debe insertarse antes de Management consultanttener relaciones sexuales. Debe dejar el diafragma colocado en la vagina durante 6 a 8 horas despus de la relacin sexual.  Capuchn cervical: es una barrera de ltex o taza plstica redonda y Bahamassuave que cubre el cuello del tero y debe ser colocada por un mdico. Puede dejarlo colocado en la vagina hasta 48 horas despus de las Clinical research associaterelaciones sexuales.  Esponja: es una pieza blanda y circular de espuma de poliuretano. Contiene un espermicida. Se inserta en la vagina despus de mojarla y antes de las The St. Paul Travelersrelaciones sexuales.  Espermicidas: son sustancias qumicas que matan o bloquean al esperma y no lo dejan ingresar al cuello del tero y al tero. Vienen en forma  de cremas, geles, supositorios, espuma o comprimidos. No es necesario tener Emergency planning/management officer. Se insertan en la  vagina con un aplicador antes de Management consultant. El proceso debe repetirse cada vez que tiene relaciones sexuales. ANTICONCEPTIVOS INTRAUTERINOS  Dispositivo intrauterino (DIU) es un dispositivo en forma de T que se coloca en el tero durante el perodo menstrual, para Location manager. Hay dos tipos:  DIU de cobre: este tipo de DIU est recubierto con un alambre de cobre y se inserta dentro del tero. El cobre hace que el tero y las trompas de Falopio produzcan un liquido que [employer] espermatozoides. Puede permanecer colocado durante 10 aos.  DIU con hormona: este tipo de DIU contiene la hormona progestina (progesterona sinttica). La hormona espesa el moco cervical y evita que los espermatozoides ingresen al tero y tambin afina la membrana que cubre el tero para evitar la implantacin del vulo fertilizado. La hormona debilita o destruye los espermatozoides que ingresan al tero. Puede Geneticist, molecular durante 3-5 aos, segn el tipo de DIU que se [place]. MTODOS ANTICONCEPTIVOS PERMANENTES  Ligadura de trompas en la mujer: se realiza sellando, atando u obstruyendo quirrgicamente las trompas de Falopio lo que impide que el vulo descienda hacia el tero.  Esterilizacin histeroscpica: Implica la colocacin de un pequeo espiral o la insercin en cada trompa de Falopio. El mdico utiliza una tcnica llamada histeroscopa para Primary school teacher procedimiento. El dispositivo produce la formacin de tejido Designer, television/film set. Esto da como resultado una obstruccin permanente de las trompas de Falopio, de modo que la esperma no pueda fertilizar el vulo. Demora alrededor de 3 meses despus del procedimiento hasta que el conducto se obstruye. Tendr que usar otro mtodo anticonceptivo durante al menos 3 meses.  Esterilizacin masculina: se realiza ligando los conductos por los que pasan los espermatozoides (vasectoma).Esto impide que el esperma ingrese a la vagina durante el acto  sexual. Luego del procedimiento, el hombre puede eyacular lquido (semen). MTODOS DE PLANIFICACIN NATURAL  Planificacin familiar natural: consiste en no Management consultant o usar un mtodo de barrera (condn, [place], capuchn cervical) en los [employer] la mujer podra quedar [place].  Mtodo de calendario: consiste en el seguimiento de la duracin de cada ciclo menstrual y la identificacin de los perodos frtiles.  Mtodo de ovulacin: Paramedic las relaciones sexuales durante la ovulacin.  Mtodo sintotrmico: Advertising copywriter sexuales en la poca en la que se est ovulando, utilizando un termmetro y tendiendo en cuenta los sntomas de la ovulacin.  Mtodo postovulacin: Youth worker las relaciones sexuales para despus de haber ovulado. Independientemente del tipo o mtodo anticonceptivo que usted elija, es importante que use condones para protegerse contra las infecciones de transmisin sexual (ETS). Hable con su mdico con respecto a qu mtodo anticonceptivo es el ms apropiado para usted. Esta informacin no tiene Theme park manager el consejo del mdico. Asegrese de hacerle al mdico cualquier pregunta que tenga. Document Released: 05/15/2005 Document Revised: 01/15/2013 Document Reviewed: 11/07/2012 Elsevier Interactive Patient Education  2017 Elsevier [employer].   AREA PEDIATRIC/FAMILY PRACTICE PHYSICIANS  [facility] [address] [place], [place]  [address] Phone - [phone]   Fax - [phone]  [facility] [address] [place], [place] [address] Phone - [phone]   Fax - [phone]  [facility] [address] [place], [place]  [address] Phone - [phone]   Fax - [phone]  [facility] [address] [place], [place]  [address] Phone - [phone]   Fax - [phone]  [facility] [address] [place], [place]  [address] Phone - [phone]   Fax - [phone]  [facility] [address] [place], [place]  [address] Phone - [phone]   Fax - [phone]  [facility] [place] [address] [place], [place]  [address] Phone - [phone]   Fax - [phone]  [facility] AT [facility] [address] [place], [address] [place], [place]  [address] Phone - [phone]   Fax - [phone]  [facility] AT [facility] [address] [place], [place]  [address] Phone - [phone]   Fax - [phone] [facility] AT [place] [address] [place], [place]  [address] Phone - [phone]   Fax - [phone]  [facility] [address] [place], [place]  [address] Phone - [phone]   Fax - [phone]  [facility] AT [facility] [address], [address] [place], [place]  [address] Phone - [phone]   Fax - [phone]  [facility] [address] [place], [place]  [address] Phone - [phone]   Fax - [phone]  [facility] [address], [address] [place], [place]  [address] Phone - [phone]  [facility] [address] [place], [place]  [address] Phone - [phone]   Fax - [phone]  [facility] [address] [place], [place]  [address] Phone - [phone]   Fax - [phone]  [facility] [address] [place], [place]  [address] Phone - [phone]   Fax - [phone]  [facility] [address] [place], [place]  [address] Phone - [phone]   Fax - [phone]   [facility] [address] [place], [place]  [address] Phone - [phone]   Fax - [phone]  [facility] [address] [place], [place]  [address] Phone - [phone]   Fax -  [phone]  DAVID RUBIN [address] [place], [place]  [address] Phone - [phone]   Fax - [phone]  [facility] [address] [place], [place]  [address] Phone - [phone]   Fax - [phone]  [place] [facility] Alita Chyle [address] [place], [place]  [address] Phone - [phone]   Fax - [phone] Gerarda Fraction [address] [place], [place]  [address] Phone - [phone]   Fax - [phone]  [facility] [address] [place], [place]  [address] Phone - [phone]   Fax - [phone]  [facility] - [place] [address] [place], [place]  [address] Phone - [phone]   Fax - [phone]  [facility] -  Wyvonne Lenz MD [address] [place] [address] Phone [phone]  Fax [phone]

## 2016-10-24 NOTE — Progress Notes (Signed)
Breastfeeding education done Spanish interpreter Lindsey Wells here for visit

## 2016-11-06 ENCOUNTER — Ambulatory Visit (INDEPENDENT_AMBULATORY_CARE_PROVIDER_SITE_OTHER): Payer: Self-pay | Admitting: Family Medicine

## 2016-11-06 ENCOUNTER — Encounter (HOSPITAL_COMMUNITY): Payer: Self-pay

## 2016-11-06 VITALS — BP 101/62 | HR 79 | Wt 203.1 lb

## 2016-11-06 DIAGNOSIS — O09293 Supervision of pregnancy with other poor reproductive or obstetric history, third trimester: Secondary | ICD-10-CM

## 2016-11-06 DIAGNOSIS — O0992 Supervision of high risk pregnancy, unspecified, second trimester: Secondary | ICD-10-CM

## 2016-11-06 DIAGNOSIS — Z758 Other problems related to medical facilities and other health care: Secondary | ICD-10-CM | POA: Insufficient documentation

## 2016-11-06 DIAGNOSIS — O09299 Supervision of pregnancy with other poor reproductive or obstetric history, unspecified trimester: Secondary | ICD-10-CM

## 2016-11-06 DIAGNOSIS — Z789 Other specified health status: Secondary | ICD-10-CM

## 2016-11-06 DIAGNOSIS — O0993 Supervision of high risk pregnancy, unspecified, third trimester: Secondary | ICD-10-CM

## 2016-11-06 DIAGNOSIS — O34219 Maternal care for unspecified type scar from previous cesarean delivery: Secondary | ICD-10-CM

## 2016-11-06 MED ORDER — PRENATAL 27-0.8 MG PO TABS: 1.0000 | ORAL_TABLET | Freq: Every day | ORAL | 10 refills | Status: DC

## 2016-11-06 NOTE — Progress Notes (Signed)
   PRENATAL VISIT NOTE  Subjective:  Lindsey Wells is a 36 y.o. G3P1101 at 2683w5d being seen today for ongoing prenatal care.  She is currently monitored for the following issues for this high-risk pregnancy and has Supervision of high-risk pregnancy; History of incompetent cervix, currently pregnant; History of premature rupture of membranes in previous pregnancy, currently pregnant; Previous cesarean delivery affecting pregnancy, antepartum; and Language barrier on her problem list.  Patient reports no complaints.  Contractions: Irregular. Vag. Bleeding: None.  Movement: Present. Denies leaking of fluid.   The following portions of the patient's history were reviewed and updated as appropriate: allergies, current medications, past family history, past medical history, past social history, past surgical history and problem list. Problem list updated.  Objective:   Vitals:   11/06/16 0754  BP: 101/62  Pulse: 79  Weight: 203 lb 1.6 oz (92.1 kg)    Fetal Status: Fetal Heart Rate (bpm): 125 Fundal Height: 33 cm Movement: Present     General:  Alert, oriented and cooperative. Patient is in no acute distress.  Skin: Skin is warm and dry. No rash noted.   Cardiovascular: Normal heart rate noted  Respiratory: Normal respiratory effort, no problems with respiration noted  Abdomen: Soft, gravid, appropriate for gestational age. Pain/Pressure: Present     Pelvic:  Cervical exam deferred        Extremities: Normal range of motion.  Edema: Mild pitting, slight indentation  Mental Status: Normal mood and affect. Normal behavior. Normal judgment and thought content.   Assessment and Plan:  Pregnancy: G3P1101 at 6783w5d  1. Supervision of high risk pregnancy in second trimester Continue prenatal care.  - Prenatal Vit-Fe Fumarate-FA (MULTIVITAMIN-PRENATAL) 27-0.8 MG TABS tablet; Take 1 tablet by mouth daily at 12 noon.  Dispense: 30 each; Refill: 10  2. Language barrier Spanish  interpreter: Lindsey Wells used   3. Previous cesarean delivery affecting pregnancy, antepartum Lengthy discussion about TOLAC and RCS--she has opted for RCS--scheduled today  4. History of incompetent cervix, currently pregnant Continue prometrium  Preterm labor symptoms and general obstetric precautions including but not limited to vaginal bleeding, contractions, leaking of fluid and fetal movement were reviewed in detail with the patient. Please refer to After Visit Summary for other counseling recommendations.  Return in 2 weeks (on 11/20/2016).   Reva Boresanya S Laniqua Torrens, MD

## 2016-11-27 ENCOUNTER — Encounter: Payer: Self-pay | Admitting: Family Medicine

## 2016-11-27 ENCOUNTER — Ambulatory Visit (INDEPENDENT_AMBULATORY_CARE_PROVIDER_SITE_OTHER): Payer: Self-pay | Admitting: Family Medicine

## 2016-11-27 VITALS — BP 111/61 | HR 67 | Wt 206.3 lb

## 2016-11-27 DIAGNOSIS — O09293 Supervision of pregnancy with other poor reproductive or obstetric history, third trimester: Secondary | ICD-10-CM

## 2016-11-27 DIAGNOSIS — O0993 Supervision of high risk pregnancy, unspecified, third trimester: Secondary | ICD-10-CM

## 2016-11-27 DIAGNOSIS — O34219 Maternal care for unspecified type scar from previous cesarean delivery: Secondary | ICD-10-CM

## 2016-11-27 DIAGNOSIS — Z789 Other specified health status: Secondary | ICD-10-CM

## 2016-11-27 NOTE — Progress Notes (Signed)
Spanish interpreter: Video number [id number] Darin EngelsAbraham used   PRENATAL VISIT NOTE  Subjective:  Lindsey Wells is a 36 y.o. G3P1101 at 651w5d being seen today for ongoing prenatal care.  She is currently monitored for the following issues for this high-risk pregnancy and has Supervision of high-risk pregnancy; History of incompetent cervix, currently pregnant; History of premature rupture of membranes in previous pregnancy, currently pregnant; Previous cesarean delivery affecting pregnancy, antepartum; and Language barrier on her problem list.  Patient reports numbness on left leg x 6 days, denies weakness.  Contractions: Not present. Vag. Bleeding: None.  Movement: Present. Denies leaking of fluid.   The following portions of the patient's history were reviewed and updated as appropriate: allergies, current medications, past family history, past medical history, past social history, past surgical history and problem list. Problem list updated.  Objective:   Vitals:   11/27/16 1006  BP: 111/61  Pulse: 67  Weight: 206 lb 4.8 oz (93.6 kg)    Fetal Status: Fetal Heart Rate (bpm): 150 Fundal Height: 34 cm Movement: Present  Presentation: Vertex  General:  Alert, oriented and cooperative. Patient is in no acute distress.  Skin: Skin is warm and dry. No rash noted.   Cardiovascular: Normal heart rate noted  Respiratory: Normal respiratory effort, no problems with respiration noted  Abdomen: Soft, gravid, appropriate for gestational age. Pain/Pressure: Present     Pelvic:  Cervical exam deferred        Extremities: Normal range of motion.  Edema: Trace  Mental Status: Normal mood and affect. Normal behavior. Normal judgment and thought content.   Assessment and Plan:  Pregnancy: G3P1101 at 121w5d  1. Supervision of high risk pregnancy in third trimester Continue prenatal care.   2. History of premature rupture of membranes in previous pregnancy, currently pregnant in third  trimester On Prometrium  3. Previous cesarean delivery affecting pregnancy, antepartum Booked for RCS  4. Language barrier Interpreter used  5. Probable mild sciatica On exam, normal strength and not true numbness, stretches and maternity belt recommended  Preterm labor symptoms and general obstetric precautions including but not limited to vaginal bleeding, contractions, leaking of fluid and fetal movement were reviewed in detail with the patient. Please refer to After Visit Summary for other counseling recommendations.  Return in 1 week (on 12/04/2016).   Reva Boresanya S Kearston Putman, MD

## 2016-11-27 NOTE — Progress Notes (Signed)
Patient complains of having numbness in left leg top front part of thigh the numbness last all day which started 6 days ago.

## 2016-11-27 NOTE — Patient Instructions (Signed)
Tercer trimestre de embarazo (Third Trimester of Pregnancy) El tercer trimestre comprende desde la semana29 hasta la semana42, es decir, desde el mes7 hasta el mes9. El tercer trimestre es un perodo en el que el feto crece rpidamente. Hacia el final del noveno mes, el feto mide alrededor de 20pulgadas (45cm) de largo y pesa entre 6 y 10 libras (2,700 y 4,500kg). CAMBIOS EN EL ORGANISMO Su organismo atraviesa por muchos cambios durante el embarazo, y estos varan de una mujer a otra.  Seguir aumentando de peso. Es de esperar que aumente entre 25 y 35libras (11 y 16kg) hacia el final del embarazo.  Podrn aparecer las primeras estras en las caderas, el abdomen y las mamas.  Puede tener necesidad de orinar con ms frecuencia porque el feto baja hacia la pelvis y ejerce presin sobre la vejiga.  Debido al embarazo podr sentir acidez estomacal con frecuencia.  Puede estar estreida, ya que ciertas hormonas enlentecen los movimientos de los msculos que empujan los desechos a travs de los intestinos.  Pueden aparecer hemorroides o abultarse e hincharse las venas (venas varicosas).  Puede sentir dolor plvico debido al aumento de peso y a que las hormonas del embarazo relajan las articulaciones entre los huesos de la pelvis. El dolor de espalda puede ser consecuencia de la sobrecarga de los msculos que soportan la postura.  Tal vez haya cambios en el cabello que pueden incluir su engrosamiento, crecimiento rpido y cambios en la textura. Adems, a algunas mujeres se les cae el cabello durante o despus del embarazo, o tienen el cabello seco o fino. Lo ms probable es que el cabello se le normalice despus del nacimiento del beb.  Las mamas seguirn creciendo y le dolern. A veces, puede haber una secrecin amarilla de las mamas llamada calostro.  El ombligo puede salir hacia afuera.  Puede sentir que le falta el aire debido a que se expande el tero.  Puede notar que el feto  "baja" o lo siente ms bajo, en el abdomen.  Puede tener una prdida de secrecin mucosa con sangre. Esto suele ocurrir en el trmino de unos pocos das a una semana antes de que comience el trabajo de parto.  El cuello del tero se vuelve delgado y blando (se borra) cerca de la fecha de parto. QU DEBE ESPERAR EN LOS EXMENES PRENATALES Le harn exmenes prenatales cada 2semanas hasta la semana36. A partir de ese momento le harn exmenes semanales. Durante una visita prenatal de rutina:  La pesarn para asegurarse de que usted y el feto estn creciendo normalmente.  Le tomarn la presin arterial.  Le medirn el abdomen para controlar el desarrollo del beb.  Se escucharn los latidos cardacos fetales.  Se evaluarn los resultados de los estudios solicitados en visitas anteriores.  Le revisarn el cuello del tero cuando est prxima la fecha de parto para controlar si este se ha borrado. Alrededor de la semana36, el mdico le revisar el cuello del tero. Al mismo tiempo, realizar un anlisis de las secreciones del tejido vaginal. Este examen es para determinar si hay un tipo de bacteria, estreptococo Grupo B. El mdico le explicar esto con ms detalle. El mdico puede preguntarle lo siguiente:  Cmo le gustara que fuera el parto.  Cmo se siente.  Si siente los movimientos del beb.  Si ha tenido sntomas anormales, como prdida de lquido, sangrado, dolores de cabeza intensos o clicos abdominales.  Si est consumiendo algn producto que contenga tabaco, como cigarrillos, tabaco de mascar y   cigarrillos electrnicos.  Si tiene alguna pregunta. Otros exmenes o estudios de deteccin que pueden realizarse durante el tercer trimestre incluyen lo siguiente:  Anlisis de sangre para controlar los niveles de hierro (anemia).  Controles fetales para determinar su salud, nivel de actividad y crecimiento. Si tiene alguna enfermedad o hay problemas durante el embarazo, le harn  estudios.  Prueba del VIH (virus de inmunodeficiencia humana). Si corre un riesgo alto, pueden realizarle una prueba de deteccin del VIH durante el tercer trimestre del embarazo. FALSO TRABAJO DE PARTO Es posible que sienta contracciones leves e irregulares que finalmente desaparecen. Se llaman contracciones de Braxton Hicks o falso trabajo de parto. Las contracciones pueden durar horas, das o incluso semanas, antes de que el verdadero trabajo de parto se inicie. Si las contracciones ocurren a intervalos regulares, se intensifican o se hacen dolorosas, lo mejor es que la revise el mdico. SIGNOS DE TRABAJO DE PARTO  Clicos de tipo menstrual.  Contracciones cada 5minutos o menos.  Contracciones que comienzan en la parte superior del tero y se extienden hacia abajo, a la zona inferior del abdomen y la espalda.  Sensacin de mayor presin en la pelvis o dolor de espalda.  Una secrecin de mucosidad acuosa o con sangre que sale de la vagina. Si tiene alguno de estos signos antes de la semana37 del embarazo, llame a su mdico de inmediato. Debe concurrir al hospital para que la controlen inmediatamente. INSTRUCCIONES PARA EL CUIDADO EN EL HOGAR  Evite fumar, consumir hierbas, beber alcohol y tomar frmacos que no le hayan recetado. Estas sustancias qumicas afectan la formacin y el desarrollo del beb.  No consuma ningn producto que contenga tabaco, lo que incluye cigarrillos, tabaco de mascar y cigarrillos electrnicos. Si necesita ayuda para dejar de fumar, consulte al mdico. Puede recibir asesoramiento y otro tipo de recursos para dejar de fumar.  Siga las indicaciones del mdico en relacin con el uso de medicamentos. Durante el embarazo, hay medicamentos que son seguros de tomar y otros que no.  Haga ejercicio solamente como se lo haya indicado el mdico. Sentir clicos uterinos es un buen signo para detener la actividad fsica.  Contine comiendo alimentos sanos con  regularidad.  Use un sostn que le brinde buen soporte si le duelen las mamas.  No se d baos de inmersin en agua caliente, baos turcos ni saunas.  Use el cinturn de seguridad en todo momento mientras conduce.  No coma carne cruda ni queso sin cocinar; evite el contacto con las bandejas sanitarias de los gatos y la tierra que estos animales usan. Estos elementos contienen grmenes que pueden causar defectos congnitos en el beb.  Tome las vitaminas prenatales.  Tome entre 1500 y 2000mg de calcio diariamente comenzando en la semana20 del embarazo hasta el parto.  Si est estreida, pruebe un laxante suave (si el mdico lo autoriza). Consuma ms alimentos ricos en fibra, como vegetales y frutas frescos y cereales integrales. Beba gran cantidad de lquido para mantener la orina de tono claro o color amarillo plido.  Dese baos de asiento con agua tibia para aliviar el dolor o las molestias causadas por las hemorroides. Use una crema para las hemorroides si el mdico la autoriza.  Si tiene venas varicosas, use medias de descanso. Eleve los pies durante 15minutos, 3 o 4veces por da. Limite el consumo de sal en su dieta.  Evite levantar objetos pesados, use zapatos de tacones bajos y mantenga una buena postura.  Descanse con las piernas elevadas si tiene   calambres o dolor de cintura.  Visite a su dentista si no lo ha hecho durante el embarazo. Use un cepillo de dientes blando para higienizarse los dientes y psese el hilo dental con suavidad.  Puede seguir manteniendo relaciones sexuales, a menos que el mdico le indique lo contrario.  No haga viajes largos excepto que sea absolutamente necesario y solo con la autorizacin del mdico.  Tome clases prenatales para entender, practicar y hacer preguntas sobre el trabajo de parto y el parto.  Haga un ensayo de la partida al hospital.  Prepare el bolso que llevar al hospital.  Prepare la habitacin del beb.  Concurra a todas  las visitas prenatales segn las indicaciones de su mdico.  SOLICITE ATENCIN MDICA SI:  No est segura de que est en trabajo de parto o de que ha roto la bolsa de las aguas.  Tiene mareos.  Siente clicos leves, presin en la pelvis o dolor persistente en el abdomen.  Tiene nuseas, vmitos o diarrea persistentes.  Observa una secrecin vaginal con mal olor.  Siente dolor al orinar.  SOLICITE ATENCIN MDICA DE INMEDIATO SI:  Tiene fiebre.  Tiene una prdida de lquido por la vagina.  Tiene sangrado o pequeas prdidas vaginales.  Siente dolor intenso o clicos en el abdomen.  Sube o baja de peso rpidamente.  Tiene dificultad para respirar y siente dolor de pecho.  Sbitamente se le hinchan mucho el rostro, las manos, los tobillos, los pies o las piernas.  No ha sentido los movimientos del beb durante una hora.  Siente un dolor de cabeza intenso que no se alivia con medicamentos.  Su visin se modifica.  Esta informacin no tiene como fin reemplazar el consejo del mdico. Asegrese de hacerle al mdico cualquier pregunta que tenga. Document Released: 02/22/2005 Document Revised: 06/05/2014 Document Reviewed: 07/16/2012 Elsevier Interactive Patient Education  2017 [employer]   Lactancia materna (Breastfeeding) Decidir amamantar es una de las mejores elecciones que puede hacer por usted y su beb. El cambio hormonal durante el embarazo produce el desarrollo del tejido mamario y aumenta la cantidad y el tamao de los conductos galactforos. Estas hormonas tambin permiten que las protenas, los azcares y las grasas de la sangre produzcan la leche materna en las glndulas productoras de leche. Las hormonas impiden que la leche materna sea liberada antes del nacimiento del beb, adems de impulsar el flujo de leche luego del nacimiento. Una vez que ha comenzado a amamantar, pensar en el beb, as como la succin o el llanto, pueden estimular la liberacin de leche  de las glndulas productoras de leche. LOS BENEFICIOS DE AMAMANTAR Para el beb  La primera leche (calostro) ayuda a mejorar el funcionamiento del sistema digestivo del beb.  La leche tiene anticuerpos que ayudan a prevenir las infecciones en el beb.  El beb tiene una menor incidencia de asma, alergias y del sndrome de muerte sbita del lactante.  Los nutrientes en la leche materna son mejores para el beb que la leche maternizada y estn preparados exclusivamente para cubrir las necesidades del beb.  La leche materna mejora el desarrollo cerebral del beb.  Es menos probable que el beb desarrolle otras enfermedades, como obesidad infantil, asma o diabetes mellitus de tipo 2. Para usted  La lactancia materna favorece el desarrollo de un vnculo muy especial entre la madre y el beb.  Es conveniente. La leche materna siempre est disponible a la temperatura correcta y es econmica.  La lactancia materna ayuda a quemar caloras y   a perder el peso ganado durante el embarazo.  Favorece la contraccin del tero al tamao que tena antes del embarazo de manera ms rpida y disminuye el sangrado (loquios) despus del parto.  La lactancia materna contribuye a reducir el riesgo de desarrollar diabetes mellitus de tipo 2, osteoporosis o cncer de mama o de ovario en el futuro. SIGNOS DE QUE EL BEB EST HAMBRIENTO Primeros signos de hambre  Aumenta su estado de alerta o actividad.  Se estira.  Mueve la cabeza de un lado a otro.  Mueve la cabeza y abre la boca cuando se le toca la mejilla o la comisura de la boca (reflejo de bsqueda).  Aumenta las vocalizaciones, tales como sonidos de succin, se relame los labios, emite arrullos, suspiros, o chirridos.  Mueve la mano hacia la boca.  Se chupa con ganas los dedos o las manos. Signos tardos de hambre  Est agitado.  Llora de manera intermitente. Signos de hambre extrema Los signos de hambre extrema requerirn que lo calme y  lo consuele antes de que el beb pueda alimentarse adecuadamente. No espere a que se manifiesten los siguientes signos de hambre extrema para comenzar a amamantar:  Agitacin.  Llanto intenso y fuerte.  Gritos. INFORMACIN BSICA SOBRE LA LACTANCIA MATERNA Iniciacin de la lactancia materna  Encuentre un lugar cmodo para sentarse o acostarse, con un buen respaldo para el cuello y la espalda.  Coloque una almohada o una manta enrollada debajo del beb para acomodarlo a la altura de la mama (si est sentada). Las almohadas para amamantar se han diseado especialmente a fin de servir de apoyo para los brazos y el beb mientras amamanta.  Asegrese de que el abdomen del beb est frente al suyo.  Masajee suavemente la mama. Con las yemas de los dedos, masajee la pared del pecho hacia el pezn en un movimiento circular. Esto estimula el flujo de leche. Es posible que deba continuar este movimiento mientras amamanta si la leche fluye lentamente.  Sostenga la mama con el pulgar por arriba del pezn y los otros 4 dedos por debajo de la mama. Asegrese de que los dedos se encuentren lejos del pezn y de la boca del beb.  Empuje suavemente los labios del beb con el pezn o con el dedo.  Cuando la boca del beb se abra lo suficiente, acrquelo rpidamente a la mama e introduzca todo el pezn y la zona oscura que lo rodea (areola), tanto como sea posible, dentro de la boca del beb. ? Debe haber ms areola visible por arriba del labio superior del beb que por debajo del labio inferior. ? La lengua del beb debe estar entre la enca inferior y la mama.  Asegrese de que la boca del beb est en la posicin correcta alrededor del pezn (prendida). Los labios del beb deben crear un sello sobre la mama y estar doblados hacia afuera (invertidos).  Es comn que el beb succione durante 2 a 3 minutos para que comience el flujo de leche materna. Cmo debe prenderse Es muy importante que le ensee al  beb cmo prenderse adecuadamente a la mama. Si el beb no se prende adecuadamente, puede causarle dolor en el pezn y reducir la produccin de leche materna, y hacer que el beb tenga un escaso aumento de peso. Adems, si el beb no se prende adecuadamente al pezn, puede tragar aire durante la alimentacin. Esto puede causarle molestias al beb. Hacer eructar al beb al cambiar de mama puede ayudarlo a liberar el   aire. Sin embargo, ensearle al beb cmo prenderse a la mama adecuadamente es la mejor manera de evitar que se sienta molesto por tragar aire mientras se alimenta. Signos de que el beb se ha prendido adecuadamente al pezn:  Tironea o succiona de modo silencioso, sin causarle dolor.  Se escucha que traga cada 3 o 4 succiones.  Hay movimientos musculares por arriba y por delante de sus odos al succionar. Signos de que el beb no se ha prendido adecuadamente al pezn:  Hace ruidos de succin o de chasquido mientras se alimenta.  Siente dolor en el pezn. Si cree que el beb no se prendi correctamente, deslice el dedo en la comisura de la boca y colquelo entre las encas del beb para interrumpir la succin. Intente comenzar a amamantar nuevamente. Signos de lactancia materna exitosa Signos del beb:  Disminuye gradualmente el nmero de succiones o cesa la succin por completo.  Se duerme.  Relaja el cuerpo.  Retiene una pequea cantidad de leche en la boca.  Se desprende solo del pecho. Signos que presenta usted:  Las mamas han aumentado la firmeza, el peso y el tamao 1 a 3 horas despus de amamantar.  Estn ms blandas inmediatamente despus de amamantar.  Un aumento del volumen de leche, y tambin un cambio en su consistencia y color se producen hacia el quinto da de lactancia materna.  Los pezones no duelen, ni estn agrietados ni sangran. Signos de que su beb recibe la cantidad de leche suficiente  Mojar por lo menos 1 o 2 paales durante las primeras 24  horas despus del nacimiento.  Mojar por lo menos 5 o 6 paales cada 24 horas durante la primera semana despus del nacimiento. La orina debe ser transparente o de color amarillo plido a los 5 das despus del nacimiento.  Mojar entre 6 y 8 paales cada 24 horas a medida que el beb sigue creciendo y desarrollndose.  Defeca al menos 3 veces en 24 horas a los 5 das de vida. La materia fecal debe ser blanda y amarillenta.  Defeca al menos 3 veces en 24 horas a los 7 das de vida. La materia fecal debe ser grumosa y amarillenta.  No registra una prdida de peso mayor del 10% del peso al nacer durante los primeros 3 das de vida.  Aumenta de peso un promedio de 4 a 7onzas (113 a 198g) por semana despus de los 4 das de vida.  Aumenta de peso, diariamente, de manera uniforme a partir de los 5 das de vida, sin registrar prdida de peso despus de las 2semanas de vida. Despus de alimentarse, es posible que el beb regurgite una pequea cantidad. Esto es frecuente. FRECUENCIA Y DURACIN DE LA LACTANCIA MATERNA El amamantamiento frecuente la ayudar a producir ms leche y a prevenir problemas de dolor en los pezones e hinchazn en las mamas. Alimente al beb cuando muestre signos de hambre o si siente la necesidad de reducir la congestin de las mamas. Esto se denomina "lactancia a demanda". Evite el uso del chupete mientras trabaja para establecer la lactancia (las primeras 4 a 6 semanas despus del nacimiento del beb). Despus de este perodo, podr ofrecerle un chupete. Las investigaciones demostraron que el uso del chupete durante el primer ao de vida del beb disminuye el riesgo de desarrollar el sndrome de muerte sbita del lactante (SMSL). Permita que el nio se alimente en cada mama todo lo que desee. Contine amamantando al beb hasta que haya terminado de alimentarse. Cuando   el beb se desprende o se queda dormido mientras se est alimentando de la primera mama, ofrzcale la segunda.  Debido a que, con frecuencia, los recin nacidos permanecen somnolientos las primeras semanas de vida, es posible que deba despertar al beb para alimentarlo. Los horarios de lactancia varan de un beb a otro. Sin embargo, las siguientes reglas pueden servir como gua para ayudarla a garantizar que el beb se alimenta adecuadamente:  Se puede amamantar a los recin nacidos (bebs de 4 semanas o menos de vida) cada 1 a 3 horas.  No deben transcurrir ms de 3 horas durante el da o 5 horas durante la noche sin que se amamante a los recin nacidos.  Debe amamantar al beb 8 veces como mnimo en un perodo de 24 horas, hasta que comience a introducir slidos en su dieta, a los 6 meses de vida aproximadamente. EXTRACCIN DE LECHE MATERNA La extraccin y el almacenamiento de la leche materna le permiten asegurarse de que el beb se alimente exclusivamente de leche materna, aun en momentos en los que no puede amamantar. Esto tiene especial importancia si debe regresar al trabajo en el perodo en que an est amamantando o si no puede estar presente en los momentos en que el beb debe alimentarse. Su asesor en lactancia puede orientarla sobre cunto tiempo es seguro almacenar leche materna. El sacaleche es un aparato que le permite extraer leche de la mama a un recipiente estril. Luego, la leche materna extrada puede almacenarse en un refrigerador o congelador. Algunos sacaleches son manuales, mientras que otros son elctricos. Consulte a su asesor en lactancia qu tipo ser ms conveniente para usted. Los sacaleches se pueden comprar; sin embargo, algunos hospitales y grupos de apoyo a la lactancia materna alquilan sacaleches mensualmente. Un asesor en lactancia puede ensearle cmo extraer leche materna manualmente, en caso de que prefiera no usar un sacaleche. CMO CUIDAR LAS MAMAS DURANTE LA LACTANCIA MATERNA Los pezones se secan, agrietan y duelen durante la lactancia materna. Las siguientes  recomendaciones pueden ayudarla a mantener las mamas humectadas y sanas:  Evite usar jabn en los pezones.  Use un sostn de soporte. Aunque no son esenciales, las camisetas sin mangas o los sostenes especiales para amamantar estn diseados para acceder fcilmente a las mamas, para amamantar sin tener que quitarse todo el sostn o la camiseta. Evite usar sostenes con aro o sostenes muy ajustados.  Seque al aire sus pezones durante 3 a 4minutos despus de amamantar al beb.  Utilice solo apsitos de algodn en el sostn para absorber las prdidas de leche. La prdida de un poco de leche materna entre las tomas es normal.  Utilice lanolina sobre los pezones luego de amamantar. La lanolina ayuda a mantener la humedad normal de la piel. Si usa lanolina pura, no tiene que lavarse los pezones antes de volver a alimentar al beb. La lanolina pura no es txica para el beb. Adems, puede extraer manualmente algunas gotas de leche materna y masajear suavemente esa leche sobre los pezones, para que la leche se seque al aire. Durante las primeras semanas despus de dar a luz, algunas mujeres pueden experimentar hinchazn en las mamas (congestin mamaria). La congestin puede hacer que sienta las mamas pesadas, calientes y sensibles al tacto. El pico de la congestin ocurre dentro de los 3 a 5 das despus del parto. Las siguientes recomendaciones pueden ayudarla a aliviar la congestin:  Vace por completo las mamas al amamantar o extraer leche. Puede aplicar calor hmedo en las mamas (  en la ducha o con toallas hmedas para manos) antes de amamantar o extraer leche. Esto aumenta la circulacin y ayuda a que la leche fluya. Si el beb no vaca por completo las mamas cuando lo amamanta, extraiga la leche restante despus de que haya finalizado.  Use un sostn ajustado (para amamantar o comn) o una camiseta sin mangas durante 1 o 2 das para indicar al cuerpo que disminuya ligeramente la produccin de  leche.  Aplique compresas de hielo sobre las mamas, a menos que le resulte demasiado incmodo.  Asegrese de que el beb est prendido y se encuentre en la posicin correcta mientras lo alimenta. Si la congestin persiste luego de 48 horas o despus de seguir estas recomendaciones, comunquese con su mdico o un asesor en lactancia. RECOMENDACIONES GENERALES PARA EL CUIDADO DE LA SALUD DURANTE LA LACTANCIA MATERNA  Consuma alimentos saludables. Alterne comidas y colaciones, y coma 3 de cada una por da. Dado que lo que come afecta la leche materna, es posible que algunas comidas hagan que su beb se vuelva ms irritable de lo habitual. Evite comer este tipo de alimentos si percibe que afectan de manera negativa al beb.  Beba leche, jugos de fruta y agua para satisfacer su sed (aproximadamente 10 vasos al da).  Descanse con frecuencia, reljese y tome sus vitaminas prenatales para evitar la fatiga, el estrs y la anemia.  Contine con los autocontroles de la mama.  Evite masticar y fumar tabaco. Las sustancias qumicas de los cigarrillos que pasan a la leche materna y la exposicin al humo ambiental del tabaco pueden daar al beb.  No consuma alcohol ni drogas, incluida la marihuana. Algunos medicamentos, que pueden ser perjudiciales para el beb, pueden pasar a travs de la leche materna. Es importante que consulte a su mdico antes de tomar cualquier medicamento, incluidos todos los medicamentos recetados y de venta libre, as como los suplementos vitamnicos y herbales. Puede quedar embarazada durante la lactancia. Si desea controlar la natalidad, consulte a su mdico cules son las opciones ms seguras para el beb. SOLICITE ATENCIN MDICA SI:  Usted siente que quiere dejar de amamantar o se siente frustrada con la lactancia.  Siente dolor en las mamas o en los pezones.  Sus pezones estn agrietados o sangran.  Sus pechos estn irritados, sensibles o calientes.  Tiene un rea  hinchada en cualquiera de las mamas.  Siente escalofros o fiebre.  Tiene nuseas o vmitos.  Presenta una secrecin de otro lquido distinto de la leche materna de los pezones.  Sus mamas no se llenan antes de amamantar al beb para el quinto da despus del parto.  Se siente triste y deprimida.  El beb est demasiado somnoliento como para comer bien.  El beb tiene problemas para dormir.  Moja menos de 3 paales en 24 horas.  Defeca menos de 3 veces en 24 horas.  La piel del beb o la parte blanca de los ojos se vuelven amarillentas.  El beb no ha aumentado de peso a los 5 das de vida.  SOLICITE ATENCIN MDICA DE INMEDIATO SI:  El beb est muy cansado (letargo) y no se quiere despertar para comer.  Le sube la fiebre sin causa.  Esta informacin no tiene como fin reemplazar el consejo del mdico. Asegrese de hacerle al mdico cualquier pregunta que tenga. Document Released: 05/15/2005 Document Revised: 09/06/2015 Document Reviewed: 11/06/2012 Elsevier Interactive Patient Education  2017 [employer]  

## 2016-12-05 ENCOUNTER — Ambulatory Visit (INDEPENDENT_AMBULATORY_CARE_PROVIDER_SITE_OTHER): Payer: Self-pay | Admitting: Advanced Practice Midwife

## 2016-12-05 VITALS — BP 101/53 | HR 74 | Wt 209.0 lb

## 2016-12-05 DIAGNOSIS — O0993 Supervision of high risk pregnancy, unspecified, third trimester: Secondary | ICD-10-CM

## 2016-12-05 DIAGNOSIS — Z113 Encounter for screening for infections with a predominantly sexual mode of transmission: Secondary | ICD-10-CM

## 2016-12-05 NOTE — Progress Notes (Signed)
GBS AND GC/CH TODAY

## 2016-12-05 NOTE — Progress Notes (Signed)
   PRENATAL VISIT NOTE  Subjective:  Lindsey Wells is a 36 y.o. G3P1101 at 4145w6d being seen today for ongoing prenatal care.  She is currently monitored for the following issues for this high-risk pregnancy and has Supervision of high-risk pregnancy; History of incompetent cervix, currently pregnant; History of premature rupture of membranes in previous pregnancy, currently pregnant; Previous cesarean delivery affecting pregnancy, antepartum; and Language barrier on her problem list.  Patient reports no complaints.  Contractions: Not present. Vag. Bleeding: None.  Movement: Present. Denies leaking of fluid.   The following portions of the patient's history were reviewed and updated as appropriate: allergies, current medications, past family history, past medical history, past social history, past surgical history and problem list. Problem list updated.  Spanish video interpreter used.   Objective:   Vitals:   12/05/16 0907  BP: (!) 101/53  Pulse: 74  Weight: 209 lb (94.8 kg)    Fetal Status: Fetal Heart Rate (bpm): 122 Fundal Height: 36 cm Movement: Present  Presentation: Vertex  General:  Alert, oriented and cooperative. Patient is in no acute distress.  Skin: Skin is warm and dry. No rash noted.   Cardiovascular: Normal heart rate noted  Respiratory: Normal respiratory effort, no problems with respiration noted  Abdomen: Soft, gravid, appropriate for gestational age. Pain/Pressure: Present     Pelvic:  Cervical exam performed Dilation: Fingertip Effacement (%): 0 Station: -3  Extremities: Normal range of motion.  Edema: Trace  Mental Status: Normal mood and affect. Normal behavior. Normal judgment and thought content.   Assessment and Plan:  Pregnancy: G3P1101 at 7945w6d  1. Supervision of high risk pregnancy, antepartum, third trimester  - Culture, beta strep (group b only) - Cervicovaginal ancillary only  Term labor symptoms and general obstetric precautions  including but not limited to vaginal bleeding, contractions, leaking of fluid and fetal movement were reviewed in detail with the patient. Please refer to After Visit Summary for other counseling recommendations.  F/U 1 week    [place] Emilo Gras, [place]

## 2016-12-05 NOTE — Patient Instructions (Signed)
Parto por cesrea (Cesarean Delivery) El nacimiento o parto por cesrea es el parto quirrgico de un beb a travs de una incisin en el abdomen y el tero. Se lo puede llamar incisin cesrea. Este procedimiento se puede programar con anticipacin o se puede realizar en una situacin de emergencia. INFORME A SU MDICO:  Cualquier alergia que tenga.  Todos los medicamentos que utiliza, incluidos vitaminas, hierbas, gotas oftlmicas, cremas y medicamentos de venta libre.  Problemas previos que usted o los miembros de su familia hayan tenido con el uso de anestsicos.  Enfermedades de la sangre que tenga.  Si tiene cirugas previas.  Cualquier enfermedad que tenga.  Si usted o algn familiar tiene antecedentes de trombosis venosa profunda (TVP) o embolia pulmonar (EP).  RIESGOS Y COMPLICACIONES En general, se trata de un procedimiento seguro. Sin embargo, pueden ocurrir complicaciones, por ejemplo:  Infeccin.  Hemorragia.  Reacciones alrgicas a los medicamentos.  Daos a otras estructuras u otros rganos.  Cogulos sanguneos.  Lesiones al beb. ANTES DEL PROCEDIMIENTO  Siga las indicaciones del mdico respecto de las restricciones para las comidas o las bebidas.  Siga las indicaciones del mdico respecto de baarse antes del procedimiento para ayudar a reducir el riesgo de infeccin.  Si sabe que va a tener un parto por cesrea, no se rasure la zona pbica. Rasurarse antes del procedimiento puede aumentar el riesgo de infeccin.  Consulte al mdico si debe hacer o no lo siguiente: ? Cambiar o suspender los medicamentos que toma habitualmente. Esto es muy importante si toma medicamentos para la diabetes o anticoagulantes. ? El plan para controlar el dolor. Esto es muy importante si piensa amamantar a su beb. ? Cunto tiempo permanecer en el hospital despus del procedimiento. ? Cualquier inquietud que pueda tener sobre recibir hemoderivados si los necesita durante el  procedimiento. ? Almacenamiento de sangre del cordn, si planea guardar la sangre del cordn umbilical del beb.  Quiz tambin desee preguntarle al mdico lo siguiente: ? Si va a poder sostener o amamantar a su beb mientras an se encuentre en el quirfano. ? Si su beb puede quedarse con usted inmediatamente despus del procedimiento y durante su recuperacin. ? Si un familiar o la persona que usted elija puede ingresar al quirfano y permanecer con usted durante el procedimiento, inmediatamente despus de este y durante su recuperacin.  Haga arreglos para que alguien la lleve a su casa cuando le den el alta hospitalaria.  PROCEDIMIENTO  Se le colocarn monitores fetales en el abdomen para controlar la frecuencia cardaca de su beb y la suya.  Segn el motivo del parto por cesrea, es posible que se le realice un examen fsico o una prueba adicional, como una ecografa.  Le colocarn una va intravenosa (IV) en una de las venas.  Posiblemente le realicen un anlisis de sangre u orina.  Se le administrarn antibiticos para ayudar a prevenir las infecciones.  Se le puede entregar una bata especial de calentamiento para mantener estable la temperatura corporal.  Pueden rasurarle a zona pbica.  Le limpiarn la piel de la zona pbica y de la parte inferior del abdomen con una solucin para destruir las bacterias (antisptico).  Se le puede insertar un catter en la vejiga a travs de la uretra. Esto se hace para drenar la orina durante el procedimiento.  Pueden administrarle uno o ms de los siguientes medicamentos: ? Un medicamento para adormecer la zona (anestesia local). ? Un medicamento que lo har dormir (anestesia general). ? Un medicamento (  anestesia regional) que se le inyecta en la espalda o a travs de un tubo pequeo y delgado que se le coloca en la espalda (anestesia raqudea o anestesia epidural). Esto adormece la parte del cuerpo que est por debajo del lugar de la  inyeccin y le permite permanecer despierta durante el procedimiento. Si llega a sentir nuseas, dgaselo al mdico. Tendr medicamentos disponibles para ayudarla a reducir cualquier nusea que pueda sentir.  Le harn una incisin en el abdomen y luego en el tero.  Si est despierta durante el procedimiento, puede sentir tirones en el abdomen, pero no debera sentir dolor. Si siente dolor, dgaselo al mdico inmediatamente.  Se sacar al beb del tero. Es posible que sienta ms presin o un tirn, mientras esto sucede.  Inmediatamente despus del parto, se secar al beb y se lo mantendr caliente. Podr sostener y amamantar a su beb. Durante este momento, es posible que se pince y corte el cordn umbilical.  Se le extraer la placenta del tero.  Las incisiones se cerrarn con puntos (suturas). Es posible que se apliquen grapas, pegamento para la piel o tiras adhesivas en la incisin del abdomen.  Se colocarn vendas (vendajes) sobre la incisin del abdomen. Este procedimiento puede variar segn el mdico y el hospital. DESPUS DEL PROCEDIMIENTO  Le controlarn con frecuencia la presin arterial, la frecuencia cardaca, la frecuencia respiratoria y el nivel de oxgeno en la sangre hasta que haya desaparecido el efecto de los medicamentos administrados.  Puede seguir recibiendo lquidos o medicamentos por la va intravenosa.  Sentir un poco de dolor. Tendr analgsicos disponibles para ayudarla a controlar el dolor.  Para evitar la formacin de cogulos sanguneos: ? Se le pueden administrar medicamentos. ? Quizs deba usar medias o dispositivos de compresin. ? Se le indicar que camine cuando pueda hacerlo.  El personal del hospital alentar y apoyar que cree lazos con su beb. En el hospital, se le puede permitir que el beb permanezca en la misma habitacin que usted (internacin conjunta) durante la hospitalizacin para fomentar un amamantamiento exitoso.  Se le puede sugerir  que tosa y respire profundamente con frecuencia. Esto ayuda a evitar problemas pulmonares.  Si tiene un catter que le drena la orina, se le quitar lo antes posible despus del procedimiento.  Esta informacin no tiene como fin reemplazar el consejo del mdico. Asegrese de hacerle al mdico cualquier pregunta que tenga. Document Released: 05/15/2005 Document Revised: 09/06/2015 Document Reviewed: 02/23/2015 Elsevier Interactive Patient Education  2017 Elsevier Inc.  

## 2016-12-07 LAB — CERVICOVAGINAL ANCILLARY ONLY
Chlamydia: NEGATIVE
NEISSERIA GONORRHEA: NEGATIVE

## 2016-12-09 LAB — CULTURE, BETA STREP (GROUP B ONLY): Strep Gp B Culture: NEGATIVE

## 2016-12-11 ENCOUNTER — Telehealth (HOSPITAL_COMMUNITY): Payer: Self-pay | Admitting: *Deleted

## 2016-12-11 NOTE — Pre-Procedure Instructions (Signed)
Interpreter number [phone]

## 2016-12-11 NOTE — Telephone Encounter (Signed)
Preadmission screen  

## 2016-12-13 ENCOUNTER — Encounter (HOSPITAL_COMMUNITY): Payer: Self-pay

## 2016-12-13 ENCOUNTER — Ambulatory Visit (INDEPENDENT_AMBULATORY_CARE_PROVIDER_SITE_OTHER): Payer: Self-pay | Admitting: Certified Nurse Midwife

## 2016-12-13 VITALS — BP 113/72 | HR 63 | Wt 207.7 lb

## 2016-12-13 DIAGNOSIS — O34219 Maternal care for unspecified type scar from previous cesarean delivery: Secondary | ICD-10-CM

## 2016-12-13 DIAGNOSIS — O0993 Supervision of high risk pregnancy, unspecified, third trimester: Secondary | ICD-10-CM

## 2016-12-13 NOTE — Progress Notes (Signed)
Subjective:  Eliezer Mccoyallely I Garcia-Diosdado is a 36 y.o. G3P1101 at 1660w0d being seen today for ongoing prenatal care.  She is currently monitored for the following issues for this high-risk pregnancy and has Supervision of high-risk pregnancy; History of incompetent cervix, currently pregnant; History of premature rupture of membranes in previous pregnancy, currently pregnant; Previous cesarean delivery affecting pregnancy, antepartum; and Language barrier on her problem list.  Patient reports no complaints.  Contractions: Not present. Vag. Bleeding: None.  Movement: Present. Denies leaking of fluid.   The following portions of the patient's history were reviewed and updated as appropriate: allergies, current medications, past family history, past medical history, past social history, past surgical history and problem list. Problem list updated.  Objective:   Vitals:   12/13/16 1255  BP: 113/72  Pulse: 63  Weight: 207 lb 11.2 oz (94.2 kg)    Fetal Status: Fetal Heart Rate (bpm): 129 Fundal Height: 37 cm Movement: Present  Presentation: Vertex  General:  Alert, oriented and cooperative. Patient is in no acute distress.  Skin: Skin is warm and dry. No rash noted.   Cardiovascular: Normal heart rate noted  Respiratory: Normal respiratory effort, no problems with respiration noted  Abdomen: Soft, gravid, appropriate for gestational age. Pain/Pressure: Present     Pelvic: Vag. Bleeding: None     Cervical exam deferred        Extremities: Normal range of motion.  Edema: Mild pitting, slight indentation  Mental Status: Normal mood and affect. Normal behavior. Normal judgment and thought content.   Urinalysis:      Assessment and Plan:  Pregnancy: G3P1101 at 4160w0d  1. Previous cesarean delivery affecting pregnancy, antepartum - RCS scheduled for 12/20/16 - Labor precautions  Term labor symptoms and general obstetric precautions including but not limited to vaginal bleeding, contractions,  leaking of fluid and fetal movement were reviewed in detail with the patient. Please refer to After Visit Summary for other counseling recommendations.  No Follow-up on file.   Donette LarryBhambri, Keishawna Carranza, CNM

## 2016-12-13 NOTE — Patient Instructions (Signed)

## 2016-12-13 NOTE — Pre-Procedure Instructions (Signed)
[phone] Interpreter number

## 2016-12-18 ENCOUNTER — Encounter: Payer: Self-pay | Admitting: Certified Nurse Midwife

## 2016-12-19 ENCOUNTER — Encounter (HOSPITAL_COMMUNITY)
Admission: RE | Admit: 2016-12-19 | Discharge: 2016-12-19 | Disposition: A | Discharge status: Home / Self Care | Payer: Medicaid Other | Source: Ambulatory Visit | Attending: Obstetrics & Gynecology | Admitting: Obstetrics & Gynecology

## 2016-12-19 LAB — CBC
HCT: 36.4 % (ref 36.0–46.0)
Hemoglobin: 12.6 g/dL (ref 12.0–15.0)
MCH: 29.4 pg (ref 26.0–34.0)
MCHC: 34.6 g/dL (ref 30.0–36.0)
MCV: 84.8 fL (ref 78.0–100.0)
PLATELETS: 191 10*3/uL (ref 150–400)
RBC: 4.29 MIL/uL (ref 3.87–5.11)
RDW: 14 % (ref 11.5–15.5)
WBC: 8.2 10*3/uL (ref 4.0–10.5)

## 2016-12-19 LAB — TYPE AND SCREEN
ABO/RH(D): AB POS
Antibody Screen: NEGATIVE

## 2016-12-19 NOTE — Patient Instructions (Signed)
Instrucciones Para Antes de la Ciruga   Su ciruga est programada para 12/20/2016  (your procedure is scheduled on) Entre por la entrada principal del [facility]  a las [address] de la [place] -(enter through the main entrance at [facility] at  [facility]    Levante el telfono, [place] el [phone] para informarnos de su llegada. (pick up phone, dial [phone] on arrival)     Por favor llame al [phone] si tiene algn problema en la maana de la ciruga (please call  if you have any problems the morning of surgery.)                  Recuerde: (Remember)  No coma alimentos.Despues a la media (Do not eat food )    No tome lquidos claros.Despues a la media (Do not drink clear liquids )    No use joyas, maquillaje de ojos, lpiz labial, crema para el cuerpo o esmalte de uas oscuro. (Do not wear jewelry, eye makeup, lipstick, body lotion, or dark fingernail polish). Puede usar desodorante (you may wear deodorant)    No se afeite 48 horas de su ciruga. (Do not shave 48 hours before your surgery)    No traiga objetos de valor al hospital.  [place] no se hace responsable de ninguna pertenencia, ni objetos de valor que haya trado al hospital. (Do not bring valuable to the hospital.  [facility] is not responsible for any belongings or valuables brought to the hospital)   [facility] estas medicinas en la maana de la ciruga con un SORBITO de agua nada (take these meds the morning of surgery with a SIP of water)     Durante la ciruga no se pueden usar lentes de contacto, dentaduras o puentes. (Contacts, dentures or bridgework cannot be worn in surgery).   Si va a ser ingresado despus de la ciruga, deje la AMR Corporationmaleta en el carro hasta que se le haya asignado una habitacin. (If you are to be admitted after surgery, leave suitcase in car until your room has been assigned.)   A los pacientes que se les d de alta el mismo da no se les permitir manejar a casa.   (Patients discharged on the day of surgery will not be allowed to drive home)    [place] y nmero de telfono del Engineer, productionconductor nada. (Name and telephone number of your driver)   Instrucciones especiales N/A (Special Instructions)   Por favor, lea las hojas informativas que le entregaron. (Please read over the following fact sheets that you were given) Surgical Site Infection Prevention

## 2016-12-20 ENCOUNTER — Encounter (HOSPITAL_COMMUNITY): Payer: Self-pay | Admitting: Anesthesiology

## 2016-12-20 ENCOUNTER — Inpatient Hospital Stay (HOSPITAL_COMMUNITY): Payer: Medicaid Other | Admitting: Anesthesiology

## 2016-12-20 ENCOUNTER — Encounter (HOSPITAL_COMMUNITY)
Admission: RE | Disposition: A | Discharge status: Home / Self Care | Payer: Self-pay | Source: Ambulatory Visit | Attending: Obstetrics & Gynecology

## 2016-12-20 ENCOUNTER — Inpatient Hospital Stay (HOSPITAL_COMMUNITY)
Admission: RE | Admit: 2016-12-20 | Discharge: 2016-12-22 | DRG: 765 | Disposition: A | Discharge status: Home / Self Care | Payer: Medicaid Other | Source: Ambulatory Visit | Attending: Obstetrics & Gynecology | Admitting: Obstetrics & Gynecology

## 2016-12-20 DIAGNOSIS — Z87891 Personal history of nicotine dependence: Secondary | ICD-10-CM | POA: Diagnosis not present

## 2016-12-20 DIAGNOSIS — O34211 Maternal care for low transverse scar from previous cesarean delivery: Secondary | ICD-10-CM | POA: Diagnosis present

## 2016-12-20 DIAGNOSIS — O0990 Supervision of high risk pregnancy, unspecified, unspecified trimester: Secondary | ICD-10-CM

## 2016-12-20 DIAGNOSIS — O3433 Maternal care for cervical incompetence, third trimester: Secondary | ICD-10-CM | POA: Diagnosis present

## 2016-12-20 DIAGNOSIS — O09299 Supervision of pregnancy with other poor reproductive or obstetric history, unspecified trimester: Secondary | ICD-10-CM

## 2016-12-20 DIAGNOSIS — Z3A39 39 weeks gestation of pregnancy: Secondary | ICD-10-CM

## 2016-12-20 DIAGNOSIS — O34219 Maternal care for unspecified type scar from previous cesarean delivery: Secondary | ICD-10-CM

## 2016-12-20 DIAGNOSIS — Z98891 History of uterine scar from previous surgery: Secondary | ICD-10-CM

## 2016-12-20 LAB — RPR: RPR: NONREACTIVE

## 2016-12-20 SURGERY — Surgical Case
Anesthesia: Spinal

## 2016-12-20 MED ORDER — ONDANSETRON HCL 4 MG/2ML IJ SOLN
INTRAMUSCULAR | Status: AC
  Filled 2016-12-20: qty 2

## 2016-12-20 MED ORDER — KETOROLAC TROMETHAMINE 30 MG/ML IJ SOLN
30.0000 mg | Freq: Four times a day (QID) | INTRAMUSCULAR | Status: AC | PRN
  Administered 2016-12-20: 30 mg via INTRAMUSCULAR

## 2016-12-20 MED ORDER — FERROUS SULFATE 325 (65 FE) MG PO TABS
325.0000 mg | ORAL_TABLET | Freq: Two times a day (BID) | ORAL | Status: DC
  Administered 2016-12-20 – 2016-12-22 (×3): 325 mg via ORAL
  Filled 2016-12-20 (×3): qty 1

## 2016-12-20 MED ORDER — NALBUPHINE SYRINGE 5 MG/0.5 ML
5.0000 mg | INJECTION | INTRAMUSCULAR | Status: DC | PRN
  Filled 2016-12-20: qty 0.5

## 2016-12-20 MED ORDER — NALOXONE HCL 2 MG/2ML IJ SOSY
1.0000 ug/kg/h | PREFILLED_SYRINGE | INTRAVENOUS | Status: DC | PRN
  Filled 2016-12-20: qty 2

## 2016-12-20 MED ORDER — SOD CITRATE-CITRIC ACID 500-334 MG/5ML PO SOLN
30.0000 mL | Freq: Once | ORAL | Status: AC
  Administered 2016-12-20: 30 mL via ORAL
  Filled 2016-12-20: qty 15

## 2016-12-20 MED ORDER — OXYTOCIN 10 UNIT/ML IJ SOLN
INTRAVENOUS | Status: DC | PRN
  Administered 2016-12-20: 40 [IU] via INTRAVENOUS

## 2016-12-20 MED ORDER — PRENATAL MULTIVITAMIN CH
1.0000 | ORAL_TABLET | Freq: Every day | ORAL | Status: DC
  Administered 2016-12-21 – 2016-12-22 (×2): 1 via ORAL
  Filled 2016-12-20 (×2): qty 1

## 2016-12-20 MED ORDER — SIMETHICONE 80 MG PO CHEW
80.0000 mg | CHEWABLE_TABLET | ORAL | Status: DC
  Administered 2016-12-22: 80 mg via ORAL
  Filled 2016-12-20 (×2): qty 1

## 2016-12-20 MED ORDER — MORPHINE SULFATE (PF) 0.5 MG/ML IJ SOLN
INTRAMUSCULAR | Status: AC
  Filled 2016-12-20: qty 10

## 2016-12-20 MED ORDER — SCOPOLAMINE 1 MG/3DAYS TD PT72
1.0000 | MEDICATED_PATCH | Freq: Once | TRANSDERMAL | Status: DC
  Administered 2016-12-20: 1.5 mg via TRANSDERMAL

## 2016-12-20 MED ORDER — LACTATED RINGERS IV SOLN
INTRAVENOUS | Status: DC
  Administered 2016-12-20 (×4): via INTRAVENOUS

## 2016-12-20 MED ORDER — NALBUPHINE SYRINGE 5 MG/0.5 ML
5.0000 mg | INJECTION | Freq: Once | INTRAMUSCULAR | Status: DC | PRN
  Filled 2016-12-20: qty 0.5

## 2016-12-20 MED ORDER — DIPHENHYDRAMINE HCL 25 MG PO CAPS: 25.0000 mg | ORAL_CAPSULE | Freq: Four times a day (QID) | ORAL | Status: DC | PRN

## 2016-12-20 MED ORDER — MEASLES, MUMPS & RUBELLA VAC ~~LOC~~ INJ: 0.5000 mL | INJECTION | Freq: Once | SUBCUTANEOUS | Status: DC

## 2016-12-20 MED ORDER — LACTATED RINGERS IV SOLN: INTRAVENOUS | Status: DC

## 2016-12-20 MED ORDER — ZOLPIDEM TARTRATE 5 MG PO TABS: 5.0000 mg | ORAL_TABLET | Freq: Every evening | ORAL | Status: DC | PRN

## 2016-12-20 MED ORDER — BUPIVACAINE IN DEXTROSE 0.75-8.25 % IT SOLN
INTRATHECAL | Status: AC
  Filled 2016-12-20: qty 2

## 2016-12-20 MED ORDER — PHENYLEPHRINE 8 MG IN D5W 100 ML (0.08MG/ML) PREMIX OPTIME
INJECTION | INTRAVENOUS | Status: DC | PRN
  Administered 2016-12-20: 60 ug/min via INTRAVENOUS

## 2016-12-20 MED ORDER — OXYCODONE-ACETAMINOPHEN 5-325 MG PO TABS: 2.0000 | ORAL_TABLET | ORAL | Status: DC | PRN

## 2016-12-20 MED ORDER — DIPHENHYDRAMINE HCL 25 MG PO CAPS
25.0000 mg | ORAL_CAPSULE | ORAL | Status: DC | PRN
  Administered 2016-12-20: 25 mg via ORAL
  Filled 2016-12-20: qty 1

## 2016-12-20 MED ORDER — OXYCODONE-ACETAMINOPHEN 5-325 MG PO TABS: 1.0000 | ORAL_TABLET | ORAL | Status: DC | PRN

## 2016-12-20 MED ORDER — ONDANSETRON HCL 4 MG/2ML IJ SOLN
INTRAMUSCULAR | Status: DC | PRN
  Administered 2016-12-20: 4 mg via INTRAVENOUS

## 2016-12-20 MED ORDER — PHENYLEPHRINE 40 MCG/ML (10ML) SYRINGE FOR IV PUSH (FOR BLOOD PRESSURE SUPPORT): PREFILLED_SYRINGE | INTRAVENOUS | Status: DC | PRN

## 2016-12-20 MED ORDER — WITCH HAZEL-GLYCERIN EX PADS: 1.0000 "application " | MEDICATED_PAD | CUTANEOUS | Status: DC | PRN

## 2016-12-20 MED ORDER — ACETAMINOPHEN 325 MG PO TABS: 650.0000 mg | ORAL_TABLET | ORAL | Status: DC | PRN

## 2016-12-20 MED ORDER — SIMETHICONE 80 MG PO CHEW
80.0000 mg | CHEWABLE_TABLET | ORAL | Status: DC | PRN
  Administered 2016-12-21 – 2016-12-22 (×2): 80 mg via ORAL

## 2016-12-20 MED ORDER — SENNOSIDES-DOCUSATE SODIUM 8.6-50 MG PO TABS
2.0000 | ORAL_TABLET | ORAL | Status: DC
  Administered 2016-12-22: 2 via ORAL
  Filled 2016-12-20 (×2): qty 2

## 2016-12-20 MED ORDER — IBUPROFEN 600 MG PO TABS
600.0000 mg | ORAL_TABLET | Freq: Four times a day (QID) | ORAL | Status: DC
  Administered 2016-12-20 – 2016-12-22 (×6): 600 mg via ORAL
  Filled 2016-12-20 (×7): qty 1

## 2016-12-20 MED ORDER — DIBUCAINE 1 % RE OINT: 1.0000 "application " | TOPICAL_OINTMENT | RECTAL | Status: DC | PRN

## 2016-12-20 MED ORDER — OXYTOCIN 10 UNIT/ML IJ SOLN
INTRAMUSCULAR | Status: AC
  Filled 2016-12-20: qty 4

## 2016-12-20 MED ORDER — LACTATED RINGERS IV SOLN
INTRAVENOUS | Status: DC | PRN
  Administered 2016-12-20: 10:00:00 via INTRAVENOUS

## 2016-12-20 MED ORDER — MAGNESIUM HYDROXIDE 400 MG/5ML PO SUSP: 30.0000 mL | ORAL | Status: DC | PRN

## 2016-12-20 MED ORDER — CEFAZOLIN SODIUM-DEXTROSE 2-4 GM/100ML-% IV SOLN
2.0000 g | INTRAVENOUS | Status: AC
  Administered 2016-12-20: 2 g via INTRAVENOUS
  Filled 2016-12-20: qty 100

## 2016-12-20 MED ORDER — NALOXONE HCL 0.4 MG/ML IJ SOLN: 0.4000 mg | INTRAMUSCULAR | Status: DC | PRN

## 2016-12-20 MED ORDER — PHENYLEPHRINE 8 MG IN D5W 100 ML (0.08MG/ML) PREMIX OPTIME
INJECTION | INTRAVENOUS | Status: AC
  Filled 2016-12-20: qty 100

## 2016-12-20 MED ORDER — DIPHENHYDRAMINE HCL 50 MG/ML IJ SOLN: 12.5000 mg | INTRAMUSCULAR | Status: DC | PRN

## 2016-12-20 MED ORDER — KETOROLAC TROMETHAMINE 30 MG/ML IJ SOLN
INTRAMUSCULAR | Status: AC
  Filled 2016-12-20: qty 1

## 2016-12-20 MED ORDER — FENTANYL CITRATE (PF) 100 MCG/2ML IJ SOLN
INTRAMUSCULAR | Status: AC
  Filled 2016-12-20: qty 2

## 2016-12-20 MED ORDER — SODIUM CHLORIDE 0.9% FLUSH: 3.0000 mL | INTRAVENOUS | Status: DC | PRN

## 2016-12-20 MED ORDER — COCONUT OIL OIL: 1.0000 "application " | TOPICAL_OIL | Status: DC | PRN

## 2016-12-20 MED ORDER — PROMETHAZINE HCL 25 MG/ML IJ SOLN: 6.2500 mg | INTRAMUSCULAR | Status: DC | PRN

## 2016-12-20 MED ORDER — BUPIVACAINE HCL (PF) 0.5 % IJ SOLN
INTRAMUSCULAR | Status: AC
  Filled 2016-12-20: qty 30

## 2016-12-20 MED ORDER — TETANUS-DIPHTH-ACELL PERTUSSIS 5-2.5-18.5 LF-MCG/0.5 IM SUSP: 0.5000 mL | Freq: Once | INTRAMUSCULAR | Status: DC

## 2016-12-20 MED ORDER — ONDANSETRON HCL 4 MG/2ML IJ SOLN: 4.0000 mg | Freq: Three times a day (TID) | INTRAMUSCULAR | Status: DC | PRN

## 2016-12-20 MED ORDER — FENTANYL CITRATE (PF) 100 MCG/2ML IJ SOLN
INTRAMUSCULAR | Status: DC | PRN
  Administered 2016-12-20: 10 ug via INTRATHECAL

## 2016-12-20 MED ORDER — MORPHINE SULFATE (PF) 0.5 MG/ML IJ SOLN
INTRAMUSCULAR | Status: DC | PRN
  Administered 2016-12-20: .2 mg via INTRATHECAL

## 2016-12-20 MED ORDER — BUPIVACAINE IN DEXTROSE 0.75-8.25 % IT SOLN
INTRATHECAL | Status: DC | PRN
  Administered 2016-12-20: 12 mg via INTRATHECAL

## 2016-12-20 MED ORDER — KETOROLAC TROMETHAMINE 30 MG/ML IJ SOLN: 30.0000 mg | Freq: Four times a day (QID) | INTRAMUSCULAR | Status: AC | PRN

## 2016-12-20 MED ORDER — EPINEPHRINE PF 1 MG/10ML IJ SOSY
PREFILLED_SYRINGE | INTRAMUSCULAR | Status: AC
  Filled 2016-12-20: qty 10

## 2016-12-20 MED ORDER — MEPERIDINE HCL 25 MG/ML IJ SOLN: 6.2500 mg | INTRAMUSCULAR | Status: DC | PRN

## 2016-12-20 MED ORDER — SCOPOLAMINE 1 MG/3DAYS TD PT72
MEDICATED_PATCH | TRANSDERMAL | Status: AC
  Filled 2016-12-20: qty 1

## 2016-12-20 MED ORDER — FENTANYL CITRATE (PF) 100 MCG/2ML IJ SOLN: 25.0000 ug | INTRAMUSCULAR | Status: DC | PRN

## 2016-12-20 MED ORDER — MENTHOL 3 MG MT LOZG: 1.0000 | LOZENGE | OROMUCOSAL | Status: DC | PRN

## 2016-12-20 MED ORDER — OXYTOCIN 40 UNITS IN LACTATED RINGERS INFUSION - SIMPLE MED: 2.5000 [IU]/h | INTRAVENOUS | Status: AC

## 2016-12-20 SURGICAL SUPPLY — 34 items
BENZOIN TINCTURE PRP APPL 2/3 (GAUZE/BANDAGES/DRESSINGS) ×3 IMPLANT
CHLORAPREP W/TINT 26ML (MISCELLANEOUS) ×3 IMPLANT
CLAMP CORD UMBIL (MISCELLANEOUS) IMPLANT
CLOSURE STERI STRIP 1/2 X4 (GAUZE/BANDAGES/DRESSINGS) ×3 IMPLANT
CLOTH BEACON ORANGE TIMEOUT ST (SAFETY) ×3 IMPLANT
DECANTER SPIKE VIAL GLASS SM (MISCELLANEOUS) ×3 IMPLANT
DRSG OPSITE POSTOP 4X10 (GAUZE/BANDAGES/DRESSINGS) ×3 IMPLANT
ELECT REM PT RETURN 9FT ADLT (ELECTROSURGICAL) ×3
ELECTRODE REM PT RTRN 9FT ADLT (ELECTROSURGICAL) ×1 IMPLANT
EXTRACTOR VACUUM M CUP 4 TUBE (SUCTIONS) IMPLANT
EXTRACTOR VACUUM M CUP 4' TUBE (SUCTIONS)
GLOVE BIOGEL PI IND STRL 7.0 (GLOVE) ×3 IMPLANT
GLOVE BIOGEL PI INDICATOR 7.0 (GLOVE) ×6
GLOVE ECLIPSE 7.0 STRL STRAW (GLOVE) ×3 IMPLANT
GOWN STRL REUS W/TWL LRG LVL3 (GOWN DISPOSABLE) ×6 IMPLANT
KIT ABG SYR 3ML LUER SLIP (SYRINGE) IMPLANT
NEEDLE HYPO 22GX1.5 SAFETY (NEEDLE) ×3 IMPLANT
NEEDLE HYPO 25X5/8 SAFETYGLIDE (NEEDLE) ×3 IMPLANT
NS IRRIG 1000ML POUR BTL (IV SOLUTION) ×3 IMPLANT
PACK C SECTION WH (CUSTOM PROCEDURE TRAY) ×3 IMPLANT
PAD ABD 7.5X8 STRL (GAUZE/BANDAGES/DRESSINGS) ×6 IMPLANT
PAD OB MATERNITY 4.3X12.25 (PERSONAL CARE ITEMS) ×3 IMPLANT
PENCIL SMOKE EVAC W/HOLSTER (ELECTROSURGICAL) ×3 IMPLANT
RETRACTOR TRAXI PANNICULUS (MISCELLANEOUS) ×1 IMPLANT
RTRCTR C-SECT PINK 25CM LRG (MISCELLANEOUS) IMPLANT
SUT PDS AB 0 CTX 36 PDP370T (SUTURE) IMPLANT
SUT PLAIN 2 0 XLH (SUTURE) IMPLANT
SUT VIC AB 0 CTX 36 (SUTURE) ×4
SUT VIC AB 0 CTX36XBRD ANBCTRL (SUTURE) ×2 IMPLANT
SUT VIC AB 4-0 KS 27 (SUTURE) ×3 IMPLANT
SYR CONTROL 10ML LL (SYRINGE) ×3 IMPLANT
TOWEL OR 17X24 6PK STRL BLUE (TOWEL DISPOSABLE) ×3 IMPLANT
TRAXI PANNICULUS RETRACTOR (MISCELLANEOUS) ×2
TRAY FOLEY BAG SILVER LF 14FR (SET/KITS/TRAYS/PACK) ×3 IMPLANT

## 2016-12-20 NOTE — Transfer of Care (Signed)
Immediate Anesthesia Transfer of Care Note  Patient: Lindsey MccoyNallely I Wells  Procedure(s) Performed: Procedure(s): REPEAT CESAREAN SECTION (N/A)  Patient Location: PACU  Anesthesia Type:Spinal  Level of Consciousness: awake  Airway & Oxygen Therapy: Patient Spontanous Breathing  Post-op Assessment: Report given to RN  Post vital signs: Reviewed and stable  Last Vitals:  Vitals:   12/20/16 0755  BP: 114/67  Pulse: 84  Resp: 20  Temp: 37.1 C    Last Pain:  Vitals:   12/20/16 0755  TempSrc: Oral      Patients Stated Pain Goal: 4 (12/20/16 0755)  Complications: No apparent anesthesia complications

## 2016-12-20 NOTE — H&P (Signed)
Obstetric Preoperative History and Physical  Lindsey Wells is a 36 y.o. G3P1101 with IUP at 10412w0d presenting for scheduled repeat cesarean section. Patient is Spanish-speaking only, Spanish interpreter present for this encounter.  No acute concerns.   Prenatal Course Pregnancy complications or risks: Patient Active Problem List   Diagnosis Date Noted  . Language barrier 11/06/2016  . Supervision of high-risk pregnancy 08/23/2016  . History of incompetent cervix, currently pregnant 08/23/2016  . History of premature rupture of membranes in previous pregnancy, currently pregnant 08/23/2016  . Previous cesarean delivery affecting pregnancy, antepartum 08/23/2016    Clinic [facility] Prenatal Labs  Dating LMP Blood type: AB/Positive/-- (03/08 0000)   Genetic Screen Quad: Neg Antibody:Negative (03/08 0000)  Anatomic US [facility] with cervical length 3.1 Rubella: Immune (03/08 0000)  GTT Early: Nml              Third trimester: 78, 135, 98 RPR: Nonreactive (03/08 0000)   Flu vaccine 08/03/16 HBsAg:   Negative  TDaP vaccine  10/05/16                                         HIV:   Negative  Baby Food Breast                                              GBS: Neg  Contraception POPs Pap: Nml 2017 [facility]  Circumcision Unsure    Pediatrician undecided   Support Person Rogelio (FOB)     Past Medical History:  Diagnosis Date  . Medical history non-contributory     Past Surgical History:  Procedure Laterality Date  . CESAREAN SECTION N/A 01/01/2016   Procedure: CESAREAN SECTION;  Surgeon: Reva Boresanya S Pratt, MD;  [facility];  Service: Obstetrics;  Laterality: N/A;    OB History  Gravida Para Term Preterm AB Living  3 2 1 1   1   SAB TAB Ectopic Multiple Live Births  0 0 0 0 2    # Outcome Date GA Lbr Len/2nd Weight Sex Delivery Anes PTL Lv  3 Current           2 Preterm 01/01/16 3670w6d  1 lb 9 oz (0.71 kg) M CS-LTranv Spinal  ND     Complications: Preterm premature  rupture of membranes,Chorioamnionitis,Incompetence of cervix  1 Term 08/12/03 3649w1d  7 lb 15 oz (3.6 kg) F Vag-Spont EPI N LIV      Social History   Social History  . Marital status: Single    Spouse name: N/A  . Number of children: N/A  . Years of education: N/A   Social History Main Topics  . Smoking status: Former Games developermoker  . Smokeless tobacco: Never Used  . Alcohol use No  . Drug use: No  . Sexual activity: Yes   Other Topics Concern  . None   Social History Narrative  . None    Family History  Problem Relation Age of Onset  . Diabetes Father   . Hypertension Father     Prescriptions Prior to Admission  Medication Sig Dispense Refill Last Dose  . Prenatal Vit-Fe Fumarate-FA (MULTIVITAMIN-PRENATAL) 27-0.8 MG TABS tablet Take 1 tablet by mouth daily at 12 noon. 30 each 10 Taking  . ranitidine (ZANTAC) 75 MG tablet  Take 75 mg by mouth daily as needed for heartburn.       No Known Allergies  Review of Systems: Negative except for what is mentioned in HPI.  Physical Exam: BP 114/67 (BP Location: Right Arm)   Pulse 84   Temp 98.7 F (37.1 C) (Oral)   Resp 20   Ht 5\' 4"  (1.626 m)   Wt 210 lb (95.3 kg)   LMP 03/22/2016 (Exact Date)   BMI 36.05 kg/m  FHR by Doppler: 125 bpm CONSTITUTIONAL: Well-developed, well-nourished female in no acute distress.  HENT:  Normocephalic, atraumatic, External right and left ear normal. Oropharynx is clear and moist EYES: Conjunctivae and EOM are normal. Pupils are equal, round, and reactive to light. No scleral icterus.  NECK: Normal range of motion, supple, no masses SKIN: Skin is warm and dry. No rash noted. Not diaphoretic. No erythema. No pallor. NEUROLGIC: Alert and oriented to person, place, and time. Normal reflexes, muscle tone coordination. No cranial nerve deficit noted. PSYCHIATRIC: Normal mood and affect. Normal behavior. Normal judgment and thought content. CARDIOVASCULAR: Normal heart rate noted, regular  rhythm RESPIRATORY: Effort and breath sounds normal, no problems with respiration noted ABDOMEN: Soft, nontender, nondistended, gravid. Well-healed Pfannenstiel incision. PELVIC: Deferred MUSCULOSKELETAL: Normal range of motion. No edema and no tenderness. 2+ distal pulses.   Pertinent Labs/Studies:   Results for orders placed or performed during the hospital encounter of 12/19/16 (from the past 72 hour(s))  CBC     Status: None   Collection Time: 12/19/16 10:48 AM  Result Value Ref Range   WBC 8.2 4.0 - 10.5 K/uL   RBC 4.29 3.87 - 5.11 MIL/uL   Hemoglobin 12.6 12.0 - 15.0 g/dL   HCT 16.136.4 09.636.0 - 04.546.0 %   MCV 84.8 78.0 - 100.0 fL   MCH 29.4 26.0 - 34.0 pg   MCHC 34.6 30.0 - 36.0 g/dL   RDW 40.914.0 81.111.5 - 91.415.5 %   Platelets 191 150 - 400 K/uL  RPR     Status: None   Collection Time: 12/19/16 10:48 AM  Result Value Ref Range   RPR Ser Ql Non Reactive Non Reactive    Comment: (NOTE) Performed At: [facility] [address] [place], [place] [id number] Mila HomerHancock William F MD YQ:[id number]:[phone]   Type and screen [facility]     Status: None   Collection Time: 12/19/16 10:48 AM  Result Value Ref Range   ABO/RH(D) AB POS    Antibody Screen NEG    Sample Expiration 12/22/2016     Assessment and Plan :Lindsey Wells is a 36 y.o. G3P1101 at 3765w0d being admitted for scheduled repeat cesarean section. The risks of cesarean section discussed with the patient included but were not limited to: bleeding which may require transfusion or reoperation; infection which may require antibiotics; injury to bowel, bladder, ureters or other surrounding organs; injury to the fetus; need for additional procedures including hysterectomy in the event of a life-threatening hemorrhage; placental abnormalities wth subsequent pregnancies, incisional problems, thromboembolic phenomenon and other postoperative/anesthesia complications. The patient concurred with the proposed plan, giving  informed written consent for the procedure. Patient has been NPO since last night she will remain NPO for procedure. Anesthesia and OR aware. Preoperative prophylactic antibiotics and SCDs ordered on call to the OR. To OR when ready.    Jaynie CollinsUGONNA ANYANWU, MD, FACOG  Attending Obstetrician & Gynecologist  Faculty Practice, [facility]

## 2016-12-20 NOTE — Anesthesia Postprocedure Evaluation (Signed)
Anesthesia Post Note  Patient: Lindsey Wells  Procedure(s) Performed: Procedure(s) (LRB): REPEAT CESAREAN SECTION (N/A)     Patient location during evaluation: PACU Anesthesia Type: Spinal Level of consciousness: oriented and awake and alert Pain management: pain level controlled Vital Signs Assessment: post-procedure vital signs reviewed and stable Respiratory status: spontaneous breathing, respiratory function stable and patient connected to nasal cannula oxygen Cardiovascular status: blood pressure returned to baseline and stable Postop Assessment: no headache, no backache, spinal receding and patient able to bend at knees Anesthetic complications: no    Last Vitals:  Vitals:   12/20/16 1130 12/20/16 1145  BP: 115/65 104/68  Pulse: (!) 57 (!) 59  Resp: 20 18  Temp:  36.9 C    Last Pain:  Vitals:   12/20/16 1145  TempSrc: Oral   Pain Goal: Patients Stated Pain Goal: 0 (12/20/16 1152)               Shelton SilvasKevin D Tonnette Zwiebel

## 2016-12-20 NOTE — Op Note (Signed)
Lindsey Wells PROCEDURE DATE: 12/20/2016  PREOPERATIVE DIAGNOSES: Intrauterine pregnancy at 4019w0d weeks gestation; previous cesarean section x 1  POSTOPERATIVE DIAGNOSES: The same  PROCEDURE: Repeat Low Transverse Cesarean Section  SURGEON:  Dr. Jaynie CollinsUgonna Anyanwu  ASSISTANT:  Dr. Caryl AdaJazma Phelps  ANESTHESIOLOGY TEAM: Anesthesiologist: Shelton SilvasHollis, Kevin D, MD  CRNA: Algis GreenhouseBurger, [name] A, CRNA  INDICATIONS: Lindsey Wells is a 36 y.o. [id number] at 9119w0d here for cesarean section secondary to the indications listed under preoperative diagnoses; please see preoperative note for further details.  The risks of cesarean section were discussed with the patient including but were not limited to: bleeding which may require transfusion or reoperation; infection which may require antibiotics; injury to bowel, bladder, ureters or other surrounding organs; injury to the fetus; need for additional procedures including hysterectomy in the event of a life-threatening hemorrhage; placental abnormalities wth subsequent pregnancies, incisional problems, thromboembolic phenomenon and other postoperative/anesthesia complications.   The patient concurred with the proposed plan, giving informed written consent for the procedure.    FINDINGS:  Viable female infant in cephalic presentation.  Apgars 8 and 9.  Clear amniotic fluid.  Intact placenta, three vessel cord.  Normal uterus, fallopian tubes and ovaries bilaterally. Small adhesion of omentum to anterior abdominal wall, lysed with electrocautery.  Minimal other intraperitoneal adhesive disease.  ANESTHESIA: Spinal INTRAVENOUS FLUIDS:3100 ml   ESTIMATED BLOOD LOSS: 800 ml URINE OUTPUT:  125 ml SPECIMENS: Placenta sent to L&D COMPLICATIONS: None immediate  PROCEDURE IN DETAIL:  The patient preoperatively received intravenous antibiotics and had sequential compression devices applied to her lower extremities.  She was then taken to the operating room where  spinal anesthesia was administered and was found to be adequate. She was then placed in a dorsal supine position with a leftward tilt, and prepped and draped in a sterile manner.  A foley catheter was placed into her bladder and attached to constant gravity.  After an adequate timeout was performed, a Pfannenstiel skin incision was made with scalpel over her preexisting scar and carried through to the underlying layer of fascia. The fascia was incised in the midline, and this incision was extended bilaterally using the Mayo scissors.  Kocher clamps were applied to the superior aspect of the fascial incision and the underlying rectus muscles were dissected off bluntly.  A similar process was carried out on the inferior aspect of the fascial incision. The rectus muscles were separated in the midline bluntly and the peritoneum was entered bluntly. Attention was turned to the lower uterine segment where a low transverse hysterotomy was made with a scalpel and extended bilaterally bluntly.  The infant was successfully delivered, the cord was clamped and cut after one minute, and the infant was handed over to the awaiting neonatology team. Uterine massage was then administered, and the placenta delivered intact with a three-vessel cord. The uterus was then cleared of clots and debris.  The hysterotomy was closed with 0 Vicryl in a running locked fashion, and an imbricating layer was also placed with 0 Vicryl.  Figure-of-eight 0 Vicryl serosal stitches were placed to help with hemostasis.  The pelvis was cleared of all clot and debris. Hemostasis was confirmed on all surfaces.  The peritoneum and the rectus muscles were reapproximated using 0 Vicryl interrupted stitches. The fascia was then closed using 0 PDS in a running fashion.  The subcutaneous layer was irrigated, then reapproximated with 2-0 plain gut interrupted stitches, and 30 ml of 0.5% Marcaine was injected subcutaneously around the incision.  The  skin was  closed with a 4-0 Vicryl subcuticular stitch. The patient tolerated the procedure well. Sponge, lap, instrument and needle counts were correct x 3.  She was taken to the recovery room in stable condition.    UGONNA  ANYANWU, MD, FACOG Attending ObstetJaynie Collinsrician & Gynecologist Faculty Practice, [facility] [place]

## 2016-12-20 NOTE — Lactation Note (Signed)
This note was copied from a baby's chart. Lactation Consultation Note  Patient Name: Boy Dwan Boltallely Garcia-Diosdado NWGNF'AToday's Date: 12/20/2016 Reason for consult: Initial assessment Baby at 5 hr of life. Mom desires to offer breast but will give formula when she returns to work. She bf her older child for 8357m with "no problems". She would like to start pumping. Explained that she will most likely not have enough milk to pump and feed at this time. Pumping is about extra stimulation to help bring her milk to volume more quickly. Showed parents how to spoon feed. Mom has small, wide spaced breast, with quarter sized areolas, and long firm nipples. Mom can easily express large drops of colostrum. Baby has a nice gape but does not flange lips. Showed mom how to position the baby at the breast to get a deeper latch. Discussed baby behavior, feeding frequency, baby belly size, voids, wt loss, breast changes, and nipple care. Given lactation handouts. Aware of OP services and support group.     Maternal Data Has patient been taught Hand Expression?: Yes Does the patient have breastfeeding experience prior to this delivery?: Yes  Feeding Feeding Type: Breast Fed Length of feed: 15 min  LATCH Score/Interventions Latch: Grasps breast easily, tongue down, lips flanged, rhythmical sucking.  Audible Swallowing: A few with stimulation Intervention(s): Skin to skin;Hand expression  Type of Nipple: Everted at rest and after stimulation  Comfort (Breast/Nipple): Soft / non-tender     Hold (Positioning): Assistance needed to correctly position infant at breast and maintain latch. Intervention(s): Position options;Support Pillows  LATCH Score: 8  Lactation Tools Discussed/Used Pump Review: Setup, frequency, and cleaning Initiated by:: Marda StalkerKristen Lassiter Date initiated:: 12/20/16   Consult Status Consult Status: Follow-up Date: 12/21/16 Follow-up type: In-patient    Rulon Eisenmengerlizabeth E Malin Cervini 12/20/2016,  3:06 PM

## 2016-12-20 NOTE — Anesthesia Procedure Notes (Signed)
Spinal  Patient location during procedure: OR Start time: 12/20/2016 9:23 AM End time: 12/20/2016 9:26 AM Staffing Anesthesiologist: Suella Broad D Performed: anesthesiologist  Preanesthetic Checklist Completed: patient identified, site marked, surgical consent, pre-op evaluation, timeout performed, IV checked, risks and benefits discussed and monitors and equipment checked Spinal Block Patient position: sitting Prep: Betadine Patient monitoring: heart rate, continuous pulse ox, blood pressure and cardiac monitor Approach: midline Location: L4-5 Injection technique: single-shot Needle Needle type: Introducer and Pencan  Needle gauge: 24 G Needle length: 9 cm Additional Notes Negative paresthesia. Negative blood return. Positive free-flowing CSF. Expiration date of kit checked and confirmed. Patient tolerated procedure well, without complications.

## 2016-12-20 NOTE — Anesthesia Preprocedure Evaluation (Addendum)
Anesthesia Evaluation  Patient identified by MRN, date of birth, ID band Patient awake    Reviewed: Allergy & Precautions, NPO status , Patient's Chart, lab work & pertinent test results  Airway Mallampati: III  TM Distance: >3 FB Neck ROM: Full    Dental  (+) Teeth Intact, Dental Advisory Given   Pulmonary neg pulmonary ROS, former smoker,    breath sounds clear to auscultation       Cardiovascular negative cardio ROS   Rhythm:Regular Rate:Normal     Neuro/Psych negative neurological ROS  negative psych ROS   GI/Hepatic Neg liver ROS, GERD  Medicated,  Endo/Other  negative endocrine ROS  Renal/GU negative Renal ROS     Musculoskeletal negative musculoskeletal ROS (+)   Abdominal   Peds  Hematology negative hematology ROS (+)   Anesthesia Other Findings Day of surgery medications reviewed with the patient.  Reproductive/Obstetrics (+) Pregnancy                            Anesthesia Physical Anesthesia Plan  ASA: II  Anesthesia Plan: Spinal   Post-op Pain Management:    Induction:   PONV Risk Score and Plan:   Airway Management Planned: Natural Airway  Additional Equipment:   Intra-op Plan:   Post-operative Plan:   Informed Consent: I have reviewed the patients History and Physical, chart, labs and discussed the procedure including the risks, benefits and alternatives for the proposed anesthesia with the patient or authorized representative who has indicated his/her understanding and acceptance.     Plan Discussed with: CRNA  Anesthesia Plan Comments:         Anesthesia Quick Evaluation

## 2016-12-21 LAB — CBC
HCT: 31.2 % — ABNORMAL LOW (ref 36.0–46.0)
Hemoglobin: 10.9 g/dL — ABNORMAL LOW (ref 12.0–15.0)
MCH: 29.5 pg (ref 26.0–34.0)
MCHC: 34.9 g/dL (ref 30.0–36.0)
MCV: 84.3 fL (ref 78.0–100.0)
PLATELETS: 148 10*3/uL — AB (ref 150–400)
RBC: 3.7 MIL/uL — AB (ref 3.87–5.11)
RDW: 13.9 % (ref 11.5–15.5)
WBC: 8.8 10*3/uL (ref 4.0–10.5)

## 2016-12-21 LAB — BIRTH TISSUE RECOVERY COLLECTION (PLACENTA DONATION)

## 2016-12-21 NOTE — Progress Notes (Signed)
Check patient needs, ordered her meals, by Orlan LeavensViria Alvarez Spanish Interpreter.

## 2016-12-21 NOTE — Progress Notes (Signed)
Subjective: Postpartum Day 1: Cesarean Delivery 2/2 scheduled rLTCS. Patient is Spanish-speaking only, Spanish interpreter present for this encounter.Patient reports well controlled incisional pain, tolerating PO, + flatus and no problems voiding.  Patient denies headache, vision changes, chest pain, shortness of breath, or RUQ pain.   Patient breast feeding well throughout the night.  Objective: Vital signs in last 24 hours: Temp:  [97.5 F (36.4 C)-98.7 F (37.1 C)] 98.5 F (36.9 C) (07/26 0130) Pulse Rate:  [52-84] 58 (07/26 0130) Resp:  [15-20] 18 (07/26 0130) BP: (99-118)/(46-68) 101/48 (07/26 0130) SpO2:  [97 %-100 %] 97 % (07/26 0130) Weight:  [95.3 kg (210 lb)] 95.3 kg (210 lb) (07/25 0755)  Physical Exam:  General: alert, cooperative, appears stated age and no distress Lochia: appropriate Uterine Fundus: firm Incision: healing well, no dehiscence, no significant erythema, 1/3 of honeycomb bandage saturated with blood DVT Evaluation: No evidence of DVT seen on physical exam. Physical Exam  Cardiovascular: Normal rate and regular rhythm.   Grade 2/6 systolic murmur  Pulmonary/Chest: Effort normal and breath sounds normal. No respiratory distress. She has no wheezes. She has no rales.  Abdominal: Soft. Bowel sounds are normal. She exhibits no distension. There is tenderness. There is no rebound and no guarding.  Skin: Skin is warm and dry. Capillary refill takes less than 2 seconds.  Psychiatric: She has a normal mood and affect. Her behavior is normal.    Recent Labs  12/19/16 1048 12/21/16 0538  HGB 12.6 10.9*  HCT 36.4 31.2*    Assessment/Plan: Status post Cesarean section. Doing well postoperatively.  Continue current care. Patient instructed to ambulate early and often to speed recovery and avoid complications, but with help as needed.  Conard NovakJoshua A Jhase Creppel, MD 12/21/2016, 7:16 AM

## 2016-12-22 MED ORDER — IBUPROFEN 600 MG PO TABS
600.0000 mg | ORAL_TABLET | Freq: Four times a day (QID) | ORAL | 0 refills | Status: DC
Start: 2016-12-22 — End: 2018-02-13

## 2016-12-22 MED ORDER — OXYCODONE-ACETAMINOPHEN 5-325 MG PO TABS: 1.0000 | ORAL_TABLET | ORAL | 0 refills | Status: DC | PRN

## 2016-12-22 NOTE — Discharge Summary (Signed)
OB Discharge Summary     Patient Name: Lindsey Wells DOB: 28-Jun-1980 MRN: [id number]  Date of admission: 12/20/2016 Delivering MD: Lindsey Wells, Lindsey Wells   Date of discharge: 12/22/2016  Admitting diagnosis: RCS Intrauterine pregnancy: 638w0d     Secondary diagnosis:  Principal Problem:   S/P cesarean section Active Problems:   Supervision of high-risk pregnancy   History of incompetent cervix, currently pregnant   History of premature rupture of membranes in previous pregnancy, currently pregnant  Additional problems:  Patient Active Problem List   Diagnosis Date Noted  . Language barrier 11/06/2016  . Supervision of high-risk pregnancy 08/23/2016  . History of incompetent cervix, currently pregnant 08/23/2016  . History of premature rupture of membranes in previous pregnancy, currently pregnant 08/23/2016  . S/P cesarean section 08/23/2016        Discharge diagnosis: Term Pregnancy Delivered                                                                                                Post partum procedures:none  Augmentation: none  Complications: None  Hospital course:  Sceduled C/S   36 y.o. yo G3P2102 at 558w0d was admitted to the hospital 12/20/2016 for scheduled cesarean section with the following indication:Elective Repeat.  Membrane Rupture Time/Date: 9:53 AM ,12/20/2016   Patient delivered Wells Viable infant.12/20/2016  Details of operation can be found in separate operative note.  Pateint had an uncomplicated postpartum course.  She is ambulating, tolerating Wells regular diet, passing flatus, and urinating well. Patient is discharged home in stable condition on  12/22/16         Physical exam  Vitals:   12/21/16 0130 12/21/16 1030 12/21/16 1811 12/22/16 0956  BP: (!) 101/48 (!) 94/46 (!) 112/56 (!) 117/58  Pulse: (!) 58 60 (!) 54 (!) 53  Resp: 18 16 18 18   Temp: 98.5 F (36.9 C) 98 F (36.7 C) 97.8 F (36.6 C) 97.6 F (36.4 C)  TempSrc: Oral Oral Oral  Oral  SpO2: 97% 98%    Weight:      Height:       For Physical Exams Findings, please refer to the progress note from 12/22/16  Labs: Lab Results  Component Value Date   WBC 8.8 12/21/2016   HGB 10.9 (L) 12/21/2016   HCT 31.2 (L) 12/21/2016   MCV 84.3 12/21/2016   PLT 148 (L) 12/21/2016   CMP Latest Ref Rng & Units 08/29/2015  Glucose 65 - 99 mg/dL 91  BUN 6 - 20 mg/dL 12  Creatinine 4.090.44 - 8.111.00 mg/dL 9.140.66  Sodium 782135 - 956145 mmol/L 138  Potassium 3.5 - 5.1 mmol/L 3.7  Chloride 101 - 111 mmol/L 109  CO2 22 - 32 mmol/L 23  Calcium 8.9 - 10.3 mg/dL 2.1(H8.7(L)    Discharge instruction: per After Visit Summary and "Baby and Me Booklet".  After visit meds:  Allergies as of 12/22/2016   No Known Allergies     Medication List    TAKE these medications   ibuprofen 600 MG tablet Commonly known as:  ADVIL,MOTRIN Take 1 tablet (600 mg total) by mouth  every 6 (six) hours.   multivitamin-prenatal 27-0.8 MG Tabs tablet Take 1 tablet by mouth daily at 12 noon.   oxyCODONE-acetaminophen 5-325 MG tablet Commonly known as:  PERCOCET/ROXICET Take 1 tablet by mouth every 4 (four) hours as needed for severe pain.   ranitidine 75 MG tablet Commonly known as:  ZANTAC Take 75 mg by mouth daily as needed for heartburn.       Diet: routine diet  Activity: Advance as tolerated. Pelvic rest for 6 weeks.   Outpatient follow up:4-6 weeks Follow up Appt:No future appointments. Follow up Visit:No Follow-up on file.  Postpartum contraception: Progesterone only pills  Newborn Data: Live born female  Birth Weight: 7 lb 6.7 oz (3365 g) APGAR: 8, 9  Baby Feeding: Breast Disposition:home with mother   12/22/2016 Lindsey Shirley, DO  OB FELLOW DISCHARGE ATTESTATION  I have seen and examined this patient. I agree with above documentation and have made edits as needed.   Lindsey AdaJazma Chantele Corado, DO OB Fellow 7:22 PM

## 2016-12-22 NOTE — Progress Notes (Signed)
POSTPARTUM PROGRESS NOTE  Post OP day 2 Subjective:  Lindsey Wells is a 36 y.o. [id number] 4967w0d s/p C. Section.  No acute events overnight.  Pt denies problems with ambulating, voiding or po intake.  She denies nausea or vomiting.  Pain is well controlled.  She has had flatus. She has not had bowel movement.  Lochia Small.   Objective: Blood pressure (!) 112/56, pulse (!) 54, temperature 97.8 F (36.6 C), temperature source Oral, resp. rate 18, height 5\' 4"  (1.626 m), weight 95.3 kg (210 lb), last menstrual period 03/22/2016, SpO2 98 %, unknown if currently breastfeeding.  Physical Exam:  General: alert, cooperative and no distress Lochia:normal flow Chest: CTAB Heart: RR 2/6 systolic murmur heard in left second intercostal space  Abdomen: +BS, soft, nontender,  DVT Evaluation: No calf swelling or tenderness Extremities: trace edema Incision clean dry and in tact. Some dry blood on left aspect of dressing    Recent Labs  12/19/16 1048 12/21/16 0538  HGB 12.6 10.9*  HCT 36.4 31.2*    Assessment/Plan:  ASSESSMENT: Lindsey Wells is a 36 y.o. A5W0981G3P2102 3967w0d s/p c section   Discharge home   LOS: 2 days   Sullivan LoneBrannon L Inman, Medical Student   I confirm that I have verified the information documented in the resident's note and that I have also personally reperformed the physical exam and all medical decision making activities.

## 2016-12-22 NOTE — Progress Notes (Signed)
CSW received consult for concern for depressed affect and dynamic between FOB and MOB.  CSW met with MOB to offer support and complete assessment.   MOB was lying in bed awake next to infant sleeping in bassinet when CSW arrived.  MOB was pleasant and welcoming.  She was quiet throughout visit, but receptive to CSW's visit.  MOB presented with normal mood and affect and appropriately attended to baby when he cried.  She smiled when she spoke about him and stated that she feels "really good and happy" at this time.  She reports that her hospital stay has been positive and that delivery was quick.  She reports no emotional concerns at this time and denies any experience with PMADs after her first child was born 3 years ago.  She notes she has also experienced a miscarriage.  She states her daughter lives in [place] with her grandparents and that she has frequent contact.  MOB reports that she has been in the [place] a little over 2 years and wishes to get her daughter here with her as well.   CSW asked MOB about her supports.  She states her husband is her greatest support.  She reports that they have been together 2 years and that they have "a stable relationship."  She reports no relationship concerns.  She states he works and comes home daily around 5pm.   CSW provided education regarding the baby blues period vs. perinatal mood disorders and encouraged MOB to contact a medical professional if symptoms are noted at any time.   CSW provided review of Sudden Infant Death Syndrome (SIDS) precautions.  MOB reports she has a bassinet for baby and was aware of SIDS. CSW identifies no further need for intervention and no barriers to discharge at this time. CSW spoke with pediatrician regarding above.  

## 2016-12-22 NOTE — Lactation Note (Signed)
This note was copied from a baby's chart. Lactation Consultation Note  Patient Name: Boy Dwan Boltallely Garcia-Diosdado QIONG'EToday's Date: 12/22/2016  Mom c/o of sore nipples.  Small cracks noted at base.  Comfort gels given to mom.  Instructed on use of manual pump for discharge.   Maternal Data    Feeding Feeding Type: Breast Fed Length of feed: 30 min  LATCH Score/Interventions                      Lactation Tools Discussed/Used     Consult Status      Huston FoleyMOULDEN, Hezekiah Veltre S 12/22/2016, 1:21 PM

## 2016-12-22 NOTE — Discharge Instructions (Signed)
Instrucciones para la mamá sobre los cuidados en el hogar °(Home Care Instructions for Mom) °ACTIVIDAD °· Reanude sus actividades regulares de forma gradual. °· Descanse. Tome siestas cuando el bebé duerme. °· No levante objetos que pesen más de 10 libras (4,5 kg) hasta que el médico se lo autorice. °· Evite las actividades que demandan mucho esfuerzo y energía (que son extenuantes) hasta que el médico se lo autorice. Caminar a un ritmo tranquilo a moderado siempre es más seguro. °· Si tuvo un parto por cesárea: °? No pase la aspiradora, suba escaleras o conduzca un vehículo durante 4 o 6 semanas. °? Pídale a alguien que le brinde ayuda con las tareas domésticas hasta que pueda realizarlas por su cuenta. °? Haga ejercicios como se lo haya indicado el médico, si corresponde. ° °HEMORRAGIA VAGINAL °Probablemente continúe sangrando durante 4 o 6 semanas después del parto. Generalmente, la cantidad de sangre disminuye y el color se hace más claro con el transcurso del tiempo. Sin embargo, si usted está demasiado activa, el color de la sangre puede ser rojo brillante. Si necesita cambiarse la compresa higiénica en menos de una hora o tiene coágulos grandes: °· Permanezca acostada. °· Eleve los pies. °· Coloque compresas frías en la zona inferior del abdomen. °· Haga reposo. °· Comuníquese con su médico. °Si está amamantando, podría volver a tener su período entre las 8 semanas después del parto y el momento en que deje de amamantar. Si no está amamantando, volverá a tener su período 6 u 8 semanas después del parto. °CUIDADOS PERINEALES °La zona perineal o perineo, es la parte del cuerpo que se encuentra entre los muslos. Después del parto, esta zona necesita un cuidado especial. Siga las siguientes indicaciones como se lo haya indicado su médico. °· Tome baños de inmersión durante 15 o 20 minutos. °· Utilice apósitos o aerosoles analgésicos y cremas como se lo hayan indicado. °· No utilice tampones ni se haga duchas  vaginales hasta que el sangrado vaginal se haya detenido. °· Cada vez que vaya al baño: °? Use una botella perineal. °? Cámbiese el apósito. °? Use papel tisú en lugar de papel higiénico hasta que se cure la sutura. °· Haga ejercicios de Kegel todos los días. Los ejercicios Kegel ayudan a mantener los músculos que sostienen la vagina, la vejiga y los intestinos. Estos ejercicios se pueden realizar mientras está parada, sentada o acostada. Para hacer los ejercicios de Kegel: °? Tense los músculos del estómago y los que rodean el canal de parto. °? Mantenga esta posición durante unos segundos. °? Relájese. °? Repita hasta hacerlos 5 veces seguidas. °· Para evitar las hemorroides o que estas empeoren: °? Beba suficiente líquido para mantener la orina clara o de color amarillo pálido. °? Evite hacer fuerza al defecar. °? Tome los medicamentos y laxantes de venta libre como se lo haya indicado el médico. °CUIDADO DE LAS MAMAS °· Use un buen sostén. °· Evite tomar analgésicos de venta libre para las molestias de los pechos. °· Aplique hielo en los pechos para aliviar las molestias tanto como sea necesario: °? Ponga el hielo en una bolsa plástica. °? Coloque una toalla entre la piel y la bolsa de hielo. °? Aplique el hielo durante 20, o como se lo haya indicado el médico. ° °NUTRICIÓN °· Mantenga una dieta bien balanceada. °· No intente perder de peso rápidamente reduciendo el consumo de calorías. °· Tome sus vitaminas prenatales hasta el control de postparto o hasta que su médico se lo indique. ° °DEPRESIÓN POSTPARTO °  Puede sentir deseos de llorar sin motivo aparente y verse incapaz de enfrentarse a todos los cambios que implica tener un bebé. Este estado de ánimo se llama depresión postparto. La depresión postparto ocurre porque sus niveles hormonales sufren cambios después del parto. Si usted tiene depresión postparto, busque contención por parte de su pareja, sus amigos y su familia. Si la depresión no desaparece por  sí sola después de algunas semanas, concurra a su médico. °AUTOEXAMEN DE MAMAS °Realícese autoexámenes en el mismo momento cada mes. Si está amamantando, el mejor momento de controlar sus mamas es después de alimentar al bebé, cuando los pechos no están tan llenos. Si está amamantando y su período ya comenzó, controle sus mamas el día 5, 6 o 7 de su período. °Informe a su médico de cualquier protuberancia, bulto o secreción. Si está amamantando, las mamas normalmente tienen bultos. Esto es transitorio y no es un riesgo para la salud. °INTIMIDAD Y SEXUALIDAD °Debe evitar las relaciones sexuales durante al menos 3 o 4 semanas después del parto o hasta que el flujo de color rojo amarronado haya desaparecido completamente. Si no desea quedar embarazada nuevamente, use algún método anticonceptivo. Después del parto, puede quedar embarazada incluso si no ha tenido todavía el período. °SOLICITE ATENCIÓN MÉDICA SI: °· Se siente incapaz de controlar los cambios que implica tener un hijo y esos sentimientos no desaparecen después de algunas semanas. °· Detecta una protuberancia, bulto o secreción en sus mamas. ° °SOLICITE ATENCIÓN MÉDICA DE INMEDIATO SI: °· Debe cambiarse la compresa higiénica en 1 hora o menos. °· Tiene los siguientes síntomas: °? Dolor intenso o calambres en la parte inferior del abdomen. °? Una secreción vaginal con mal olor. °? Fiebre que no se alivia con los medicamentos. °? Una zona de la mama se pone roja y le causa dolor, y además usted tiene fiebre. °? Una pantorrilla enrojecida y con dolor. °? Repentino e intenso dolor en el pecho. °? Falta de aire. °? Micción dolorosa o con sangre. °? Problemas visuales. °· Vómitos durante 12 horas o más. °· Dolor de cabeza intenso. °· Tiene pensamientos serios acerca de lastimarse a usted misma o dañar al niño o a otra persona. ° °Esta información no tiene como fin reemplazar el consejo del médico. Asegúrese de hacerle al médico cualquier pregunta que  tenga. °Document Released: 05/15/2005 Document Revised: 09/06/2015 Document Reviewed: 11/16/2014 °Elsevier Interactive Patient Education © 2017 Elsevier Inc. ° °

## 2017-01-22 ENCOUNTER — Encounter: Payer: Self-pay | Admitting: Medical

## 2017-01-22 ENCOUNTER — Ambulatory Visit (INDEPENDENT_AMBULATORY_CARE_PROVIDER_SITE_OTHER): Payer: Self-pay | Admitting: Medical

## 2017-01-22 DIAGNOSIS — K5901 Slow transit constipation: Secondary | ICD-10-CM

## 2017-01-22 DIAGNOSIS — Z3202 Encounter for pregnancy test, result negative: Secondary | ICD-10-CM

## 2017-01-22 DIAGNOSIS — Z3009 Encounter for other general counseling and advice on contraception: Secondary | ICD-10-CM

## 2017-01-22 LAB — POCT PREGNANCY, URINE: PREG TEST UR: NEGATIVE

## 2017-01-22 MED ORDER — DOCUSATE SODIUM 250 MG PO CAPS: 250.0000 mg | ORAL_CAPSULE | Freq: Every day | ORAL | 0 refills | Status: DC

## 2017-01-22 MED ORDER — POLYETHYLENE GLYCOL 3350 17 GM/SCOOP PO POWD
ORAL | 0 refills | Status: DC
Start: 2017-01-22 — End: 2018-02-13

## 2017-01-22 MED ORDER — NORETHINDRONE 0.35 MG PO TABS: 1.0000 | ORAL_TABLET | Freq: Every day | ORAL | 11 refills | Status: DC

## 2017-01-22 NOTE — Progress Notes (Addendum)
Subjective:     Lindsey Wells is a 36 y.o. female who presents for a postpartum visit. She is 4 weeks postpartum following a low cervical transverse Cesarean section. I have fully reviewed the prenatal and intrapartum course. The delivery was at 39 gestational weeks. Outcome: repeat cesarean section, low transverse incision. Anesthesia: spinal. Postpartum course has been normal. Baby's course has been normal. Baby is feeding by both breast and bottle - Similac Advance. Bleeding no bleeding. Bowel function is constipation. Bladder function is normal. Patient is not sexually active. Contraception method is none. Postpartum depression screening: negative.  The following portions of the patient's history were reviewed and updated as appropriate: allergies, current medications, past family history, past medical history, past social history, past surgical history and problem list.  Review of Systems Pertinent items are noted in HPI.   Objective:    BP (!) 101/56   Pulse 62   Wt 195 lb 4.8 oz (88.6 kg)   Breastfeeding? Yes   BMI 33.52 kg/m   General:  alert and cooperative   Breasts:  not performed  Lungs: clear to auscultation bilaterally  Heart:  regular rate and rhythm, S1, S2 normal, no murmur, click, rub or gallop  Abdomen: soft, non-tender; bowel sounds normal; no masses,  no organomegaly incision well-healed, no erythema, edema or drainage   Vulva:  not evaluated  Vagina: not evaluated  Cervix:  not evaluated  Corpus: not evaluated  Adnexa:  not evaluated  Rectal Exam: Not performed.        Assessment:     Normal postpartum exam. Pap smear not done at today's visit. Normal 2017 at [facility] Constipation   Plan:    1. Contraception: oral progesterone-only contraceptive 2. Rx sent to pharmacy, advised condoms as back-up for first month 3. Rx sent for Colace and Miralax as well 4. Follow up in: 1 year for annual exam and pap smear or sooner as needed.    Marny Lowenstein,  PA-C 01/22/2017 4:04 PM

## 2017-01-22 NOTE — Patient Instructions (Signed)
Estreimiento - Adultos (Constipation, Adult) Se llama constipacin cuando:  Elimina heces (defeca) menos de 3 veces por semana.  Tiene dificultad para defecar.  Las heces son secas y duras o son ms grandes que lo normal. CUIDADOS EN EL HOGAR  Consuma alimentos con alto contenido de fibra. Por ejemplo, frutas, vegetales, porotos y cereales integrales, como el arroz integral.  Evite los alimentos ricos en grasas y azcar. Estos incluyen patatas fritas, hamburguesas, galletas, dulces y refrescos.  Si no consume suficientes alimentos ricos en fibras, tome productos que tengan agregado de fibra (suplementos).  Beba suficiente lquido para mantener el pis (orina) claro o de color amarillo plido.  Haga ejercicio en forma regular, o como lo indique su mdico.  Vaya al bao cuando sienta la necesidad de defecar. No se aguante las ganas.  Solo tome los medicamentos que le haya indicado su mdico. No tome medicamentos que le ayuden a defecar (laxantes) sin antes consultarlo con su mdico.  SOLICITE AYUDA DE INMEDIATO SI:  Observa sangre brillante en las heces (materia fecal).  El estreimiento dura ms de 4 das o empeora.  Tiene dolor en el vientre (abdominal) o el trasero (recto).  Las heces son delgadas (como un lpiz).  Pierde peso de manera inexplicable.  ASEGRESE DE QUE:  Comprende estas instrucciones.  Controlar su afeccin.  Recibir ayuda de inmediato si no mejora o si empeora.  Esta informacin no tiene como fin reemplazar el consejo del mdico. Asegrese de hacerle al mdico cualquier pregunta que tenga. Document Released: 06/17/2010 Document Revised: 06/05/2014 Document Reviewed: 11/03/2015 Elsevier Interactive Patient Education  2017 Elsevier Inc.  

## 2017-03-13 IMAGING — US US OB TRANSVAGINAL
1 series · 15 of 24 positions shown · non-contrast
Comparison: 08/29/2015 obstetric scan.

CLINICAL DATA: 34-year-old pregnant female presents for follow-up
of suspected intrauterine gestation, with empty sac-like structure
within the endometrial cavity on prior scan performed 11 days prior.

EDC by LMP: 04/20/2016, projecting to an expected gestational age of
8 weeks 0 days.
EXAM:
TRANSVAGINAL OB ULTRASOUND
TECHNIQUE: Transvaginal ultrasound was performed for complete evaluation of the
gestation as well as the maternal uterus, adnexal regions, and
pelvic cul-de-sac.

[Series 1: us ob transvaginal · 24 acquisitions, 15 frames shown]
[im 1/24]
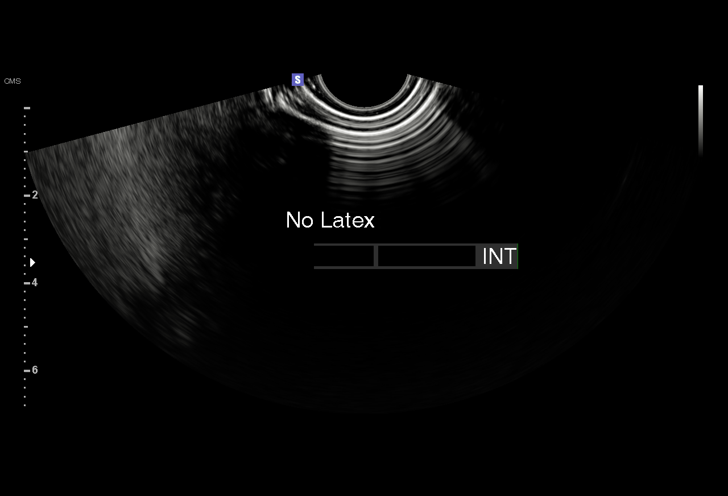
[im 3/24]
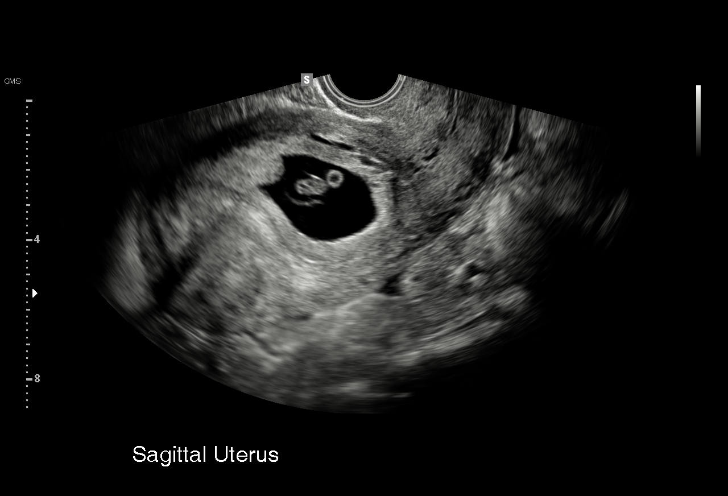
[im 5/24]
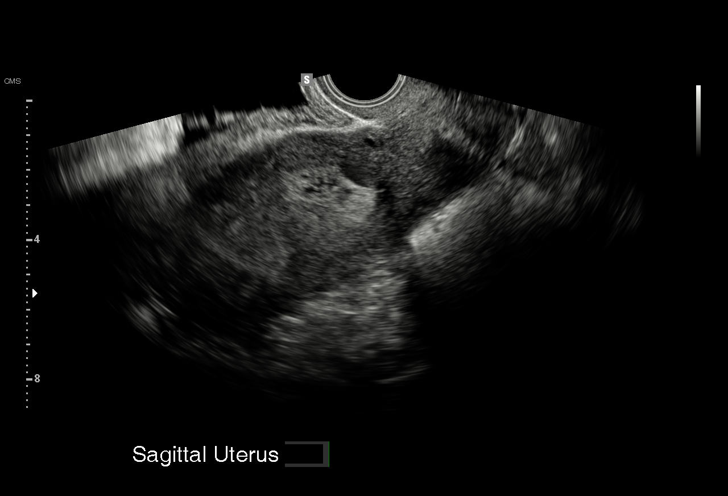
[im 6/24]
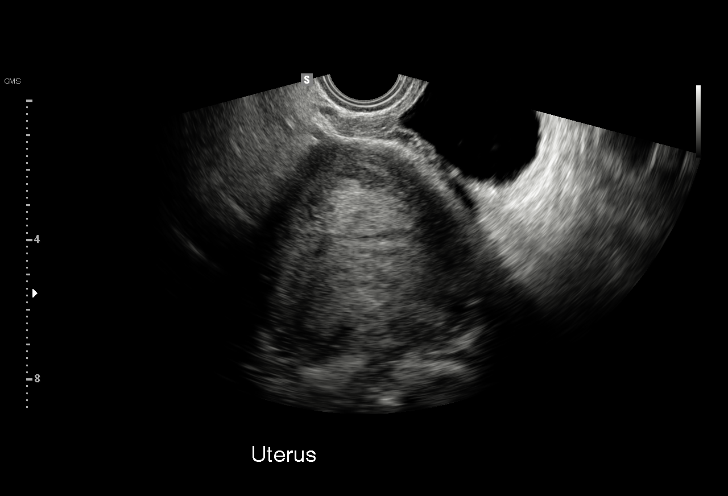
[im 8/24]
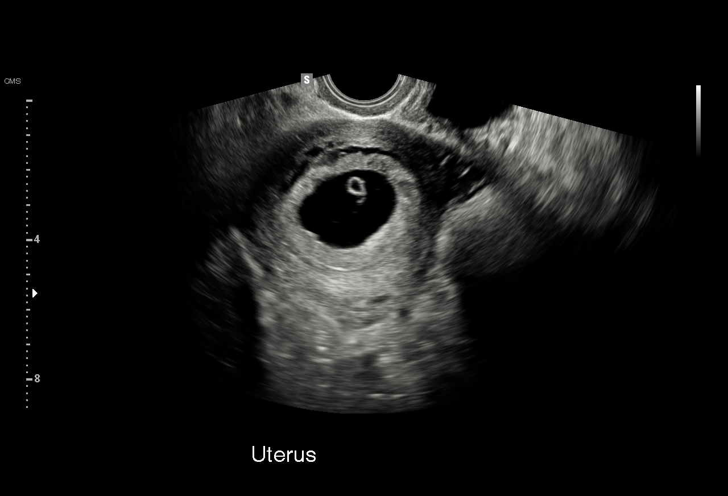
[im 9/24]
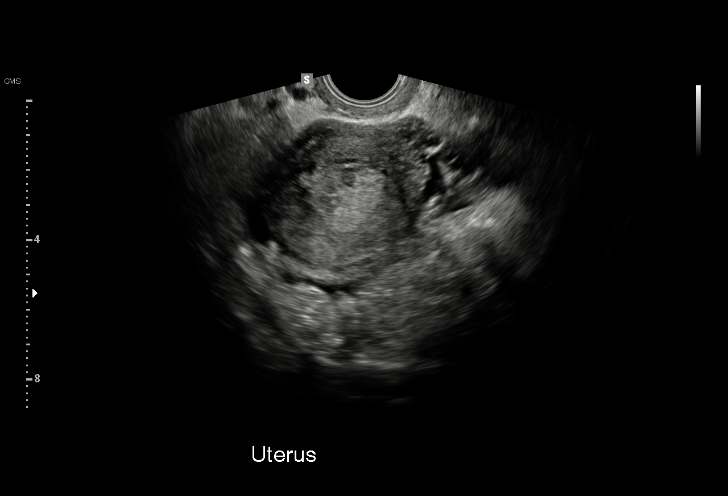
[im 11/24]
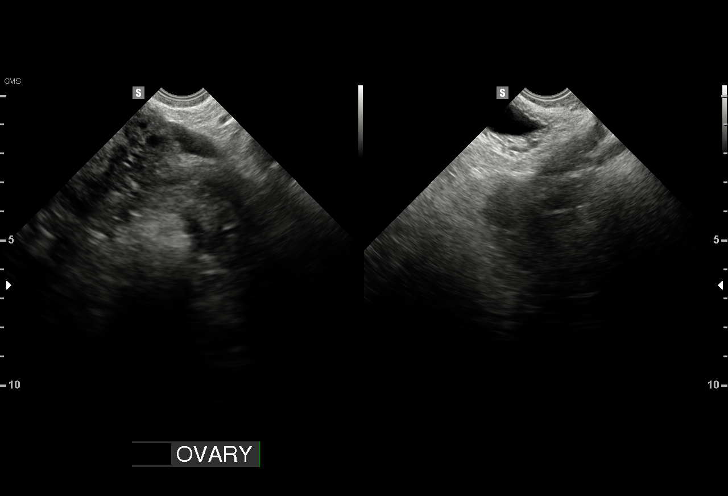
[im 13/24]
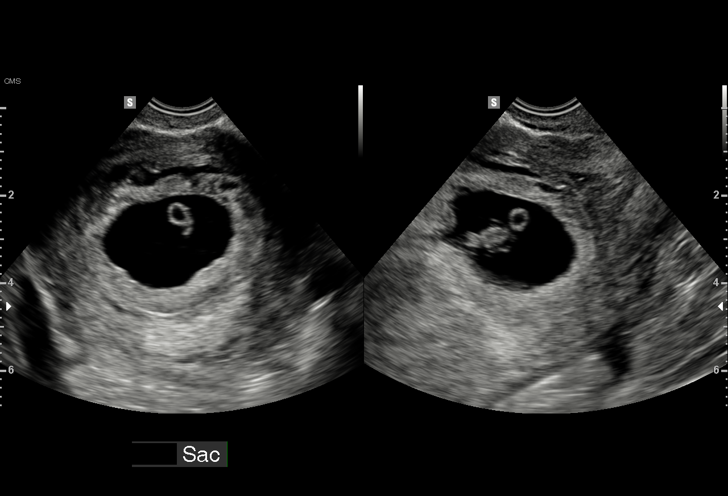
[im 14/24]
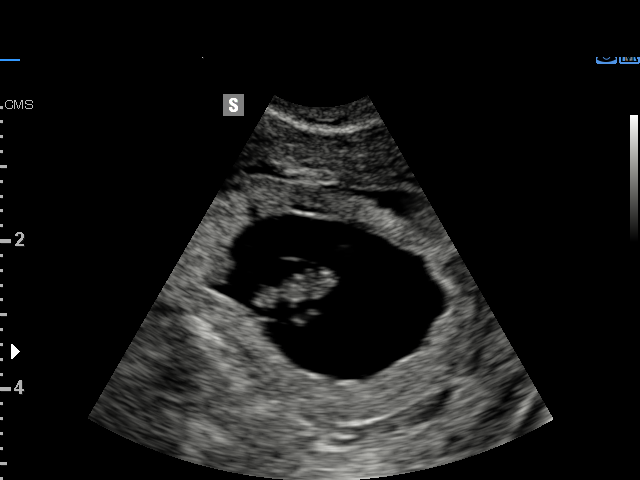
[im 16/24]
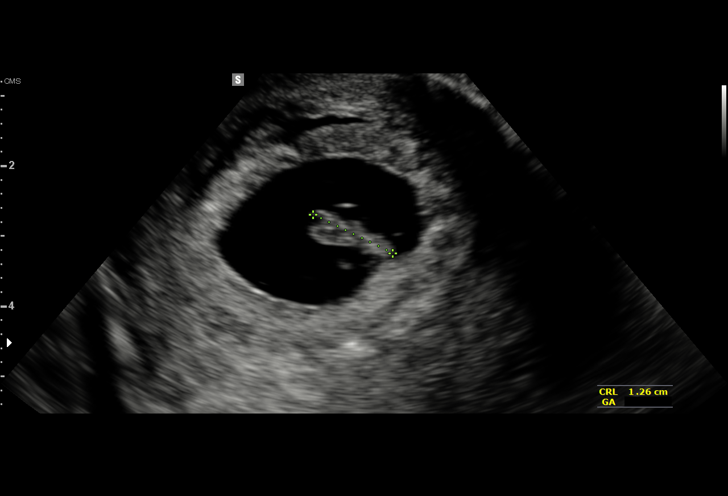
[im 17/24]
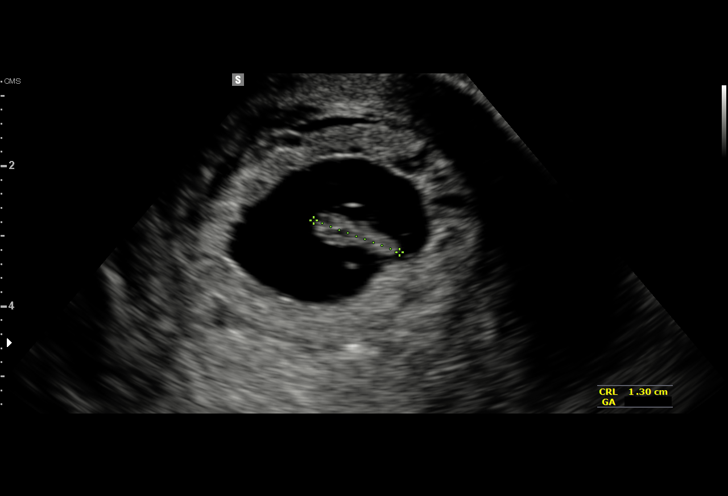
[im 19/24]
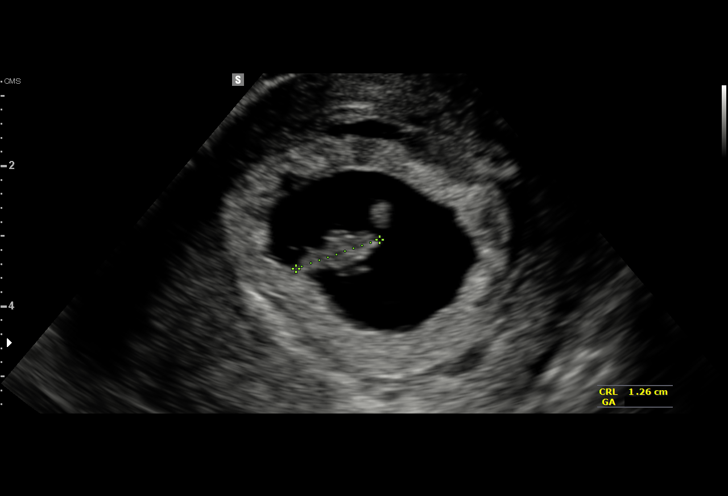
[im 21/24]
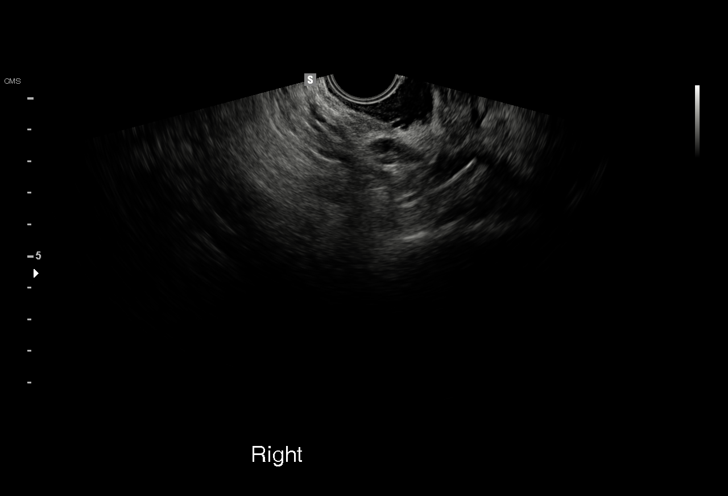
[im 22/24]
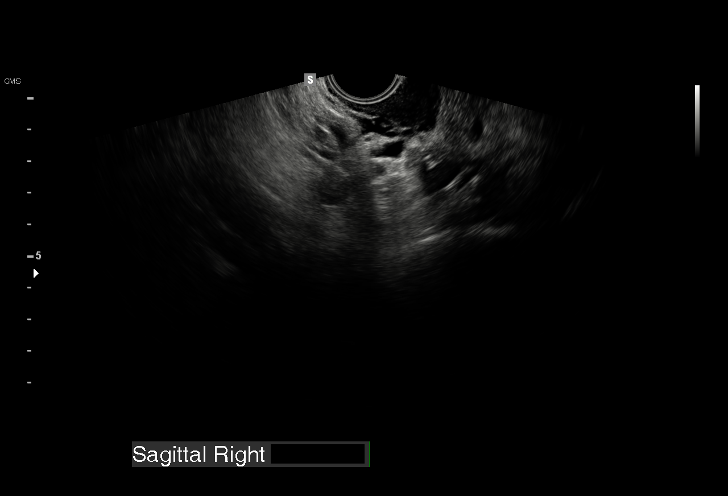
[im 24/24]
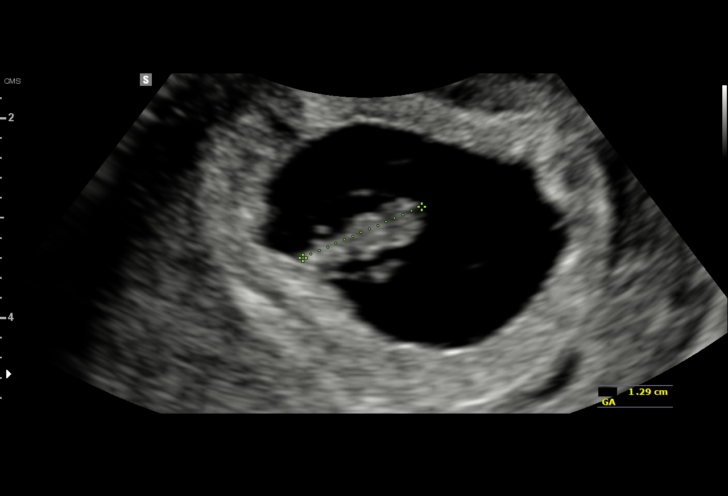

[15 of 24 positions shown; findings below may reference images not displayed]

FINDINGS: Intrauterine gestational sac: Single intrauterine gestational sac
appears normal in size, shape and position.

Yolk sac:  Present and normal.

Embryo:  Present.

Embryonic Cardiac Activity: Regular rate and rhythm.

Embryonic Heart Rate: 152 bpm

CRL:   12.9  mm   7 w 4 d                  US EDC: 04/23/2016

Subchorionic hemorrhage:  None visualized.

Maternal uterus/adnexae: No uterine fibroids. Left ovary measures
3.1 x 2.0 x 2.6 cm. Nonvisualization of the right ovary. No adnexal
masses. No abnormal free fluid in the pelvis.
IMPRESSION: 1. Single living intrauterine gestation at 7 weeks 4 days by
crown-rump length, with no significant discrepancy with the expected
gestational age of 8 weeks 0 days by provided menstrual dating.
2. Normal embryonic cardiac activity.
3. No suspicious adnexal findings.

## 2017-04-18 IMAGING — US US MFM FETAL NUCHAL TRANSLUCENCY
1 series · 15 of 28 positions shown · non-contrast
Comparison: none

[Series 1: us mfm fetal nuchal translucency · 15 of 32 slices shown]
[im 1/32]
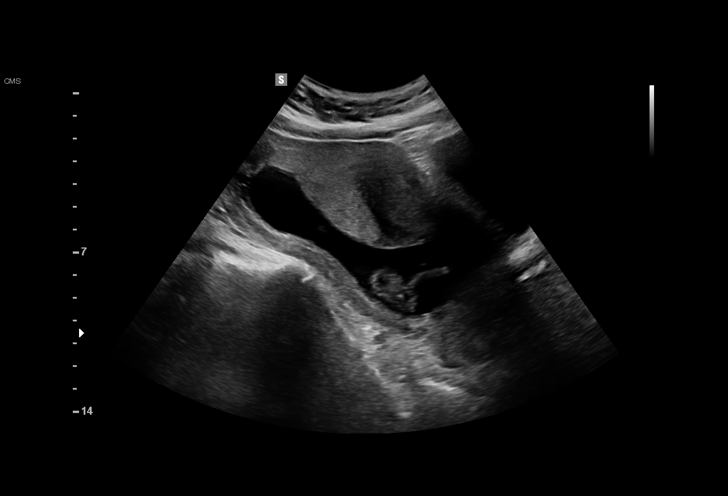
[im 3/32]
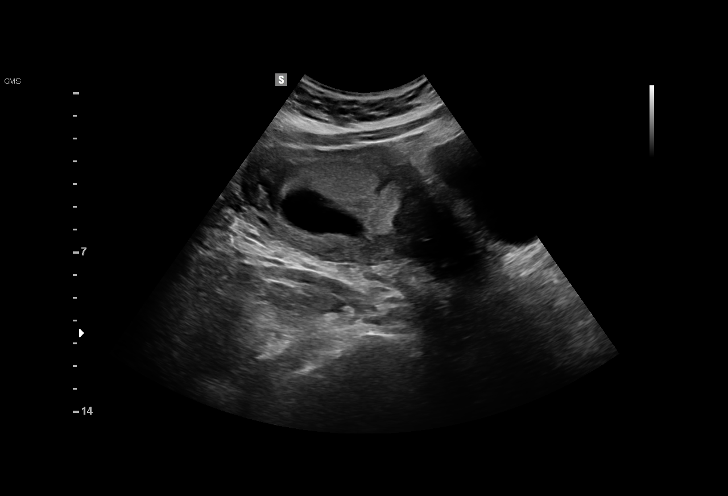
[im 5/32]
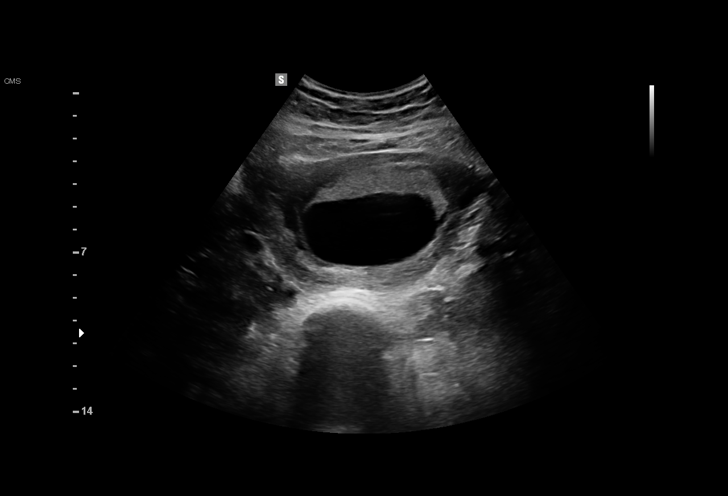
[im 7/32]
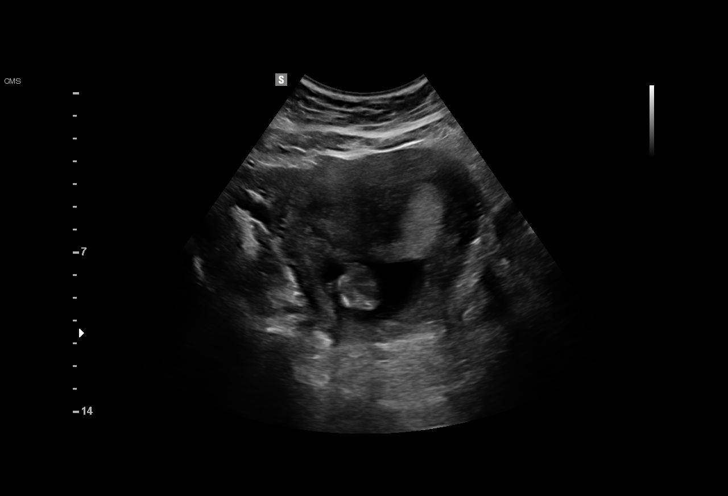
[im 10/32]
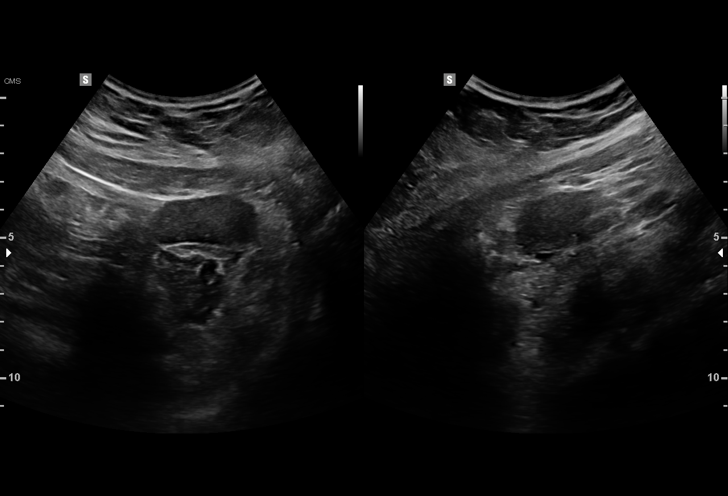
[im 12/32]
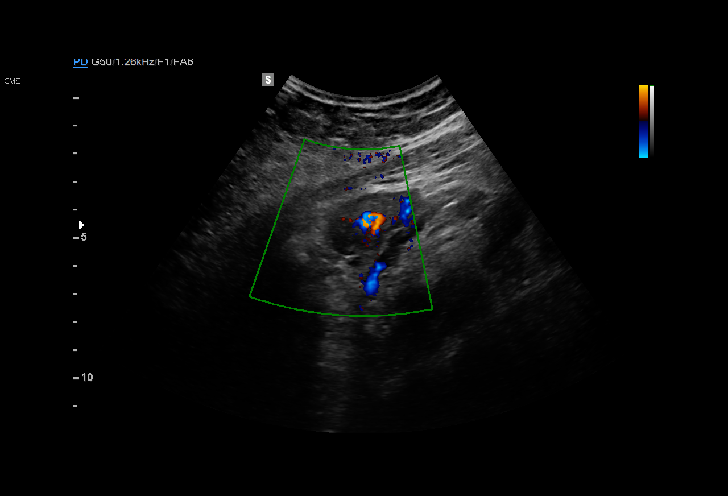
[im 14/32]
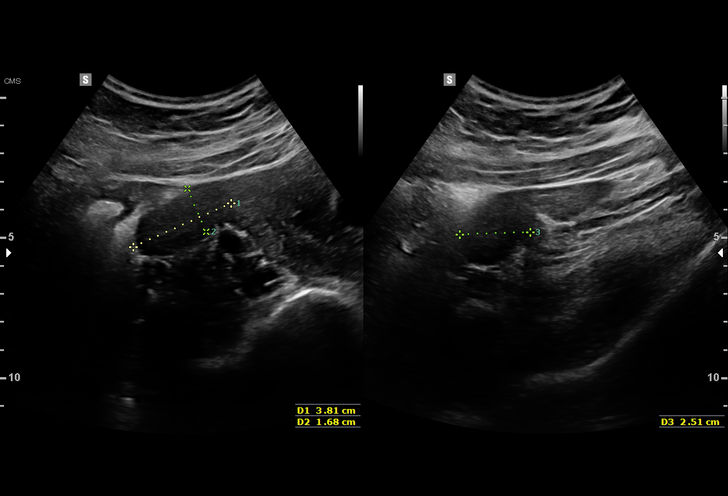
[im 17/32]
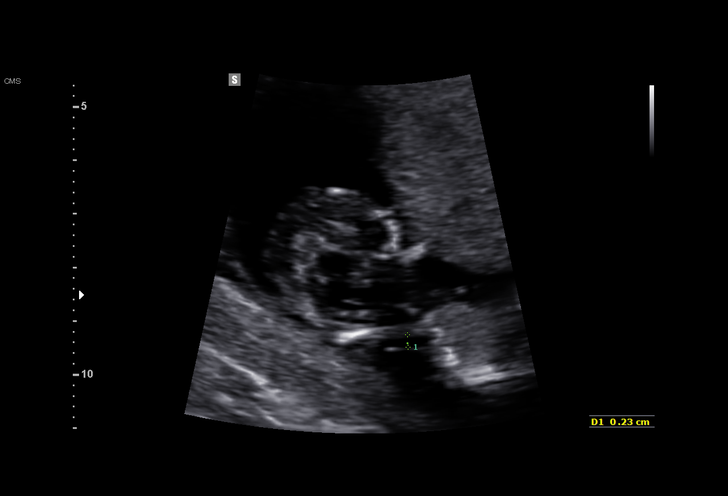
[im 18/32]
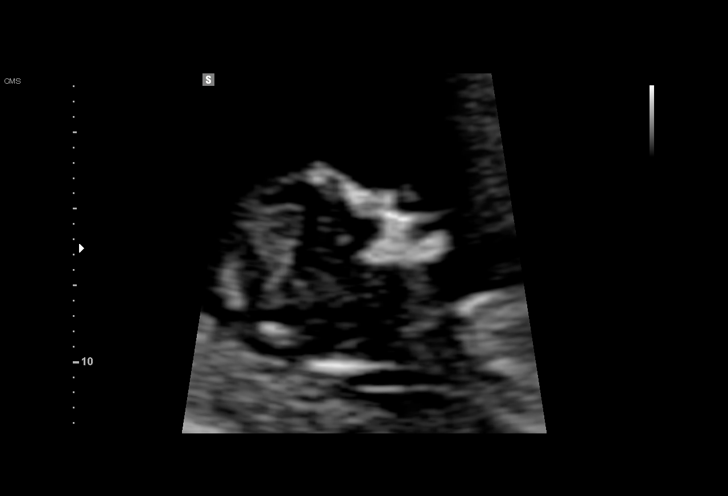
[im 20/32]
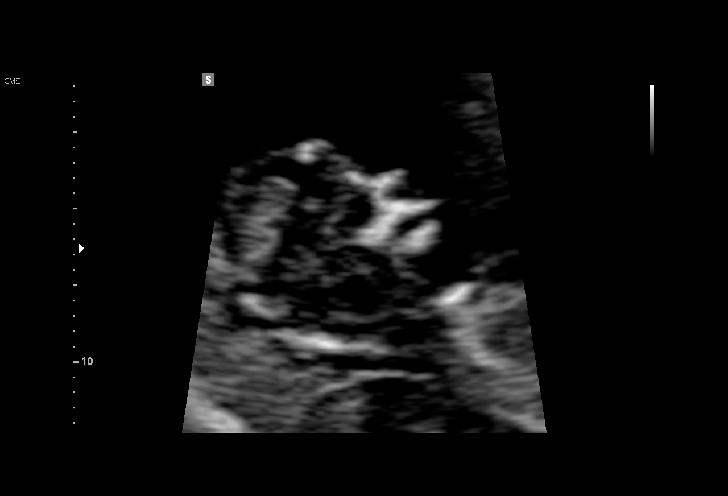
[im 22/32]
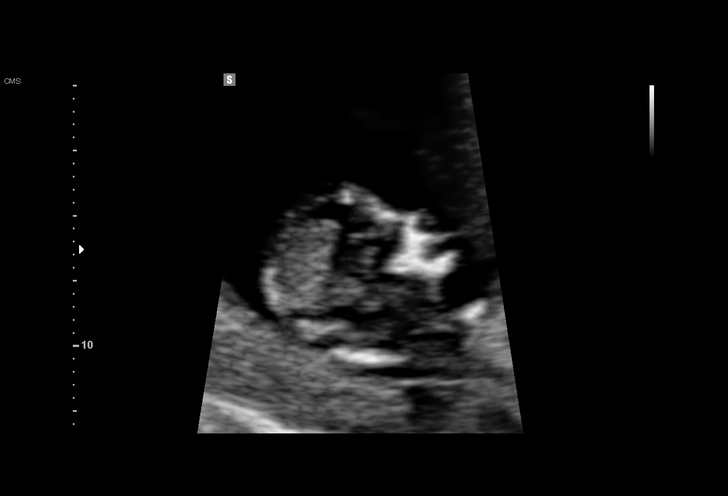
[im 25/32]
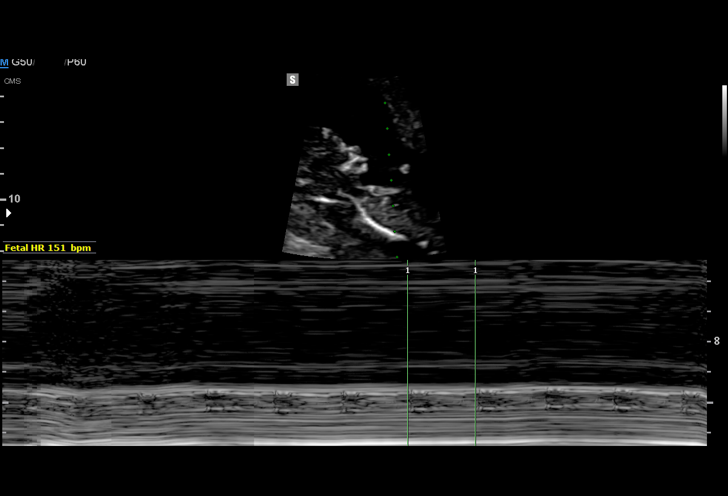
[im 27/32]
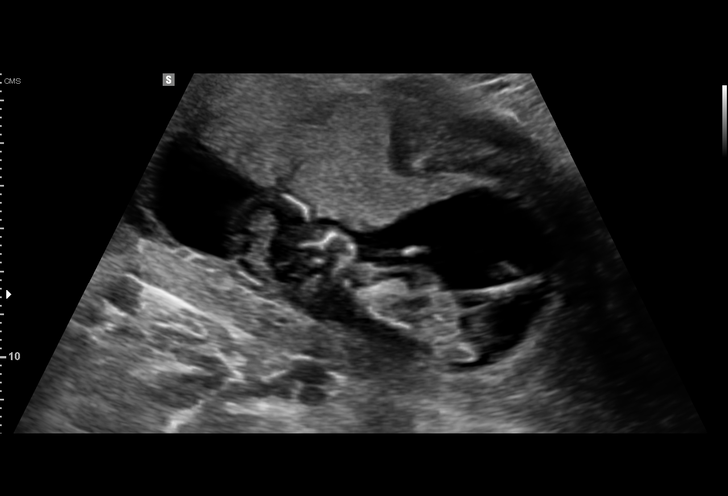
[im 29/32]
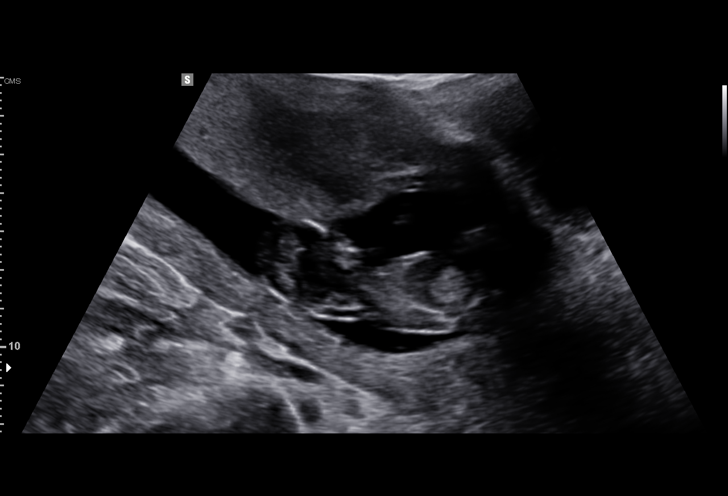
[im 32/32]
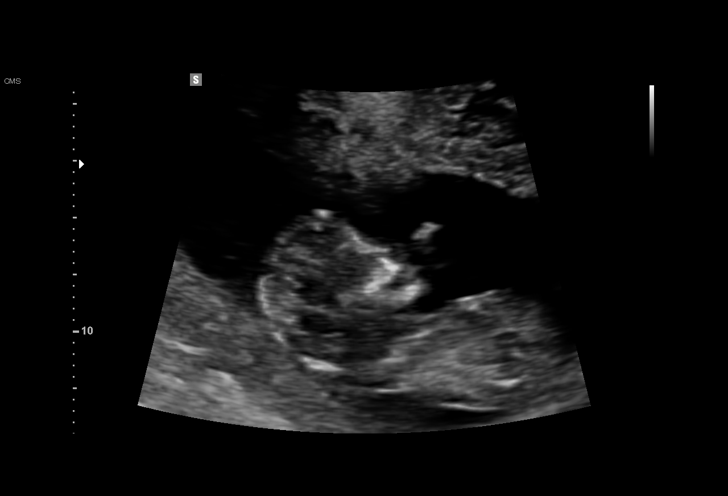

[15 of 28 positions shown; findings below may reference images not displayed]

[REDACTED] Health-
Faculty Physician
AUJLA NP

TRANSLUCENCY

1  NUMERIANO            [id number]      [id number]     [id number]
Indications

12 weeks gestation of pregnancy
First trimester aneuploidy screen (NT)         Z36
Fetal Evaluation

Num Of Fetuses:     1
Preg. Location:     Intrauterine
Gest. Sac:          Intrauterine
Fetal Pole:         Visualized
Fetal Heart         151
Rate(bpm):
Cardiac Activity:   Observed

Amniotic Fluid
AFI FV:      Subjectively within normal limits
Biometry

CRL:      67.1  mm     G. Age:  12w 6d                  EDD:   04/22/16
Gestational Age

LMP:           13w 1d       Date:   07/15/15                 EDD:   04/20/16
Best:          12w 5d    Det. By:   Early Ultrasound         EDD:   04/23/16
(09/09/15)
1st Trimester Genetic Sonogram Screening

CRL:            67.1  mm    G. Age:   12w 6d                 EDD:   04/22/16
Nuc Trans:       2.2  mm
Nasal Bone:                 Present
Cervix Uterus Adnexa

Cervix
Closed.

Uterus
No abnormality visualized.

Left Ovary
Within normal limits.

Right Ovary
Within normal limits.

Cul De Sac:   No free fluid seen.

Adnexa:       No abnormality visualized.
Impression

Single IUP at 12w 5d
Normal NT (2.2 mm); Nasal bone visualized
First trimester aneuploidy screen performed as noted above.
Please do not draw triple/quad screen, though patient should
be offered MSAFP for neural tube defect screening.
Recommendations

Recommend ultrasound for fetal anatomy at 18-20 weeks

## 2017-06-30 IMAGING — US US MFM OB TRANSVAGINAL
2 series · 15 of 28 positions shown · non-contrast
Comparison: none

[Series 1: us mfm ob transvaginal · 24 acquisitions, 13 frames shown (1 of 2)]
[im 1/24]
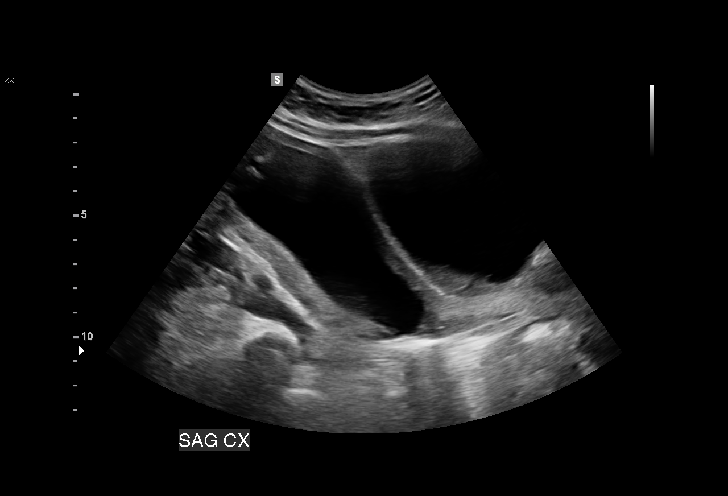
[im 3/24]
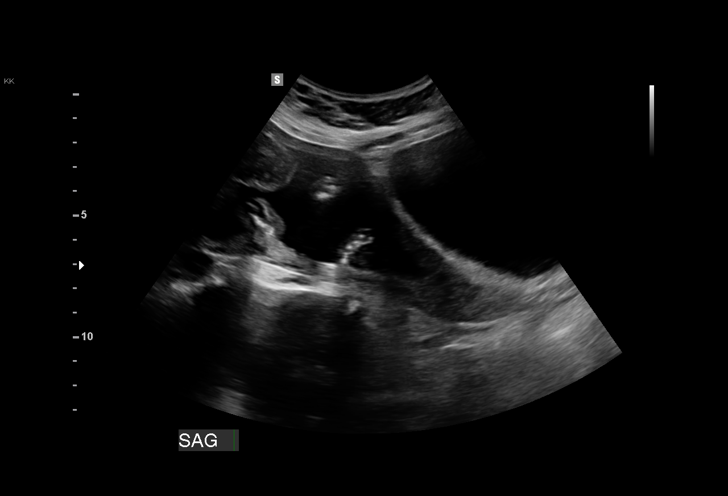
[im 5/24]
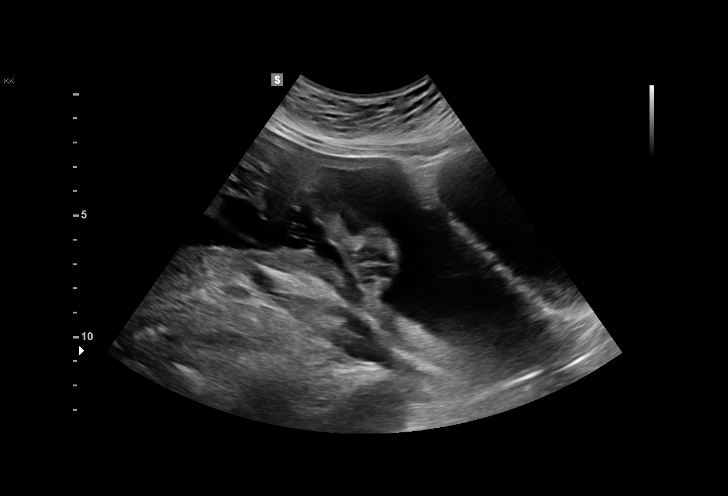
[im 7/24]
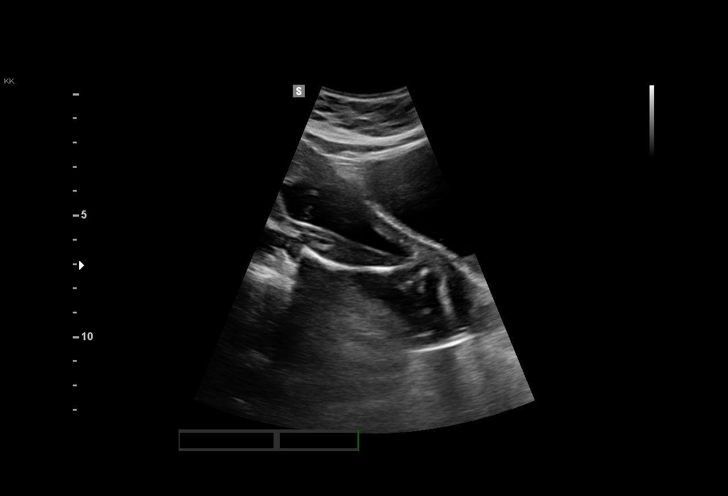
[im 9/24]
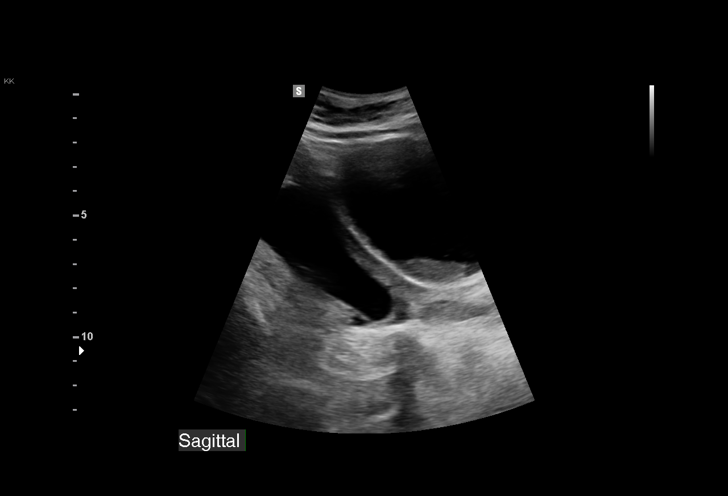
[im 11/24]
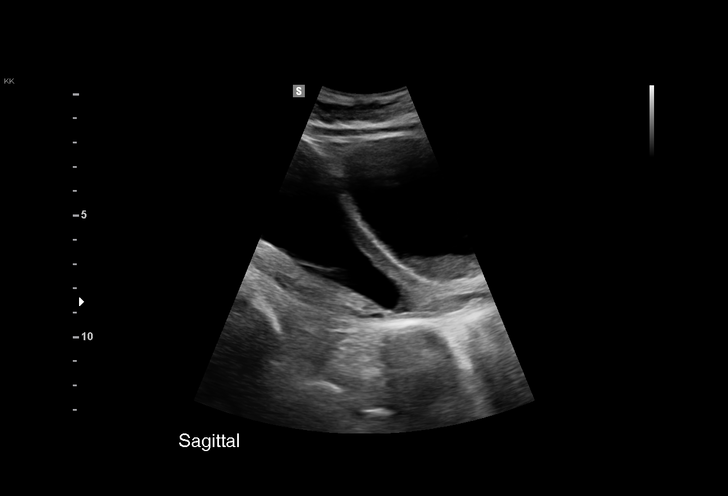
[im 13/24]
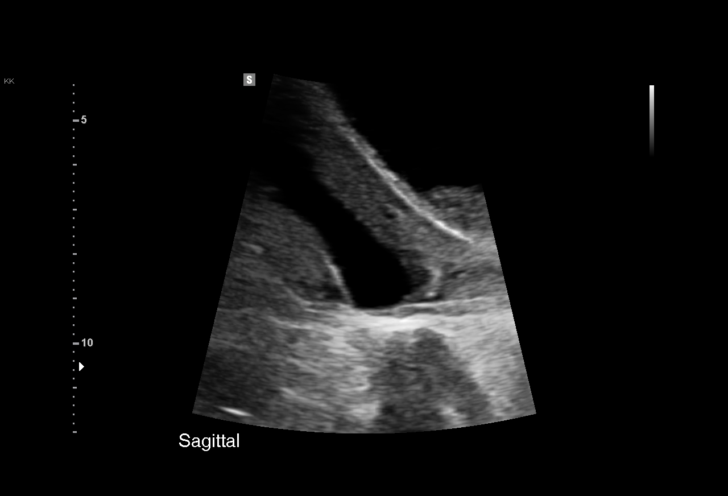
[im 15/24]
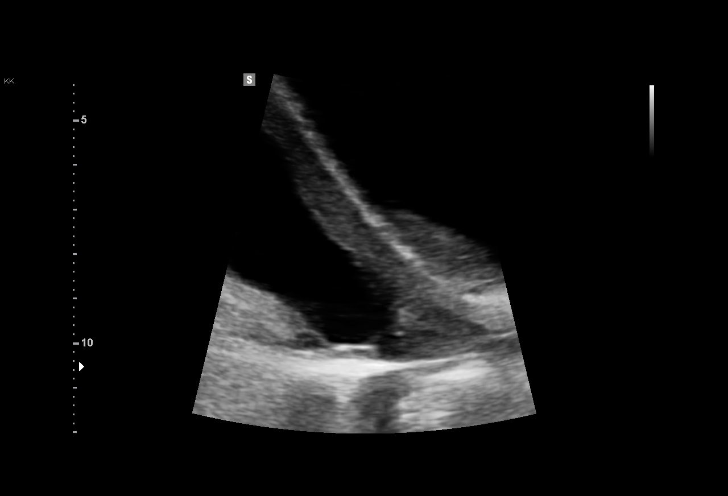
[im 16/24]
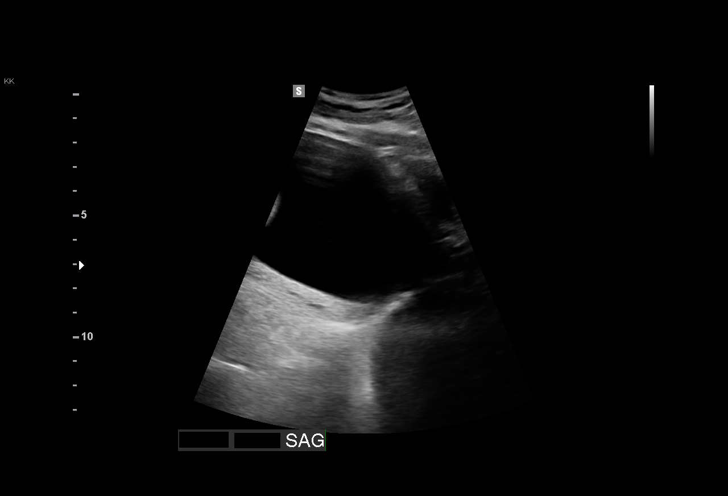
[im 18/24]
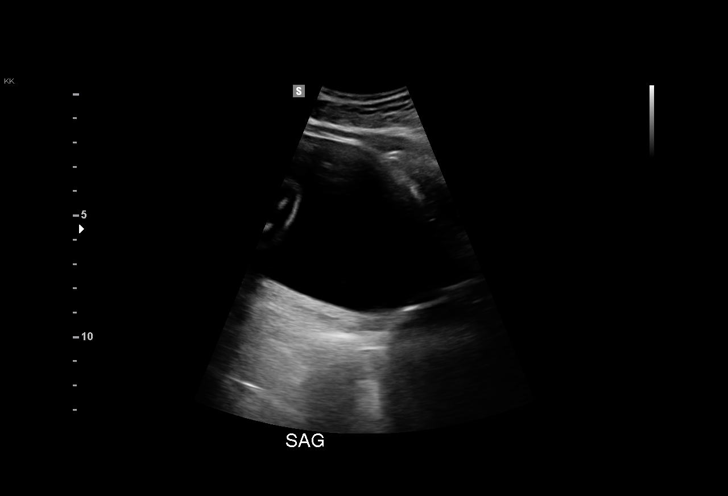
[im 20/24]
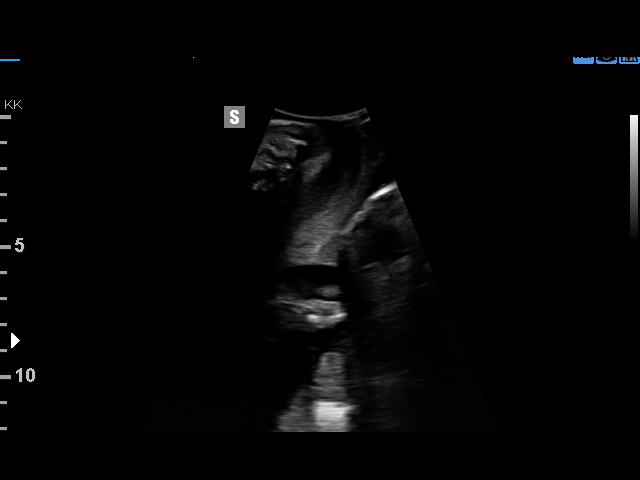
[im 22/24]
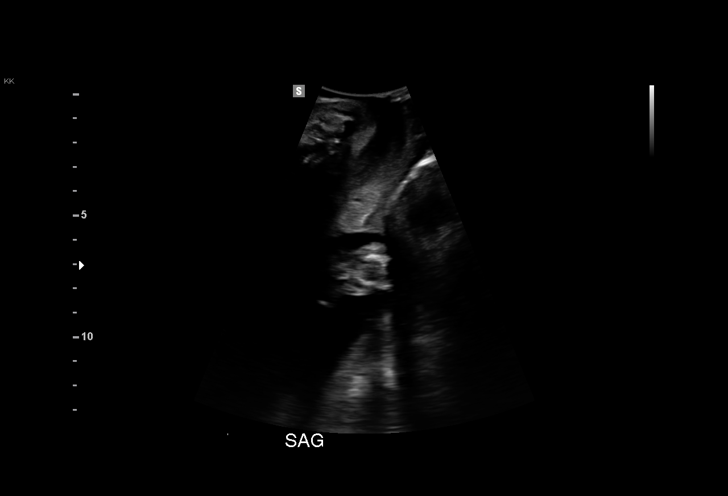
[im 24/24]
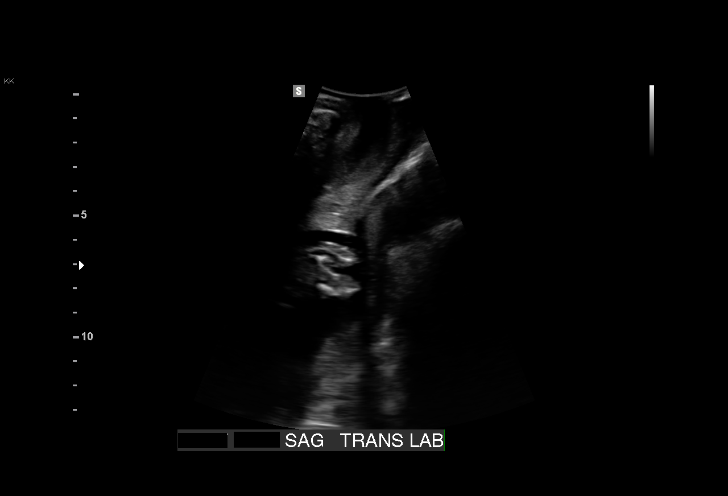

[Series 2: us mfm ob transvaginal · 2 of 4 slices shown (2 of 2)]
[im 2/4]
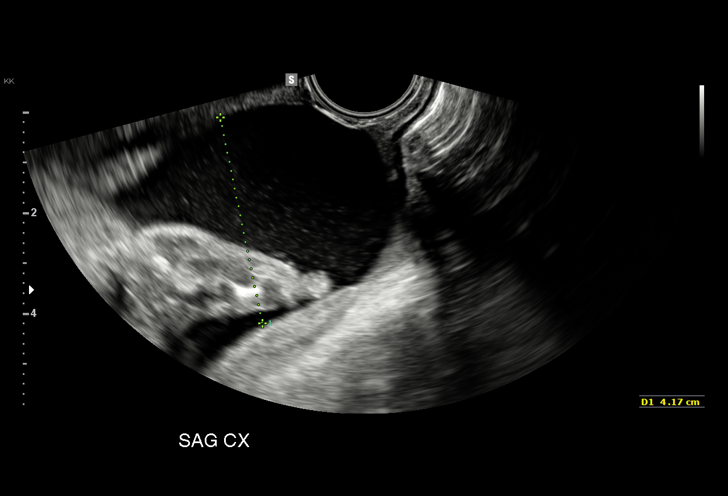
[im 4/4]
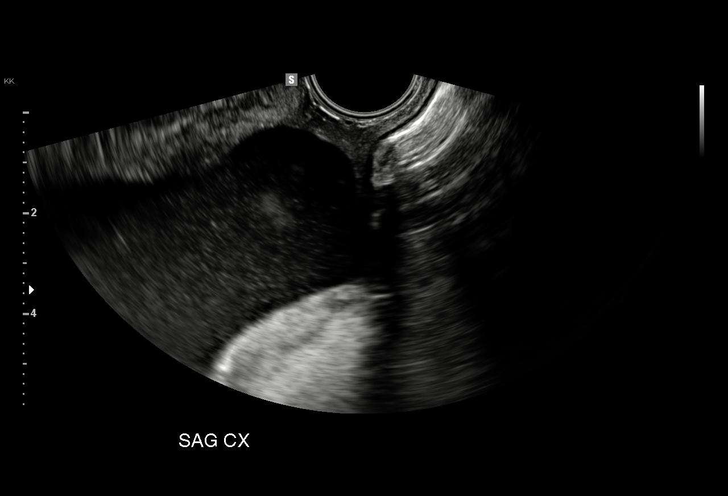

[15 of 28 positions shown; findings below may reference images not displayed]

[REDACTED]

1  NIMMYEL              [id number]      [id number]     [id number]
Indications

23 weeks gestation of pregnancy
Cervical incompetence, second trimester
Fetal Evaluation

Num Of Fetuses:     1
Fetal Heart         140
Rate(bpm):
Cardiac Activity:   Observed
Presentation:       Breech, footling
Placenta:           Anterior, above cervical os

Amniotic Fluid
AFI FV:      Subjectively within normal limits

Largest Pocket(cm)
6.4
Gestational Age

LMP:           23w 4d       Date:   07/15/15                 EDD:   04/20/16
Best:          23w 1d    Det. By:   Early Ultrasound         EDD:   04/23/16
(09/09/15)
Cervix Uterus Adnexa

Cervix
No measurable cervix transvaginally; see comments
Impression

Single IUP at 23w 1d
Limited ultrasound performed to evaluate cervix

TVUS - no measurable cervix.  Bulging bag / fetal
membranes noted in the vagina.  The cerix is dilated 
 3-4
cm (post void).  Fetal feet noted at the cervix.
Recommendations

See separate consult note.
Do not feel that the patient is a candidate for trial of pessary
given advanced cervical dilation.

## 2018-02-13 ENCOUNTER — Encounter: Payer: Self-pay | Admitting: Family Medicine

## 2018-02-13 ENCOUNTER — Ambulatory Visit (INDEPENDENT_AMBULATORY_CARE_PROVIDER_SITE_OTHER): Payer: Self-pay | Admitting: Family Medicine

## 2018-02-13 ENCOUNTER — Encounter: Payer: Self-pay | Admitting: *Deleted

## 2018-02-13 VITALS — BP 118/60 | HR 59 | Wt 199.2 lb

## 2018-02-13 DIAGNOSIS — Z98891 History of uterine scar from previous surgery: Secondary | ICD-10-CM

## 2018-02-13 DIAGNOSIS — Z23 Encounter for immunization: Secondary | ICD-10-CM

## 2018-02-13 DIAGNOSIS — O3430 Maternal care for cervical incompetence, unspecified trimester: Secondary | ICD-10-CM

## 2018-02-13 DIAGNOSIS — Z8751 Personal history of pre-term labor: Secondary | ICD-10-CM

## 2018-02-13 DIAGNOSIS — Z789 Other specified health status: Secondary | ICD-10-CM

## 2018-02-13 DIAGNOSIS — O0992 Supervision of high risk pregnancy, unspecified, second trimester: Secondary | ICD-10-CM | POA: Insufficient documentation

## 2018-02-13 HISTORY — DX: Supervision of high risk pregnancy, unspecified, second trimester: O09.92

## 2018-02-13 LAB — POCT URINALYSIS DIP (DEVICE)
BILIRUBIN URINE: NEGATIVE
GLUCOSE, UA: NEGATIVE mg/dL
Ketones, ur: NEGATIVE mg/dL
Nitrite: NEGATIVE
Protein, ur: NEGATIVE mg/dL
SPECIFIC GRAVITY, URINE: 1.02 (ref 1.005–1.030)
UROBILINOGEN UA: 0.2 mg/dL (ref 0.0–1.0)
pH: 6.5 (ref 5.0–8.0)

## 2018-02-13 MED ORDER — PRENATAL 27-0.8 MG PO TABS: 1.0000 | ORAL_TABLET | Freq: Every day | ORAL | 3 refills | Status: DC

## 2018-02-13 NOTE — Progress Notes (Signed)
   PRENATAL VISIT NOTE *In-person Spanish interpretor used for visit.* Subjective:  Lindsey Wells is a 37 y.o. [id number] at 7540w6d being seen today for ongoing prenatal care.  She is currently monitored for the following issues for this high-risk pregnancy and has S/P cesarean section; Language barrier; Supervision of high risk pregnancy, antepartum, second trimester; and History of preterm labor on their problem list.  - has been on vaginal progesterone before (2nd pregnancy preterm), last pregnancy with cervical length of 28mm and some mild funneling  - short interval pregnancy (last delivery July 2018)- scheduled repeat C/S - interested in possible TOLAC if possible   Patient reports no complaints.  Contractions: Not present. Vag. Bleeding: None.  Movement: Absent. Denies leaking of fluid.   The following portions of the patient's history were reviewed and updated as appropriate: allergies, current medications, past family history, past medical history, past social history, past surgical history and problem list. Problem list updated.  Objective:   Vitals:   02/13/18 1042  BP: 118/60  Pulse: (!) 59  Weight: 199 lb 3.2 oz (90.4 kg)    Fetal Status: Fetal Heart Rate (bpm): 166   Movement: Absent     General:  Alert, oriented and cooperative. Patient is in no acute distress.  Skin: Skin is warm and dry. No rash noted.   Cardiovascular: Normal heart rate noted  Respiratory: Normal respiratory effort, no problems with respiration noted  Abdomen: Soft, gravid, appropriate for gestational age.  Pain/Pressure: Absent     Pelvic: Cervical exam deferred        Extremities: Normal range of motion.  Edema: None  Mental Status: Normal mood and affect. Normal behavior. Normal judgment and thought content.   Assessment and Plan:  Pregnancy: [id number] at 7440w6d  1. Supervision of high risk pregnancy, antepartum, second trimester - Culture, OB Urine - Cystic fibrosis gene test -  Genetic Screening - Hemoglobinopathy Evaluation - Obstetric Panel, Including HIV - SMN1 COPY NUMBER ANALYSIS (SMA Carrier Screen) - US MFM OB DETAIL +14 WK; Future - Flu Vaccine QUAD 36+ mos IM  2. History of preterm labor  History of Cervical Insufficiency: Preterm labor (pelvic pressure) and delivery of 2nd pregnancy resulting in neonatal demise. Had presented to prenatal provider with 3cm dilated cervix and BBOW. 3rd pregnancy with asymptomatic cervical insufficiency 20-1324mm, on vaginal progesterone.  -- plan to start Makena, ordered today, between 16-20 weeks - depending on cervical length, may consider switch to vaginal progesterone  -- scheduled U/S to obtain cervical length   3. S/P cesarean section: First C/S for preterm infant - breech presentation, active labor, chorioamnionitis after admission for preterm labor, cervical incompetence. Last C/S last year was schedule repeat.  -- interested in TOLAC if possible, will continue to address and counsel at future visits -- VBAC success rate predicted at 63.6%   Preterm labor symptoms and general obstetric precautions including but not limited to vaginal bleeding, contractions, leaking of fluid and fetal movement were reviewed in detail with the patient. Please refer to After Visit Summary for other counseling recommendations.  Return in about 4 weeks (around 03/13/2018) for return OB.  Future Appointments  Date Time Provider Department Center  03/07/2018  8:00 AM [facility] US 3 [facility]-MFCUS MFC-US  03/13/2018  1:15 PM [place] BingPickens, Charlie, MD [facility]  03/13/2018  1:40 PM [facility]   [address], [place]

## 2018-02-13 NOTE — Patient Instructions (Signed)
Hydroxyprogesterone solution for injection Qu es este medicamento? La HIDROXIPROGESTERONA es una hormona femenina. Este medicamento se [place] Islandsutiliza en las mujeres embarazadas que han dado a luz a un beb demasiado pronto (prematuro) en el pasado. Ayuda a reducir Nurse, adultel riesgo de tener un beb prematuro de [place]. Este medicamento puede ser utilizado para otros usos; si tiene alguna pregunta consulte con su proveedor de atencin mdica o con su farmacutico. MARCAS COMUNES: Makena Qu le debo informar a mi profesional de la salud antes de tomar este medicamento? Necesita saber si usted presenta alguno de los siguientes problemas o situaciones: trastornos de Sports administratorcoagulacin cncer de mama, cervical, uterino o vaginal depresin diabetes o prediabetes enfermedad cardiaca alta presin sangunea enfermedad renal enfermedad heptica enfermedad pulmonar o respiratoria, como asma migraas convulsiones sangrado vaginal una reaccin alrgica o inusual a la hidroxiprogesterona, a otras hormonas, medicamentos, alimentos, colorantes, aceite de ricino, alcohol benclico o conservantes si est amamantando a un beb Cmo debo utilizar este medicamento? Este medicamento se administra mediante inyeccin en un msculo. Lo administra un profesional de Radiographer, therapeuticla salud en un hospital o en un entorno clnico. Es posible que reciba una inyeccin una vez por semana para evitar un parto prematuro. Hable con su pediatra para informarse acerca del uso de este medicamento en nios. Puede requerir atencin especial. Sobredosis: Pngase en contacto inmediatamente con un centro toxicolgico o una sala de urgencia si usted cree que haya tomado demasiado medicamento. ATENCIN: Reynolds AmericanEste medicamento es solo para usted. No comparta este medicamento con nadie. Qu sucede si me olvido de una dosis? Es importante no olvidar ninguna dosis. Informe a su mdico o a su profesional de la salud si no puede asistir a Marketing executiveuna cita. Qu puede interactuar con este  medicamento? acetaminofeno bupropion clozapina efavirenz halotano metadona nicotina teofilina, aminofilina tizanidina Puede ser que esta lista no menciona todas las posibles interacciones. Informe a su profesional de Beazer Homesla salud de [employer] Inctodos los productos a base de hierbas, medicamentos de [place] libre o suplementos nutritivos que est tomando. Si usted fuma, consume bebidas alcohlicas o si utiliza drogas ilegales, indqueselo tambin a su profesional de Beazer Homesla salud. Algunas sustancias pueden interactuar con su medicamento. A qu debo estar atento al usar PPL Corporationeste medicamento? Se supervisar su estado de salud atentamente mientras reciba este medicamento. Qu efectos secundarios puedo tener al Boston Scientificutilizar este medicamento? Efectos secundarios que debe informar a su mdico o a Producer, television/film/videosu profesional de la salud tan pronto como sea posible: Therapist, artreacciones alrgicas como erupcin cutnea, picazn o urticarias, hinchazn de la cara, labios o lengua problemas respiratorios cambios o secrecin de las mamas cambios en la visin confusin, problemas para hablar o comprender humor deprimido aumento de apetito o sed aumento de la necesidad de Development worker, international aidorinar dolor, enrojecimiento o Marketing executiveirritacin en el lugar de Doctor, hospitalla inyeccin dolor, hinchazon, sensacin clida en la pierna falta de aliento, dolor en el pecho, hinchazn de la pierna entumecimiento o debilidad repentina de la cara, brazo o pierna dolores de cabeza repentinas severas dificultad para caminar, mareos, prdida de equilibrio o coordinacin cansancio o debilidad inusual sangrado vaginal color amarillento de los ojos o la piel Efectos secundarios que, por lo general, no requieren atencin mdica (debe informarlos a su mdico o a su profesional de la salud si persisten o si son molestos): cambios de emociones o de humor diarrea retencin de lquidos o hinchazn nuseas Puede ser que esta lista no menciona todos los posibles efectos secundarios. Comunquese a su mdico por asesoramiento mdico Devon Energysobre  los efectos secundarios. Usted puede eBayinformar los efectos  secundarios a la FDA por telfono al 1-800-FDA-1088. Dnde debo guardar mi medicina? Este medicamento se administra en hospitales o clnicas y no necesitar guardarlo en su domicilio. ATENCIN: Este folleto es un resumen. Puede ser que no cubra toda la posible informacin. Si usted tiene preguntas acerca de esta medicina, consulte con su mdico, su farmacutico o su profesional de la salud.  2018 Elsevier/Gold Standard (2016-06-15 00:00:00)  

## 2018-02-13 NOTE — Progress Notes (Signed)
17p application completed & faxed

## 2018-02-13 NOTE — BH Specialist Note (Deleted)
Integrated Behavioral Health Initial Visit  MRN: [id number] Name: Lindsey Wells  Number of Integrated Behavioral Health Clinician visits:: 1/6 Session Start time: ***  Session End time: *** Total time: {IBH Total Time:21014050}  Type of Service: Integrated Behavioral Health- Individual/Family Interpretor:Yes.   Interpretor Name and Language: Spanish ***   Warm Hand Off Completed.       SUBJECTIVE: Lindsey Wells is a 37 y.o. female accompanied by {CHL AMB ACCOMPANIED YQ:[id number]}BY:[phone]} Patient was referred by Rhett BannisterLaurel Wallace, DO for Initial OB introduction to integrated behavioral health services . Patient reports the following symptoms/concerns: *** Duration of problem: ***; Severity of problem: {Mild/Moderate/Severe:20260}  OBJECTIVE: Mood: {BHH MOOD:22306} and Affect: {BHH AFFECT:22307} Risk of harm to self or others: {CHL AMB BH Suicide Current Mental Status:21022748}  LIFE CONTEXT: Family and Social: *** School/Work: *** Self-Care: *** Life Changes: Current pregnancy ***  GOALS ADDRESSED: Patient will: 1. Reduce symptoms of: {IBH Symptoms:21014056} 2. Increase knowledge and/or ability of: {IBH Patient Tools:21014057}  3. Demonstrate ability to: {IBH Goals:21014053}  INTERVENTIONS: Interventions utilized: {IBH Interventions:21014054}  Standardized Assessments completed: GAD-7 and PHQ 9  ASSESSMENT: Patient currently experiencing Supervision of *** pregnancy, ***.   Patient may benefit from Initial OB introduction to integrated behavioral health services .  PLAN: 1. Follow up with behavioral health clinician on : As needed *** 2. Behavioral recommendations: *** 3. Referral(s): {IBH Referrals:21014055} 4. "From scale of 1-10, how likely are you to follow plan?": ***  Valetta CloseJamie C McMannes, LCSW  ***

## 2018-02-14 LAB — OBSTETRIC PANEL, INCLUDING HIV
ANTIBODY SCREEN: NEGATIVE
BASOS ABS: 0 10*3/uL (ref 0.0–0.2)
BASOS: 0 %
EOS (ABSOLUTE): 0.2 10*3/uL (ref 0.0–0.4)
Eos: 3 %
HEMATOCRIT: 36.1 % (ref 34.0–46.6)
HEMOGLOBIN: 12.5 g/dL (ref 11.1–15.9)
HEP B S AG: NEGATIVE
HIV SCREEN 4TH GENERATION: NONREACTIVE
IMMATURE GRANS (ABS): 0 10*3/uL (ref 0.0–0.1)
Immature Granulocytes: 0 %
Lymphocytes Absolute: 2.6 10*3/uL (ref 0.7–3.1)
Lymphs: 34 %
MCH: 27.9 pg (ref 26.6–33.0)
MCHC: 34.6 g/dL (ref 31.5–35.7)
MCV: 81 fL (ref 79–97)
MONOCYTES: 7 %
Monocytes Absolute: 0.5 10*3/uL (ref 0.1–0.9)
NEUTROS ABS: 4.2 10*3/uL (ref 1.4–7.0)
Neutrophils: 56 %
Platelets: 227 10*3/uL (ref 150–450)
RBC: 4.48 x10E6/uL (ref 3.77–5.28)
RDW: 14.4 % (ref 12.3–15.4)
RPR: NONREACTIVE
RUBELLA: 21 {index} (ref >0.99)
Rh Factor: POSITIVE
WBC: 7.6 10*3/uL (ref 3.4–10.8)

## 2018-02-15 ENCOUNTER — Other Ambulatory Visit (HOSPITAL_COMMUNITY): Payer: Self-pay

## 2018-02-15 ENCOUNTER — Encounter: Payer: Self-pay | Admitting: Family Medicine

## 2018-02-15 ENCOUNTER — Telehealth: Payer: Self-pay | Admitting: General Practice

## 2018-02-15 DIAGNOSIS — O3430 Maternal care for cervical incompetence, unspecified trimester: Secondary | ICD-10-CM | POA: Insufficient documentation

## 2018-02-15 LAB — URINE CULTURE, OB REFLEX

## 2018-02-15 LAB — CULTURE, OB URINE

## 2018-02-15 NOTE — Telephone Encounter (Signed)
Called [facility] & shipment of Lindsey Wells has been set up. Should receive by 9/27.

## 2018-02-18 ENCOUNTER — Encounter: Payer: Self-pay | Admitting: *Deleted

## 2018-02-27 ENCOUNTER — Ambulatory Visit (INDEPENDENT_AMBULATORY_CARE_PROVIDER_SITE_OTHER): Payer: Self-pay | Admitting: *Deleted

## 2018-02-27 VITALS — BP 109/66 | HR 75 | Ht 65.0 in | Wt 200.0 lb

## 2018-02-27 DIAGNOSIS — O09212 Supervision of pregnancy with history of pre-term labor, second trimester: Secondary | ICD-10-CM

## 2018-02-27 DIAGNOSIS — Z8751 Personal history of pre-term labor: Secondary | ICD-10-CM

## 2018-02-27 DIAGNOSIS — O09299 Supervision of pregnancy with other poor reproductive or obstetric history, unspecified trimester: Secondary | ICD-10-CM

## 2018-02-27 MED ORDER — HYDROXYPROGESTERONE CAPROATE 275 MG/1.1ML ~~LOC~~ SOAJ
275.0000 mg | Freq: Once | SUBCUTANEOUS | Status: AC
  Administered 2018-02-27: 275 mg via SUBCUTANEOUS

## 2018-02-27 NOTE — Progress Notes (Signed)
I have reviewed this chart and agree with the RN/CMA assessment and management.    K. Meryl Davis, M.D. [facility]  

## 2018-02-27 NOTE — Progress Notes (Signed)
Video interpreter Alpha ID# [phone] used for encounter. Pt here for initial Makena injection. Procedure explained and pt advised that injections will continue weekly until 36 weeks. Pt voiced understanding and tolerated injection well.

## 2018-02-28 IMAGING — US US OB TRANSVAGINAL
1 series · 13 of 28 positions shown · non-contrast
Comparison: None.

CLINICAL DATA: Six weeks 3 days pregnant by last menstrual with
pain for 2 days. Bleeding. Beta HCG greater than 3111.

EXAM:
OBSTETRIC <14 WK US AND TRANSVAGINAL OB US
TECHNIQUE: Both transabdominal and transvaginal ultrasound examinations were
performed for complete evaluation of the gestation as well as the
maternal uterus, adnexal regions, and pelvic cul-de-sac.
Transvaginal technique was performed to assess early pregnancy.

[Series 1: us ob transvaginal · 0.23mm/px · 51 acquisitions, 13 frames shown]
[im 2/51]
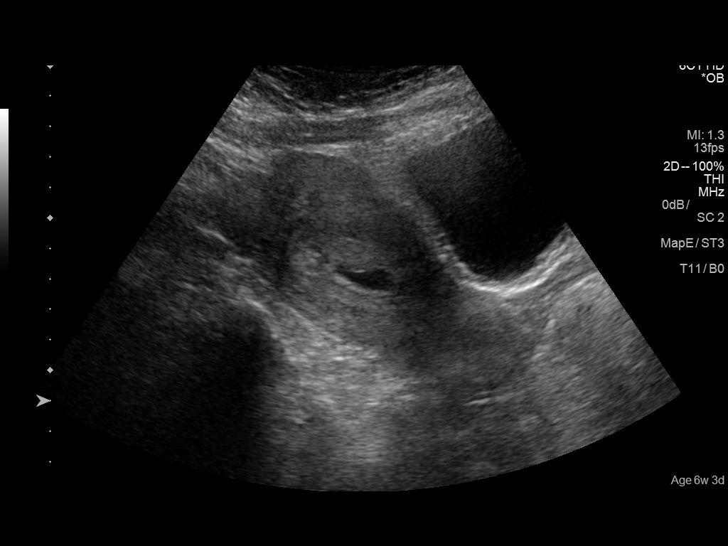
[im 6/51]
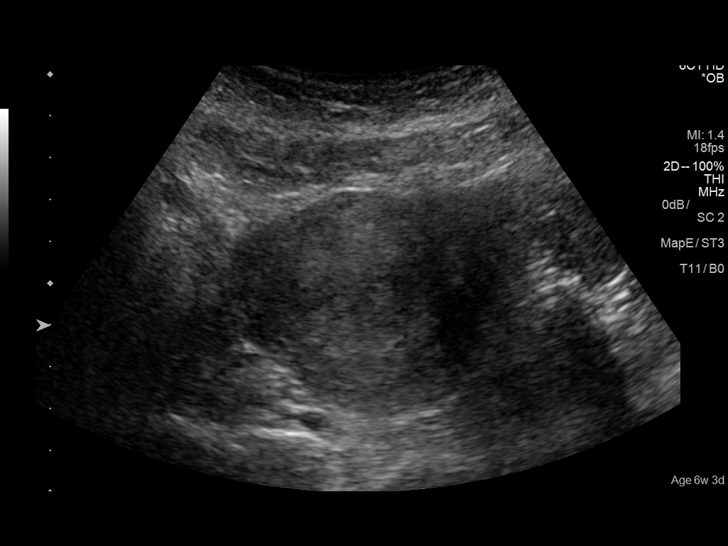
[im 10/51]
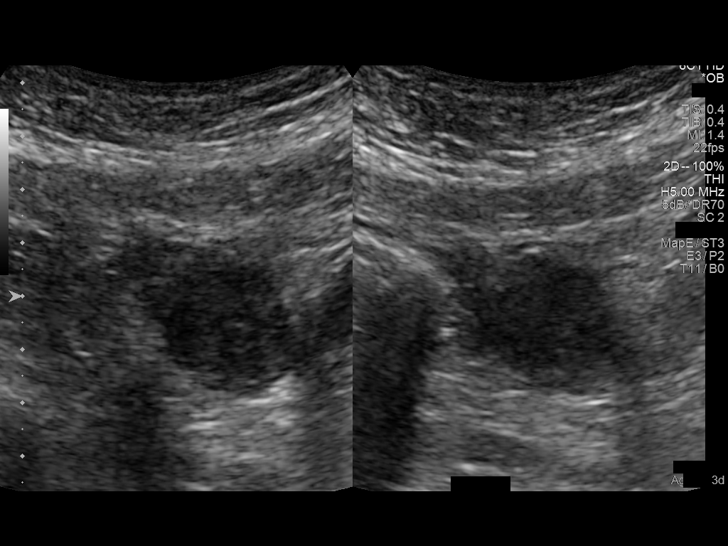
[im 13/51]
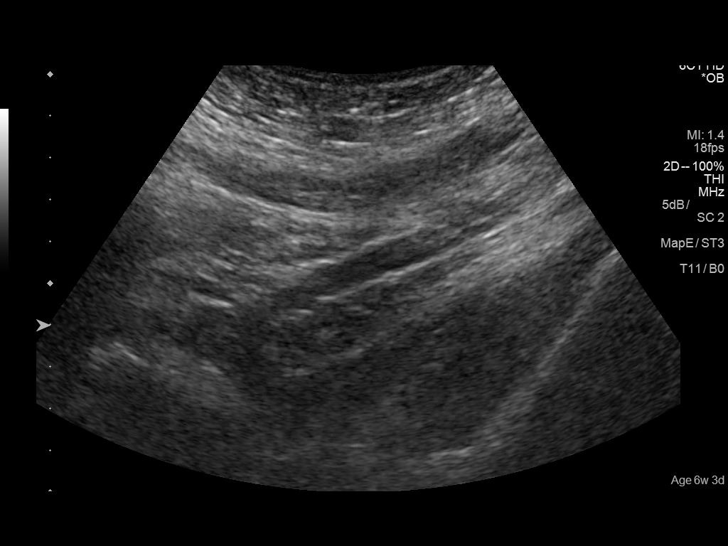
[im 17/51]
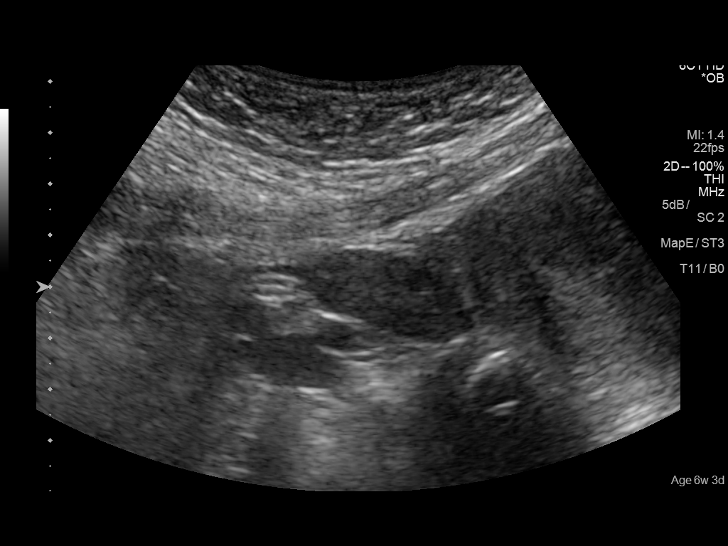
[im 21/51]
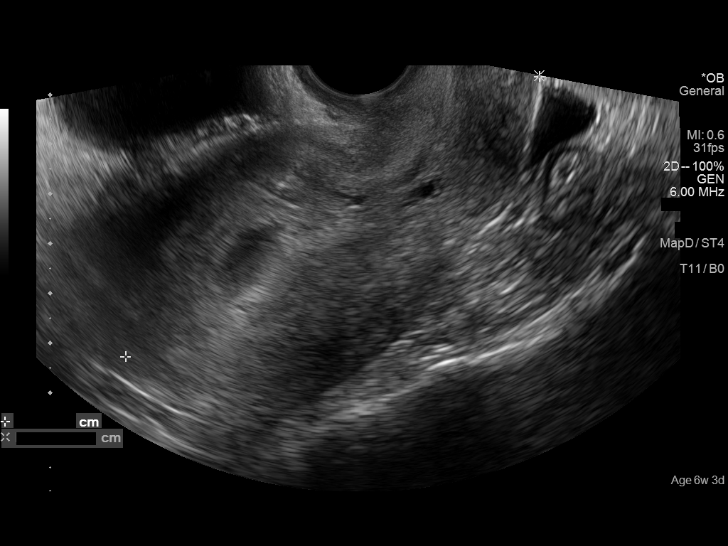
[im 26/51]
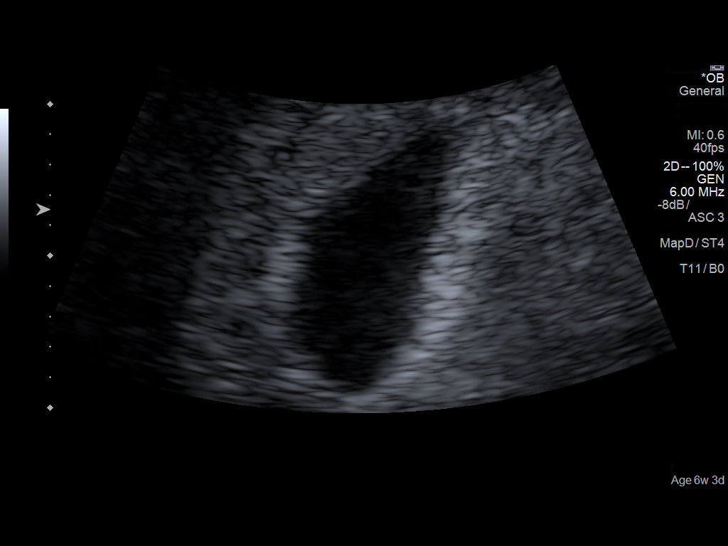
[im 30/51]
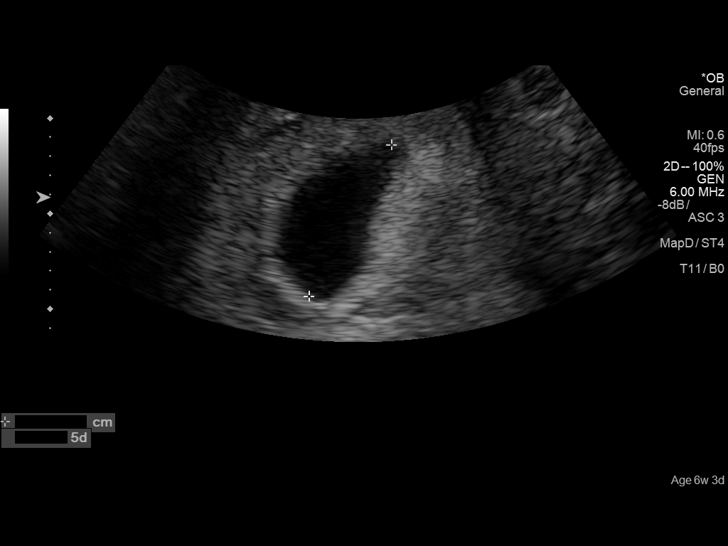
[im 34/51]
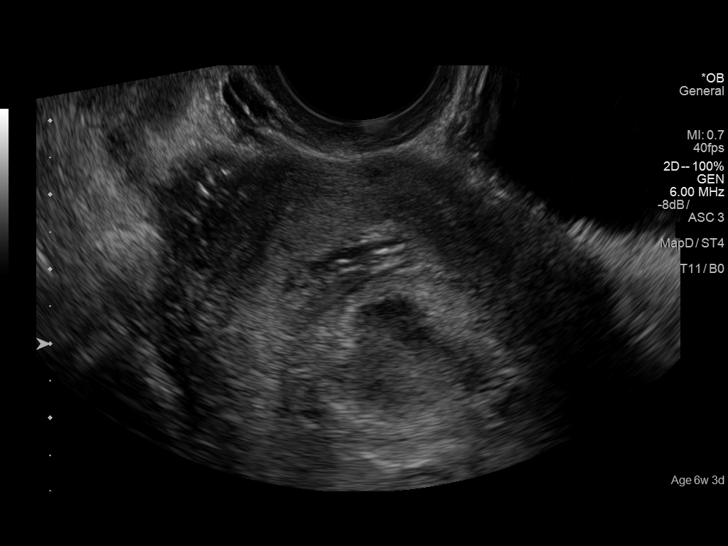
[im 38/51]
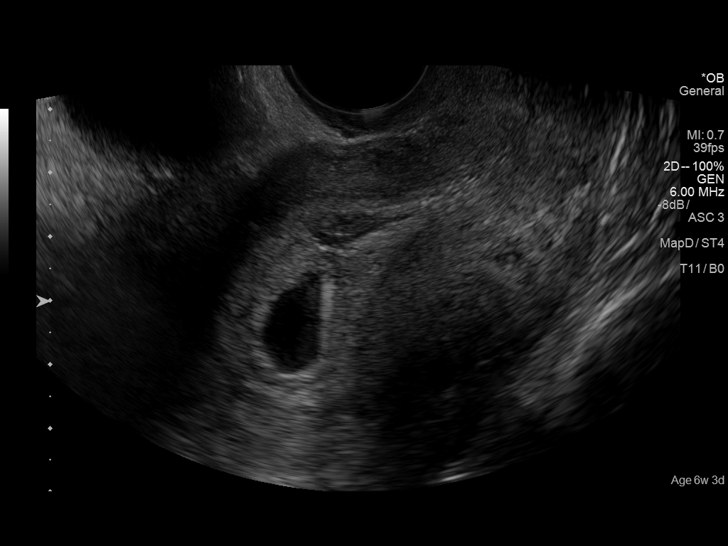
[im 41/51]
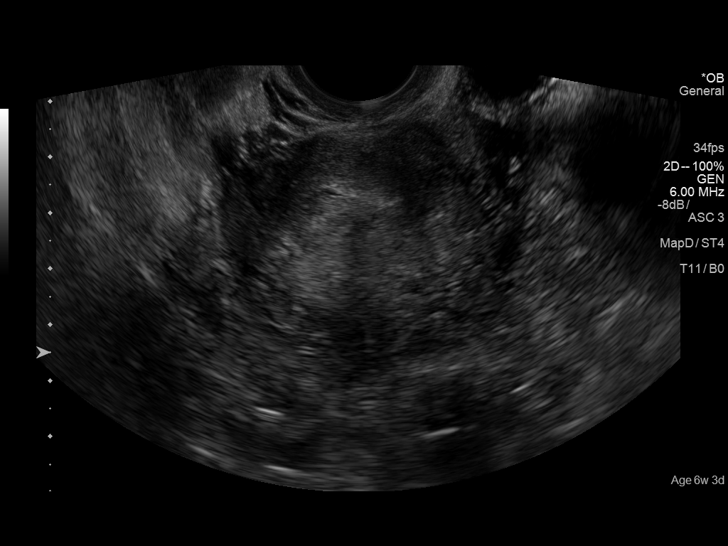
[im 45/51]
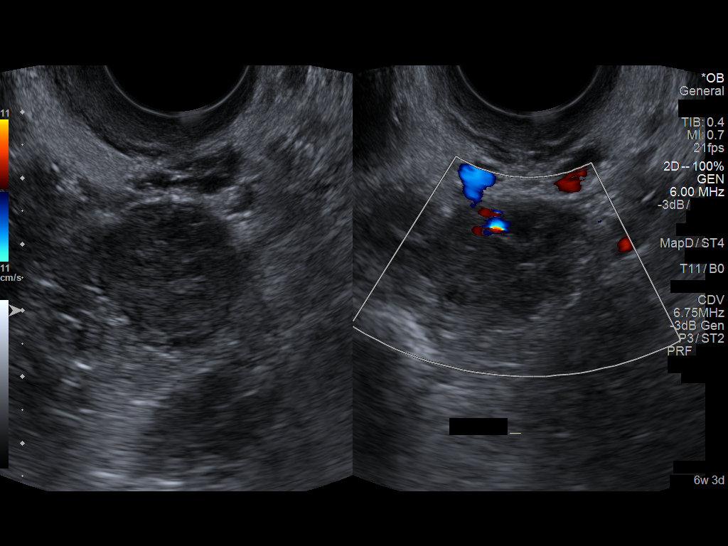
[im 49/51]
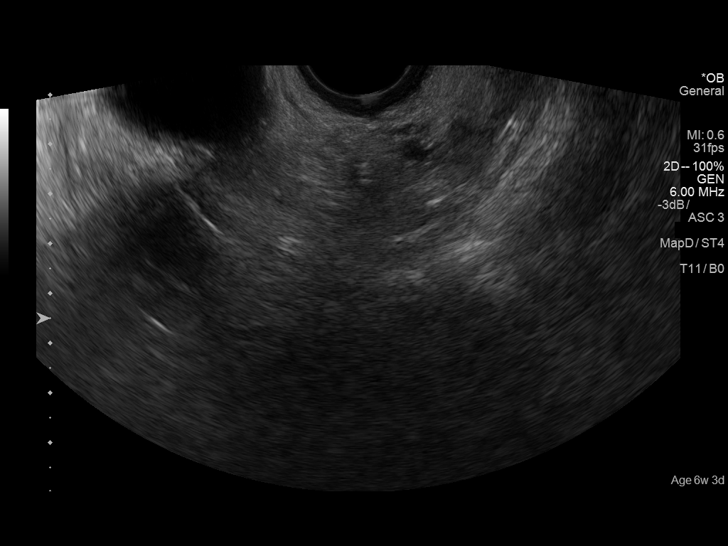

[13 of 28 positions shown; findings below may reference images not displayed]

FINDINGS: Intrauterine gestational sac: Visualized/normal in shape.

Yolk sac:  Absent

Embryo:  Absent

Cardiac Activity: Absent

MSD: 18  mm   6  w   5  d

US EDC: 04/18/2016

Subchorionic hemorrhage: Small volume subchronic hemorrhage
identified anteriorly, including on image 40.

Maternal uterus/adnexae: Normal appearance of the ovaries. Trace
free pelvic fluid is likely physiologic.
IMPRESSION: 1. Intrauterine gestational sac of 18 mm, corresponding to 6 weeks 5
days. Lack of visualization of fetal pole. Although this could be
due to early gestational age, given the sac diameter, this is
suspicious for a nonviable pregnancy. Consider short term ultrasound
and/or beta HCG follow-up.
2. Small volume subchronic hemorrhage.

## 2018-03-04 ENCOUNTER — Encounter: Payer: Self-pay | Admitting: *Deleted

## 2018-03-05 ENCOUNTER — Encounter (HOSPITAL_COMMUNITY): Payer: Self-pay

## 2018-03-07 ENCOUNTER — Other Ambulatory Visit (HOSPITAL_COMMUNITY): Payer: Self-pay | Admitting: *Deleted

## 2018-03-07 ENCOUNTER — Ambulatory Visit (HOSPITAL_COMMUNITY)
Admission: RE | Admit: 2018-03-07 | Discharge: 2018-03-07 | Disposition: A | Discharge status: Home / Self Care | Payer: Self-pay | Source: Ambulatory Visit | Attending: Family Medicine | Admitting: Family Medicine

## 2018-03-07 ENCOUNTER — Encounter (HOSPITAL_COMMUNITY): Payer: Self-pay

## 2018-03-07 ENCOUNTER — Ambulatory Visit (INDEPENDENT_AMBULATORY_CARE_PROVIDER_SITE_OTHER): Payer: Self-pay | Admitting: General Practice

## 2018-03-07 VITALS — BP 111/60 | HR 57 | Ht 65.0 in | Wt 199.0 lb

## 2018-03-07 DIAGNOSIS — Z8751 Personal history of pre-term labor: Secondary | ICD-10-CM

## 2018-03-07 DIAGNOSIS — Z363 Encounter for antenatal screening for malformations: Secondary | ICD-10-CM

## 2018-03-07 DIAGNOSIS — O09529 Supervision of elderly multigravida, unspecified trimester: Secondary | ICD-10-CM

## 2018-03-07 DIAGNOSIS — O09892 Supervision of other high risk pregnancies, second trimester: Secondary | ICD-10-CM | POA: Insufficient documentation

## 2018-03-07 DIAGNOSIS — O09212 Supervision of pregnancy with history of pre-term labor, second trimester: Secondary | ICD-10-CM

## 2018-03-07 DIAGNOSIS — O09522 Supervision of elderly multigravida, second trimester: Secondary | ICD-10-CM

## 2018-03-07 DIAGNOSIS — O0992 Supervision of high risk pregnancy, unspecified, second trimester: Secondary | ICD-10-CM

## 2018-03-07 DIAGNOSIS — O09292 Supervision of pregnancy with other poor reproductive or obstetric history, second trimester: Secondary | ICD-10-CM

## 2018-03-07 DIAGNOSIS — O99212 Obesity complicating pregnancy, second trimester: Secondary | ICD-10-CM

## 2018-03-07 DIAGNOSIS — Z3A15 15 weeks gestation of pregnancy: Secondary | ICD-10-CM

## 2018-03-07 DIAGNOSIS — O34219 Maternal care for unspecified type scar from previous cesarean delivery: Secondary | ICD-10-CM

## 2018-03-07 MED ORDER — HYDROXYPROGESTERONE CAPROATE 275 MG/1.1ML ~~LOC~~ SOAJ
275.0000 mg | SUBCUTANEOUS | Status: AC
  Administered 2018-03-07 – 2018-07-12 (×19): 275 mg via SUBCUTANEOUS

## 2018-03-07 NOTE — Progress Notes (Signed)
Ashlynn I Garcia-Diosdado here for 17-P  Injection.  Injection administered without complication. Patient will return in one week for next injection. Research officer, trade [employer], Mariel present.  Marylynn Pearson, RN 03/07/2018  10:01 AM

## 2018-03-11 IMAGING — US US MFM OB TRANSVAGINAL
1 series · 15 of 19 positions shown · non-contrast
Comparison: none

[Series 1: us mfm ob transvaginal · 19 acquisitions, 15 frames shown]
[im 1/19]
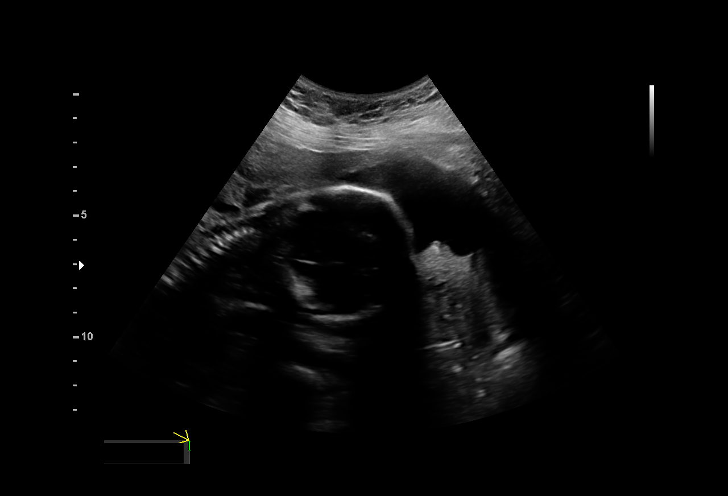
[im 2/19]
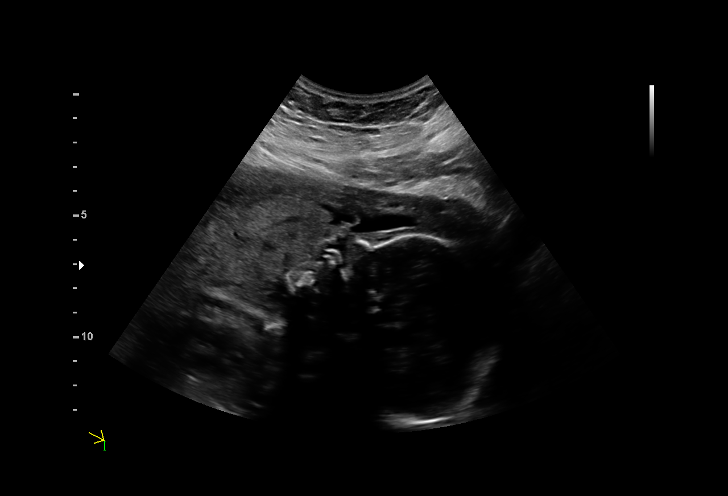
[im 4/19]
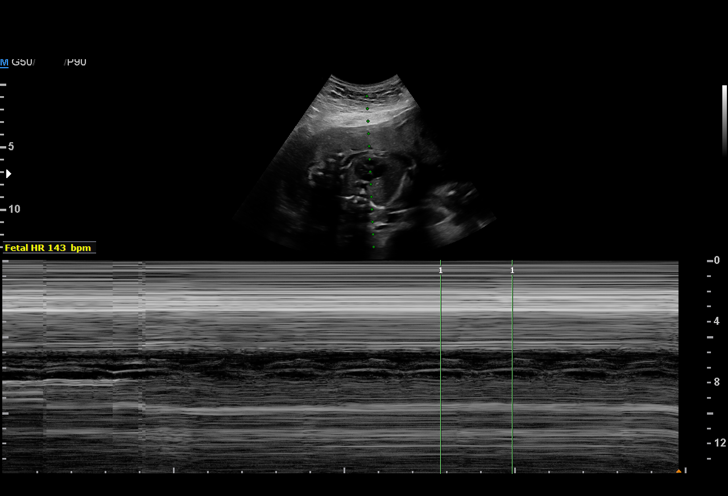
[im 5/19]
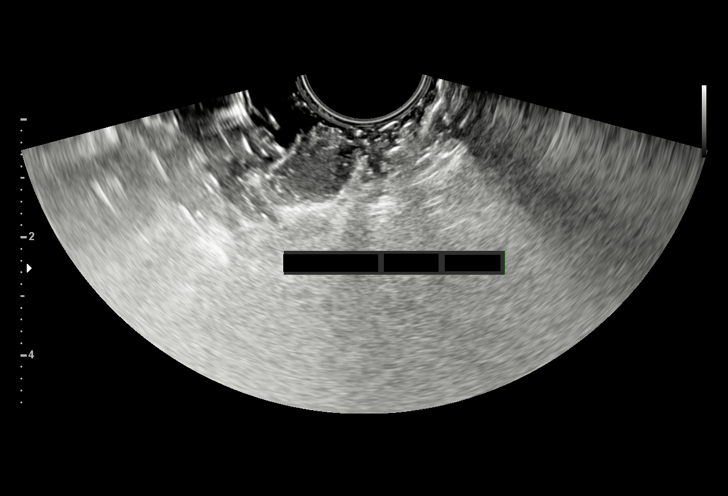
[im 6/19]
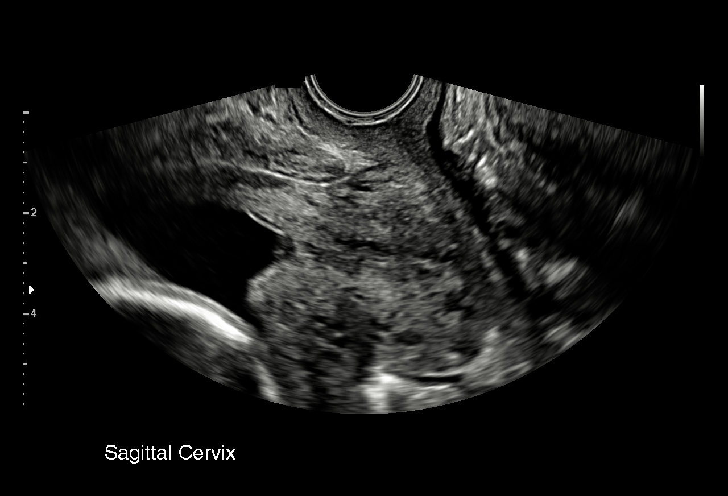
[im 7/19]
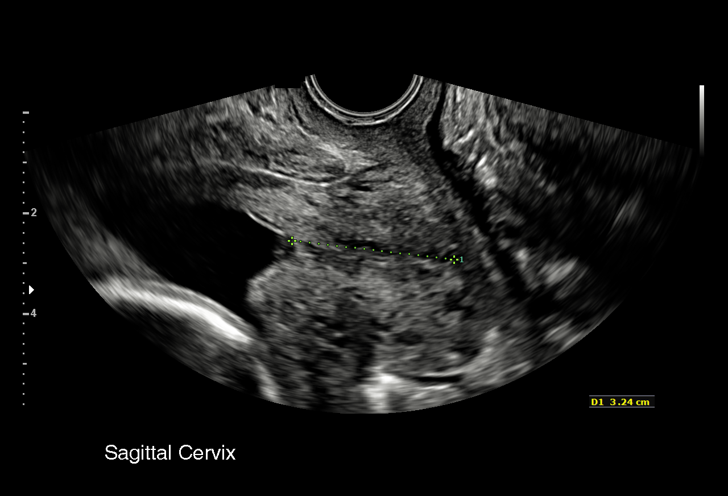
[im 9/19]
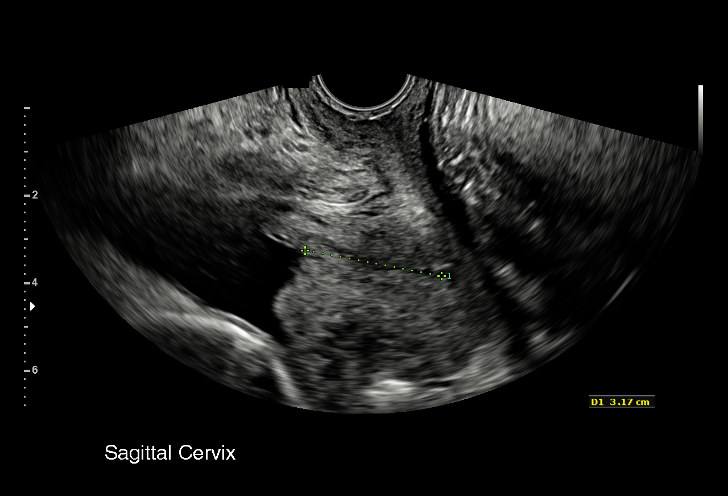
[im 10/19]
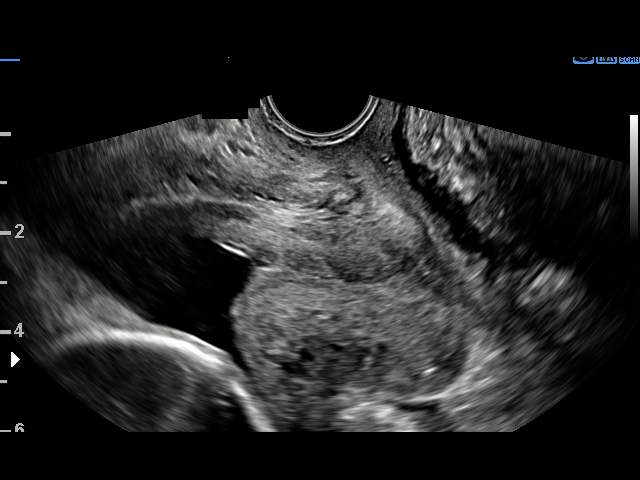
[im 11/19]
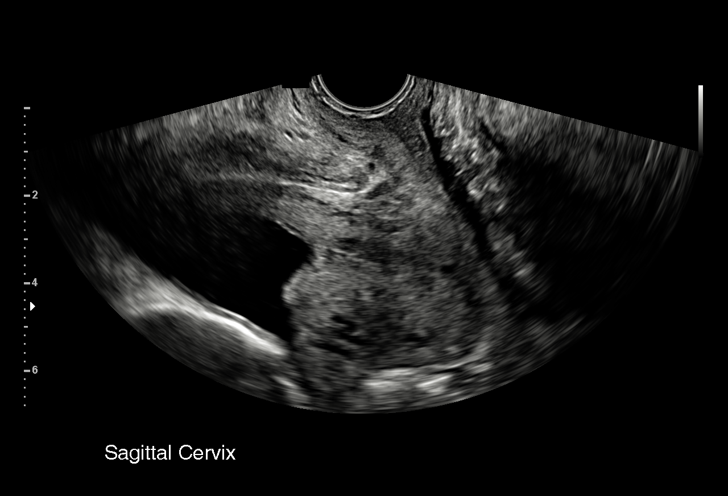
[im 13/19]
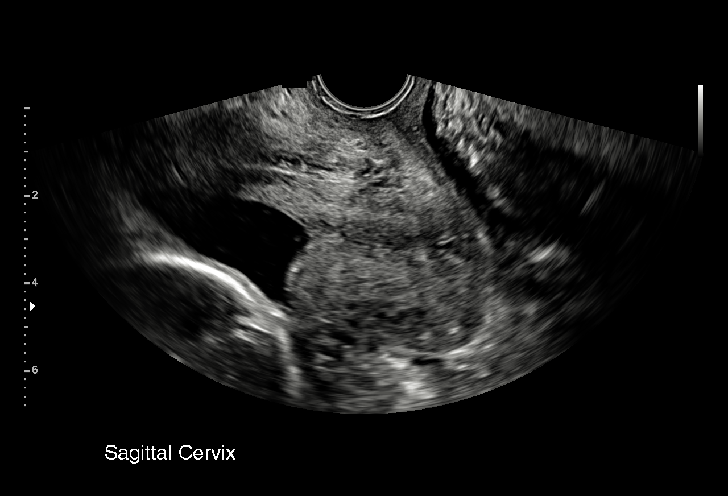
[im 14/19]
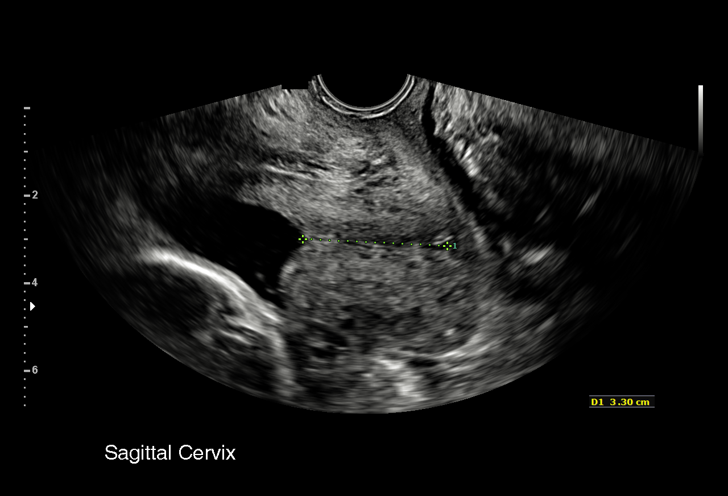
[im 15/19]
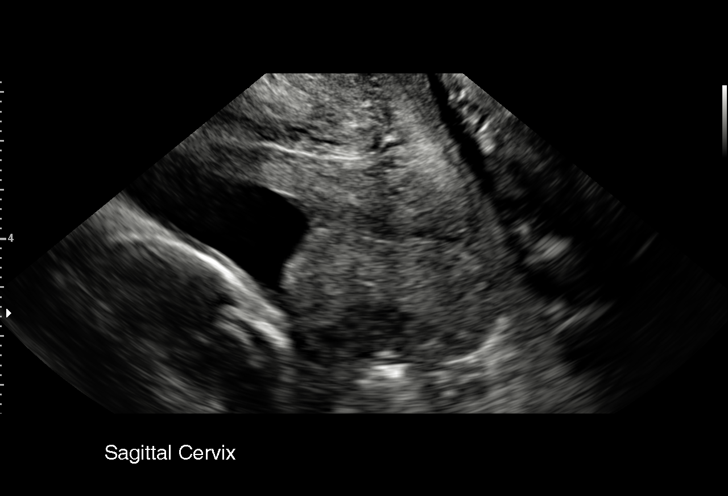
[im 16/19]
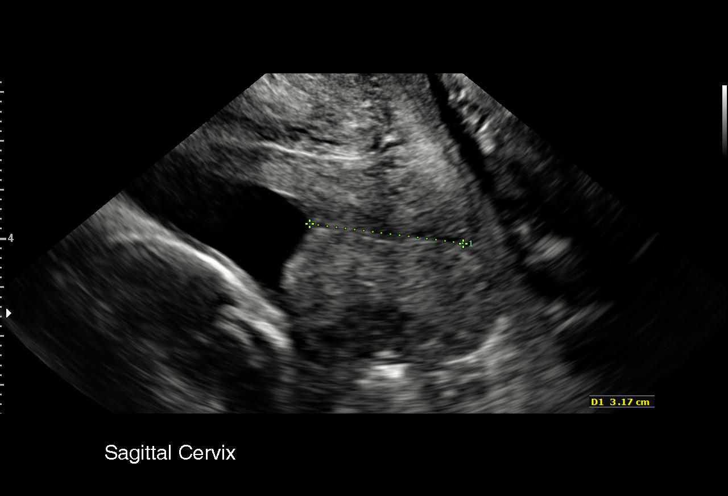
[im 18/19]
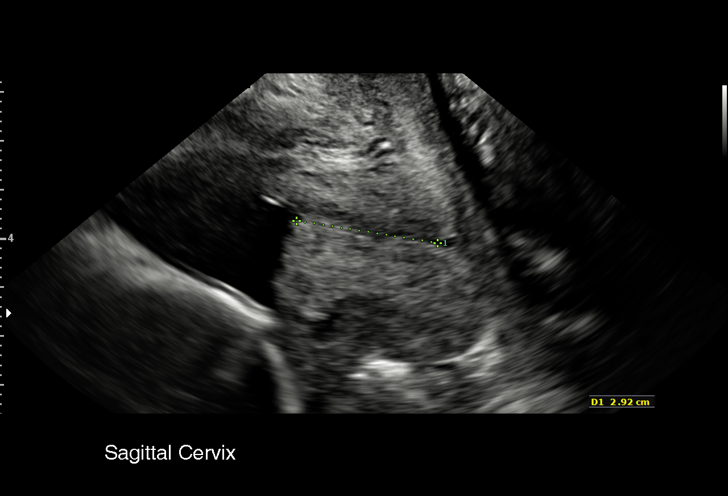
[im 19/19]
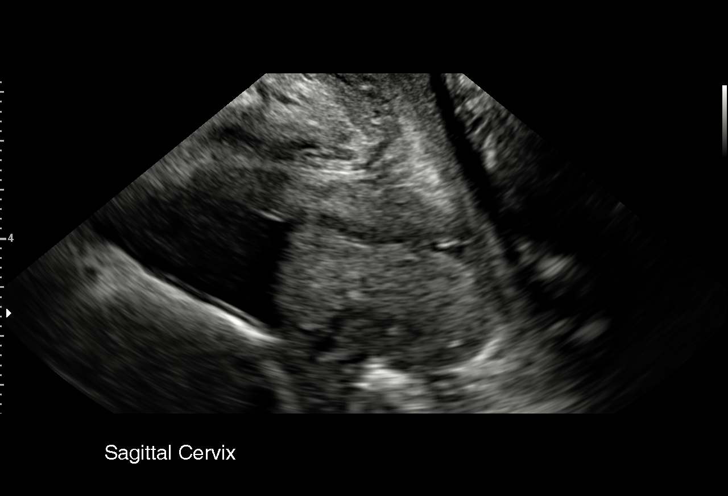

[15 of 19 positions shown; findings below may reference images not displayed]

[facility]

OB/Gyn Clinic
[REDACTED]

1  [facility]            [id number]      [id number]     [id number]
Indications

24 weeks gestation of pregnancy
Cervical incompetence, second trimester
History of cesarean delivery, currently
pregnant
Poor obstetric history: Previous preterm
delivery, antepartum (23w 6d) with neonatal
demise
OB History

Blood Type:            Height:  5'3"   Weight (lb):  183      BMI:
Gravidity:    3         Term:   1        Prem:   1        SAB:   0
TOP:          0       Ectopic:  0        Living: 1
Fetal Evaluation

Num Of Fetuses:     1
Fetal Heart         143
Rate(bpm):
Cardiac Activity:   Observed
Presentation:       Cephalic
Amniotic Fluid
AFI FV:      Subjectively within normal limits
Gestational Age

LMP:           24w 0d       Date:   03/22/16                 EDD:   12/27/16
Best:          24w 0d    Det. By:   LMP  (03/22/16)          EDD:   12/27/16
Cervix Uterus Adnexa

Cervix
Length:            2.9  cm.
Impression

Single IUP at 24w 0d with history of preterm delivery at 23-24
weeks
Currently on vaginal progesterone due to "borderline" cervical
shortening

TVUS - cervical length 2.9 cm without funneling
Recommendations

Continue vaginal progesterone
Recommend ultrasound for cervical length and growth in 2
weeks
If stable, would continue to follow clinically

## 2018-03-13 ENCOUNTER — Encounter: Payer: Self-pay | Admitting: Nurse Practitioner

## 2018-03-13 ENCOUNTER — Ambulatory Visit: Payer: Self-pay | Admitting: Clinical

## 2018-03-13 ENCOUNTER — Ambulatory Visit (INDEPENDENT_AMBULATORY_CARE_PROVIDER_SITE_OTHER): Payer: Self-pay | Admitting: Obstetrics and Gynecology

## 2018-03-13 VITALS — BP 112/63 | HR 67 | Wt 201.1 lb

## 2018-03-13 DIAGNOSIS — O09522 Supervision of elderly multigravida, second trimester: Secondary | ICD-10-CM

## 2018-03-13 DIAGNOSIS — Z789 Other specified health status: Secondary | ICD-10-CM

## 2018-03-13 DIAGNOSIS — Z8751 Personal history of pre-term labor: Secondary | ICD-10-CM

## 2018-03-13 DIAGNOSIS — O09529 Supervision of elderly multigravida, unspecified trimester: Secondary | ICD-10-CM | POA: Insufficient documentation

## 2018-03-13 DIAGNOSIS — O0992 Supervision of high risk pregnancy, unspecified, second trimester: Secondary | ICD-10-CM

## 2018-03-13 DIAGNOSIS — O3430 Maternal care for cervical incompetence, unspecified trimester: Secondary | ICD-10-CM

## 2018-03-13 MED ORDER — ASPIRIN EC 81 MG PO TBEC: 81.0000 mg | DELAYED_RELEASE_TABLET | Freq: Every day | ORAL | 10 refills | Status: DC

## 2018-03-13 NOTE — Progress Notes (Signed)
Prenatal Visit Note Date: 03/13/2018 Clinic: [facility]  Subjective:  Lindsey Wells is a 37 y.o. [phone] at [redacted]w[redacted]d being seen today for ongoing prenatal care.  She is currently monitored for the following issues for this high-risk pregnancy and has History of cesarean delivery; Language barrier; Supervision of high risk pregnancy, antepartum, second trimester; History of preterm labor/delivery ; History of Uterine cervical insufficiency during pregnancy; and AMA (advanced maternal age) multigravida 35+ on their problem list.  Patient reports no complaints.   Contractions: Not present. Vag. Bleeding: None.   . Denies leaking of fluid.   The following portions of the patient's history were reviewed and updated as appropriate: allergies, current medications, past family history, past medical history, past social history, past surgical history and problem list. Problem list updated.  Objective:   Vitals:   03/13/18 1309  BP: 112/63  Pulse: 67  Weight: 201 lb 1.6 oz (91.2 kg)    Fetal Status: Fetal Heart Rate (bpm): 153         General:  Alert, oriented and cooperative. Patient is in no acute distress.  Skin: Skin is warm and dry. No rash noted.   Cardiovascular: Normal heart rate noted  Respiratory: Normal respiratory effort, no problems with respiration noted  Abdomen: Soft, gravid, appropriate for gestational age. Pain/Pressure: Absent     Pelvic:  Cervical exam deferred        Extremities: Normal range of motion.  Edema: None  Mental Status: Normal mood and affect. Normal behavior. Normal judgment and thought content.   Urinalysis:      Assessment and Plan:  Pregnancy: [id number] at [redacted]w[redacted]d  1. Supervision of high risk pregnancy, antepartum, second trimester Routine care.  - Protein / creatinine ratio, urine  2. Language barrier Interpreter used  3. History of preterm labor/delivery  17p today. Continue qwk.   4. History of Uterine cervical  insufficiency during pregnancy Has rpt surveillance CL in early November with MFM. 1st one was 34mm. MFM recommend repeat 40m. Pt had slightly short CL last pregnancy and went to term with just vag progesterone   5. Multigravida of advanced maternal age in second trimester Recommend she start low dose ASA today. Pt amenable to this. Will get baseline pc ratio. cffdna neg  Preterm labor symptoms and general obstetric precautions including but not limited to vaginal bleeding, contractions, leaking of fluid and fetal movement were reviewed in detail with the patient. Please refer to After Visit Summary for other counseling recommendations.  Return in about 1 week (around 03/20/2018) for 17p. needs qwk 17p visits.   [place] Bing, MD

## 2018-03-13 NOTE — BH Specialist Note (Signed)
Integrated Behavioral Health Initial Visit  MRN: [id number] Name: Lindsey Wells  Number of Integrated Behavioral Health Clinician visits:: 1/6 Session Start time: 1:29  Session End time: 1:36 Total time: 15 minutes  Type of Service: Integrated Behavioral Health- Individual/Family Interpretor:Yes.   Interpretor Name and Language: Spanish    Warm Hand Off Completed.       SUBJECTIVE: Lindsey Wells is a 37 y.o. female accompanied by 3yo son Patient was referred by [place] Bing, MD for Initial OB introduction to integrated behavioral health services . Patient reports the following symptoms/concerns: Pt states no concerns today. Duration of problem: n/a; Severity of problem: n/a  OBJECTIVE: Mood: Normal and Affect: Appropriate Risk of harm to self or others: No plan to harm self or others  LIFE CONTEXT: Family and Social: - School/Work: - Self-Care: - Life Changes: Current pregnancy   GOALS ADDRESSED:n/a  INTERVENTIONS: Standardized Assessments completed: GAD-7 and PHQ 9  ASSESSMENT: Patient currently experiencing Supervision of high-risk  pregnancy, antepartum, second trimester.   Patient may benefit from Initial OB introduction to integrated behavioral health services .  PLAN: 1. Follow up with behavioral health clinician on : As needed 2. Behavioral recommendations:  -Continue taking prenatal vitamin, as recommended by medical provider 3. Referral(s): Integrated Behavioral Health Services (In Clinic) 4. "From scale of 1-10, how likely are you to follow plan?": 10  Rae Lips, LCSW  Depression screen [facility] 2/9 03/13/2018 02/13/2018 01/22/2017 12/05/2016 11/27/2016  Decreased Interest 0 0 0 0 0  Down, Depressed, Hopeless 0 0 0 0 0  PHQ - 2 Score 0 0 0 0 0  Altered sleeping 0 0 0 0 0  Tired, decreased energy 0 0 1 1 1   Change in appetite 0 0 1 1 0  Feeling bad or failure about yourself  0 0 1 0 0  Trouble concentrating 0 0 0 0 0   Moving slowly or fidgety/restless 0 0 0 0 0  Suicidal thoughts 0 0 0 0 0  PHQ-9 Score 0 0 3 2 1   Some recent data might be hidden   GAD 7 : Generalized Anxiety Score 02/13/2018 01/22/2017 12/05/2016 11/27/2016  Nervous, Anxious, on Edge 0 0 0 0  Control/stop worrying 0 0 0 0  Worry too much - different things 0 1 0 0  Trouble relaxing 0 0 0 0  Restless 0 0 0 0  Easily annoyed or irritable 1 1 1 2   Afraid - awful might happen 0 0 0 -  Total GAD 7 Score 1 2 1  -

## 2018-03-13 NOTE — Progress Notes (Signed)
Memorie I Garcia-Diosdado here for 17-P  Injection.  Injection administered without complication. Patient will return in one week for next injection.  Osvaldo Human, RN 03/13/2018  1:20 PM

## 2018-03-14 LAB — PROTEIN / CREATININE RATIO, URINE: CREATININE, UR: 43.7 mg/dL

## 2018-03-20 ENCOUNTER — Ambulatory Visit (INDEPENDENT_AMBULATORY_CARE_PROVIDER_SITE_OTHER): Payer: Self-pay | Admitting: *Deleted

## 2018-03-20 VITALS — BP 102/53 | HR 68 | Wt 199.1 lb

## 2018-03-20 DIAGNOSIS — O09299 Supervision of pregnancy with other poor reproductive or obstetric history, unspecified trimester: Secondary | ICD-10-CM

## 2018-03-20 DIAGNOSIS — O09212 Supervision of pregnancy with history of pre-term labor, second trimester: Secondary | ICD-10-CM

## 2018-03-21 ENCOUNTER — Encounter: Payer: Self-pay | Admitting: Obstetrics and Gynecology

## 2018-03-21 NOTE — Progress Notes (Signed)
EDC changed to 3/28 from 3/12 on 03/21/18 based on u/s from 03/07/2018.  Cornelia Copa MD Attending [facility] [employer] (Faculty Practice) 03/21/2018 Time: 1117am

## 2018-03-27 ENCOUNTER — Ambulatory Visit (INDEPENDENT_AMBULATORY_CARE_PROVIDER_SITE_OTHER): Payer: Self-pay

## 2018-03-27 VITALS — BP 117/64 | HR 70 | Wt 202.0 lb

## 2018-03-27 DIAGNOSIS — O09212 Supervision of pregnancy with history of pre-term labor, second trimester: Secondary | ICD-10-CM

## 2018-03-27 DIAGNOSIS — Z8751 Personal history of pre-term labor: Secondary | ICD-10-CM

## 2018-03-27 NOTE — Progress Notes (Signed)
Lindsey Wells here for 17-P  Injection.  Injection administered without complication. Patient will return in one week for next injection.  Pt notified that her due date changed to 08/24/18.  Pt informed that she was told in [place] that due date changed.    Ralene Bathe, RN 03/27/2018  2:59 PM

## 2018-03-27 NOTE — Progress Notes (Signed)
I have reviewed the chart and agree with nursing staff's documentation of this patient's encounter.  Jaynie Collins, MD 03/27/2018 4:56 PM

## 2018-04-01 ENCOUNTER — Encounter (HOSPITAL_COMMUNITY): Payer: Self-pay

## 2018-04-04 ENCOUNTER — Ambulatory Visit (INDEPENDENT_AMBULATORY_CARE_PROVIDER_SITE_OTHER): Payer: Self-pay | Admitting: Internal Medicine

## 2018-04-04 ENCOUNTER — Encounter (HOSPITAL_COMMUNITY): Payer: Self-pay

## 2018-04-04 ENCOUNTER — Ambulatory Visit (HOSPITAL_COMMUNITY)
Admission: RE | Admit: 2018-04-04 | Discharge: 2018-04-04 | Disposition: A | Discharge status: Home / Self Care | Payer: Self-pay | Source: Ambulatory Visit | Attending: Family Medicine | Admitting: Family Medicine

## 2018-04-04 ENCOUNTER — Other Ambulatory Visit (HOSPITAL_COMMUNITY): Payer: Self-pay | Admitting: *Deleted

## 2018-04-04 VITALS — BP 108/70 | HR 64 | Wt 201.4 lb

## 2018-04-04 DIAGNOSIS — O3430 Maternal care for cervical incompetence, unspecified trimester: Secondary | ICD-10-CM

## 2018-04-04 DIAGNOSIS — O99212 Obesity complicating pregnancy, second trimester: Secondary | ICD-10-CM

## 2018-04-04 DIAGNOSIS — Z8751 Personal history of pre-term labor: Secondary | ICD-10-CM

## 2018-04-04 DIAGNOSIS — O09529 Supervision of elderly multigravida, unspecified trimester: Secondary | ICD-10-CM

## 2018-04-04 DIAGNOSIS — O09522 Supervision of elderly multigravida, second trimester: Secondary | ICD-10-CM | POA: Insufficient documentation

## 2018-04-04 DIAGNOSIS — O09292 Supervision of pregnancy with other poor reproductive or obstetric history, second trimester: Secondary | ICD-10-CM

## 2018-04-04 DIAGNOSIS — Z3A19 19 weeks gestation of pregnancy: Secondary | ICD-10-CM | POA: Insufficient documentation

## 2018-04-04 DIAGNOSIS — O0992 Supervision of high risk pregnancy, unspecified, second trimester: Secondary | ICD-10-CM

## 2018-04-04 DIAGNOSIS — O09212 Supervision of pregnancy with history of pre-term labor, second trimester: Secondary | ICD-10-CM

## 2018-04-04 DIAGNOSIS — O34212 Maternal care for vertical scar from previous cesarean delivery: Secondary | ICD-10-CM

## 2018-04-04 NOTE — Progress Notes (Signed)
   PRENATAL VISIT NOTE  Subjective:  Lindsey Wells is a 37 y.o. [id number] at [redacted]w[redacted]d being seen today for ongoing prenatal care.  She is currently monitored for the following issues for this high-risk pregnancy and has History of cesarean delivery; Language barrier; Supervision of high risk pregnancy, antepartum, second trimester; History of preterm labor/delivery ; History of Uterine cervical insufficiency during pregnancy; and AMA (advanced maternal age) multigravida 35+ on their problem list.  Patient reports no complaints.  Contractions: Not present. Vag. Bleeding: None.  Movement: Present. Denies leaking of fluid.   The following portions of the patient's history were reviewed and updated as appropriate: allergies, current medications, past family history, past medical history, past social history, past surgical history and problem list. Problem list updated.  Objective:   Vitals:   04/04/18 0947  BP: 108/70  Pulse: 64  Weight: 201 lb 6.4 oz (91.4 kg)    Fetal Status: Fetal Heart Rate (bpm): 142   Movement: Present     General:  Alert, oriented and cooperative. Patient is in no acute distress.  Skin: Skin is warm and dry. No rash noted.   Cardiovascular: Normal heart rate noted  Respiratory: Normal respiratory effort, no problems with respiration noted  Abdomen: Soft, gravid, appropriate for gestational age.  Pain/Pressure: Absent     Pelvic: Cervical exam deferred        Extremities: Normal range of motion.  Edema: None  Mental Status: Normal mood and affect. Normal behavior. Normal judgment and thought content.   Assessment and Plan:  Pregnancy: [id number] at [redacted]w[redacted]d  1. Supervision of high risk pregnancy, antepartum, second trimester Anatomy scan completed today and normal.   2. History of preterm labor/delivery  17-P injection given today   3. History of Uterine cervical insufficiency during pregnancy TV ultrasounds q2 weeks through 24wk scheduled.  Plan for  weekly BPP beginning at 36 weeks and serial [place] q4 weeks for fetal growth.   Preterm labor symptoms and general obstetric precautions including but not limited to vaginal bleeding, contractions, leaking of fluid and fetal movement were reviewed in detail with the patient. Please refer to After Visit Summary for other counseling recommendations.  No follow-ups on file.  Future Appointments  Date Time Provider Department Center  04/11/2018  9:30 AM WOC-WOCA NURSE [facility]OC-[facility]A WOC  04/18/2018  8:00 AM WH-MFC [place] 3 WH-MFCUS MFC-US  04/18/2018  9:30 AM WOC-WOCA NURSE WOC-W[facility]A WOC  04/24/2018  9:45 AM WOC-WOCA NURSE WOC-WOCA WOC  05/02/2018  8:30 AM WH-MFC [place] 1 WH-MFCUS MFC-US  05/02/2018  9:35 AM  Bing, MD WOC-WOCA WOC    De Hollingshead, DO

## 2018-04-04 NOTE — Patient Instructions (Signed)
Segundo trimestre de embarazo (Second Trimester of Pregnancy) El segundo trimestre va desde la semana13 hasta la 28, desde el cuarto hasta el sexto mes, y suele ser el momento en el que mejor se siente. En general, las nuseas matutinas han disminuido o han desaparecido completamente. Tendr ms energa y podr aumentarle el apetito. El beb por nacer (feto) se desarrolla rpidamente. Hacia el final del sexto mes, el beb mide aproximadamente 9 pulgadas (23 cm) y pesa alrededor de 1 libras (700 g). Es probable que sienta al beb moverse (dar pataditas) entre las 18 y 20 semanas del embarazo. CUIDADOS EN EL HOGAR  No fume, no consuma hierbas ni beba alcohol. No tome frmacos que el mdico no haya autorizado.  No consuma ningn producto que contenga tabaco, lo que incluye cigarrillos, tabaco de mascar o cigarrillos electrnicos. Si necesita ayuda para dejar de fumar, consulte al mdico. Puede recibir asesoramiento u otro tipo de apoyo para dejar de fumar.  Tome los medicamentos solamente como se lo haya indicado el mdico. Algunos medicamentos son seguros para tomar durante el embarazo y otros no lo son.  Haga ejercicios solamente como se lo haya indicado el mdico. Interrumpa la actividad fsica si comienza a tener calambres.  Ingiera alimentos saludables de manera regular.  Use un sostn que le brinde buen soporte si sus mamas estn sensibles.  No se d baos de inmersin en agua caliente, baos turcos ni saunas.  Colquese el cinturn de seguridad cuando conduzca.  No coma carne cruda ni queso sin cocinar; evite el contacto con las bandejas sanitarias de los gatos y la tierra que estos animales usan.  Tome las vitaminas prenatales.  Tome entre 1500 y 2000mg de calcio diariamente comenzando en la semana20 del embarazo hasta el parto.  Pruebe tomar un medicamento que la ayude a defecar (un laxante suave) si el mdico lo autoriza. Consuma ms fibra, que se encuentra en las frutas y  verduras frescas y los cereales integrales. Beba suficiente lquido para mantener el pis (orina) claro o de color amarillo plido.  Dese baos de asiento con agua tibia para aliviar el dolor o las molestias causadas por las hemorroides. Use una crema para las hemorroides si el mdico la autoriza.  Si se le hinchan las venas (venas varicosas), use medias de descanso. Levante (eleve) los pies durante 15minutos, 3 o 4veces por da. Limite el consumo de sal en su dieta.  No levante objetos pesados, use zapatos de tacones bajos y sintese derecha.  Descanse con las piernas elevadas si tiene calambres o dolor de cintura.  Visite a su dentista si no lo ha hecho durante el embarazo. Use un cepillo de cerdas suaves para cepillarse los dientes. Psese el hilo dental con suavidad.  Puede seguir manteniendo relaciones sexuales, a menos que el mdico le indique lo contrario.  Concurra a los controles mdicos.  SOLICITE AYUDA SI:  Siente mareos.  Sufre calambres o presin leves en la parte baja del vientre (abdomen).  Sufre un dolor persistente en el abdomen.  Tiene malestar estomacal (nuseas), vmitos, o tiene deposiciones acuosas (diarrea).  Advierte un olor ftido que proviene de la vagina.  Siente dolor al orinar.  SOLICITE AYUDA DE INMEDIATO SI:  Tiene fiebre.  Tiene una prdida de lquido por la vagina.  Tiene sangrado o pequeas prdidas vaginales.  Siente dolor intenso o clicos en el abdomen.  Sube o baja de peso rpidamente.  Tiene dificultades para recuperar el aliento y siente dolor en el pecho.  Sbitamente se   le hinchan mucho el rostro, las manos, los tobillos, los pies o las piernas.  No ha sentido los movimientos del beb durante una hora.  Siente un dolor de cabeza intenso que no se alivia con medicamentos.  Su visin se modifica.  Esta informacin no tiene como fin reemplazar el consejo del mdico. Asegrese de hacerle al mdico cualquier pregunta que  tenga. Document Released: 01/15/2013 Document Revised: 06/05/2014 Document Reviewed: 07/16/2012 Elsevier Interactive Patient Education  2017 Elsevier Inc.  

## 2018-04-11 ENCOUNTER — Ambulatory Visit (INDEPENDENT_AMBULATORY_CARE_PROVIDER_SITE_OTHER): Payer: Self-pay | Admitting: General Practice

## 2018-04-11 VITALS — BP 111/68 | HR 74 | Ht 65.0 in | Wt 201.0 lb

## 2018-04-11 DIAGNOSIS — Z8751 Personal history of pre-term labor: Secondary | ICD-10-CM

## 2018-04-11 DIAGNOSIS — O3430 Maternal care for cervical incompetence, unspecified trimester: Secondary | ICD-10-CM

## 2018-04-11 DIAGNOSIS — O3432 Maternal care for cervical incompetence, second trimester: Secondary | ICD-10-CM

## 2018-04-11 NOTE — Progress Notes (Signed)
I reviewed the note and agree with the nursing assessment and plan.   Loeta Herst, CNM 08/24/2017 10:32 AM   

## 2018-04-11 NOTE — Progress Notes (Signed)
Shalaya I Garcia-Diosdado here for 17-P  Injection.  Injection administered without complication. Patient will return in one week for next injection.  Marylynn PearsonCarrie Velmer Broadfoot, RN 04/11/2018  9:51 AM

## 2018-04-18 ENCOUNTER — Ambulatory Visit (HOSPITAL_COMMUNITY): Admission: RE | Admit: 2018-04-18 | Payer: Self-pay | Source: Ambulatory Visit

## 2018-04-18 ENCOUNTER — Ambulatory Visit (INDEPENDENT_AMBULATORY_CARE_PROVIDER_SITE_OTHER): Payer: Self-pay | Admitting: *Deleted

## 2018-04-18 VITALS — BP 102/68 | HR 67 | Wt 203.2 lb

## 2018-04-18 DIAGNOSIS — O09899 Supervision of other high risk pregnancies, unspecified trimester: Secondary | ICD-10-CM

## 2018-04-18 DIAGNOSIS — O09212 Supervision of pregnancy with history of pre-term labor, second trimester: Secondary | ICD-10-CM

## 2018-04-18 DIAGNOSIS — O09219 Supervision of pregnancy with history of pre-term labor, unspecified trimester: Principal | ICD-10-CM

## 2018-04-18 NOTE — Progress Notes (Signed)
Lindsey Wells here for 17-P  Injection.  Injection administered without complication. Patient will return in one week for next injection.  [name] [facility],RN 04/18/2018  10:25 AM

## 2018-04-18 NOTE — Progress Notes (Signed)
Patient seen and assessed by nursing staff.  Agree with documentation and plan.  

## 2018-04-22 IMAGING — US US MFM OB FOLLOW-UP
1 series · 13 of 28 positions shown · non-contrast
Comparison: none

[facility]

OB/Gyn Clinic
[REDACTED]
Indications
30 weeks gestation of pregnancy
Cervical incompetence, second trimester
History of cesarean delivery, currently
pregnant
Poor obstetric history: Previous preterm
delivery, antepartum (23w 6d) with neonatal
demise
Obesity complicating pregnancy, second
trimester
Encounter for other antenatal screening
follow-up
OB History
Blood Type:            Height:  5'3"   Weight (lb):  183      BMI:
Gravidity:    3         Term:   1        Prem:   1        SAB:   0
TOP:          0       Ectopic:  0        Living: 1
Fetal Evaluation
Num Of Fetuses:     1
Fetal Heart         124
Rate(bpm):
Cardiac Activity:   Observed
Presentation:       Cephalic
Placenta:           Anterior, above cervical os
P. Cord Insertion:  Visualized
Amniotic Fluid
AFI FV:      Subjectively within normal limits
AFI Sum(cm)     %Tile       Largest Pocket(cm)
11.13           22
RUQ(cm)       RLQ(cm)       LUQ(cm)        LLQ(cm)
3.25
Biometry
BPD:      76.4  mm     G. Age:  30w 5d         59  %    CI:        72.19   %   70 - 86
FL/HC:      19.9   %   19.2 -
HC:      286.1  mm     G. Age:  31w 3d         56  %    HC/AC:      1.10       0.99 -
AC:      260.1  mm     G. Age:  30w 1d         50  %    FL/BPD:     74.3   %   71 - 87
FL:       56.8  mm     G. Age:  29w 6d         30  %    FL/AC:      21.8   %   20 - 24
HUM:      52.5  mm     G. Age:  30w 4d         64  %
Est. FW:    8564  gm      3 lb 6 oz     57  %
Gestational Age
LMP:           30w 0d       Date:   03/22/16                 EDD:   12/27/16
U/S Today:     30w 4d                                        EDD:   12/23/16
Best:          30w 0d    Det. By:   LMP  (03/22/16)          EDD:   12/27/16
Anatomy
Cranium:               Appears normal         Aortic Arch:            Previously seen
Cavum:                 Previously seen        Ductal Arch:            Appears normal
Ventricles:            Appears normal         Diaphragm:              Previously seen
Choroid Plexus:        Previously seen        Stomach:                Appears normal, left
sided
Cerebellum:            Previously seen        Abdomen:                Previously seen
Posterior Fossa:       Previously seen        Abdominal Wall:         Appears nml (cord
insert, abd wall)
Nuchal Fold:           Not applicable (>20    Cord Vessels:           Previously seen
wks GA)
Face:                  Orbits and profile     Kidneys:                Appear normal
previously seen
Lips:                  Previously seen        Bladder:                Appears normal
Thoracic:              Appears normal         Spine:                  Appears normal
Heart:                 Appears normal         Upper Extremities:      Previously seen
(4CH, axis, and situs
RVOT:                  Appears normal         Lower Extremities:      Previously seen
LVOT:                  Appears normal
Other:  Fetus appears to be a male. Nasal bone previously visualized.
Technically difficult due to maternal habitus.
Cervix Uterus Adnexa
Cervix
Not visualized (advanced GA >65wks)
Uterus
No abnormality visualized.
Left Ovary
No adnexal mass visualized.
Right Ovary
Cul De Sac:   No free fluid seen.
Adnexa:       No abnormality visualized.
Impression
INDICATION: 35 yr old [id number] at 96w6d with history of
preterm delivery for fetal growth and reevaluate fetal anatomy.

[Series 1: us mfm ob follow-up · 13 of 68 slices shown]
[im 3/68]
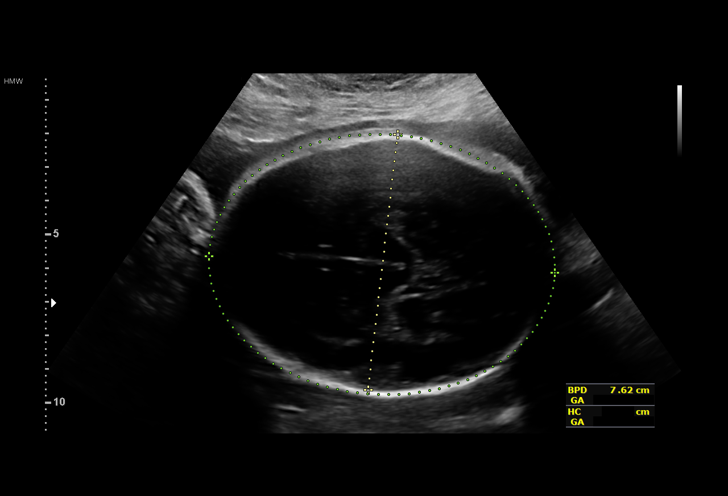
[im 8/68]
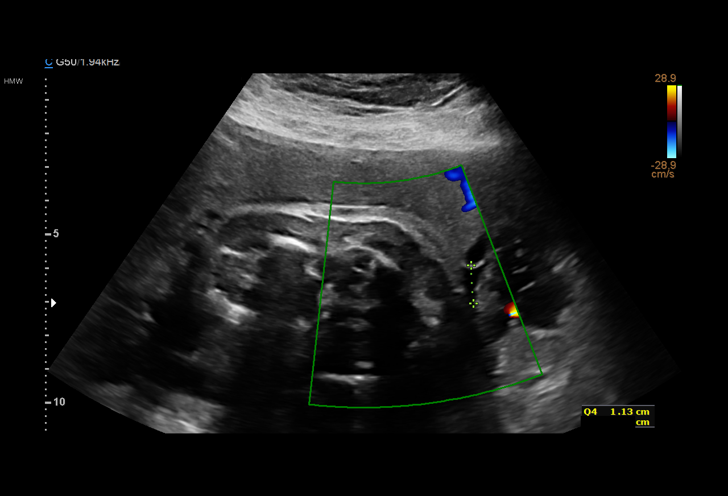
[im 13/68]
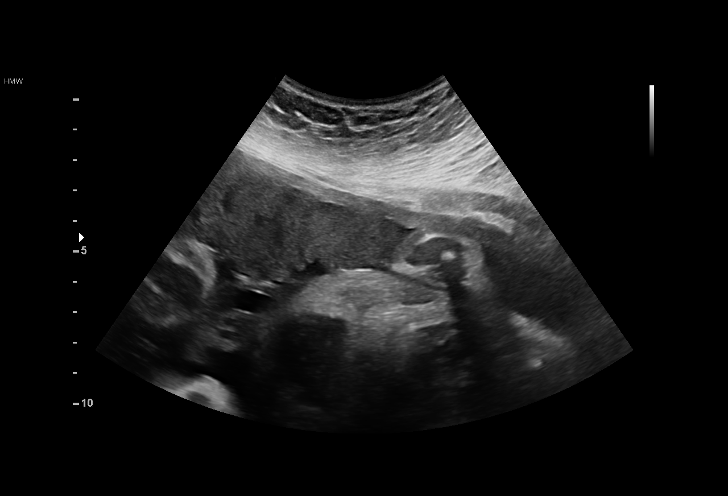
[im 18/68]
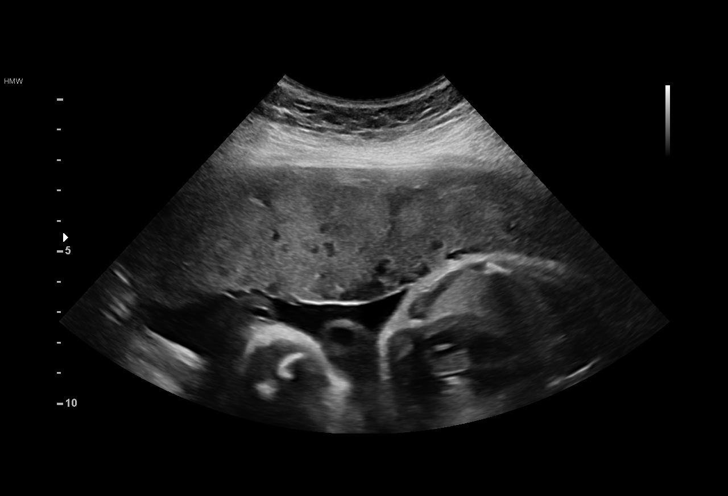
[im 23/68]
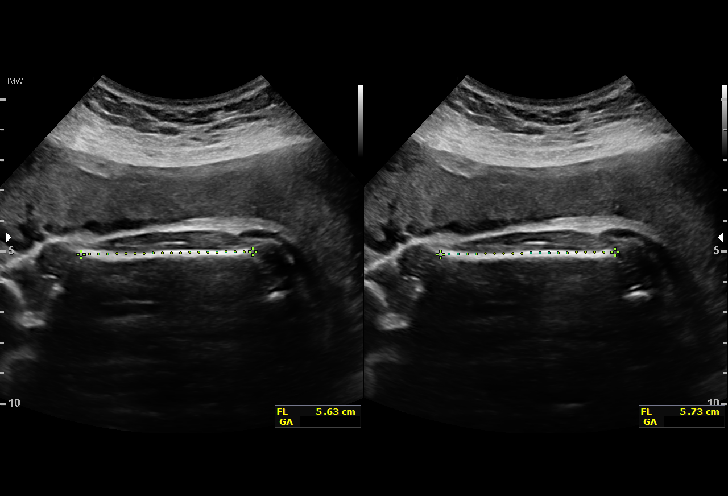
[im 28/68]
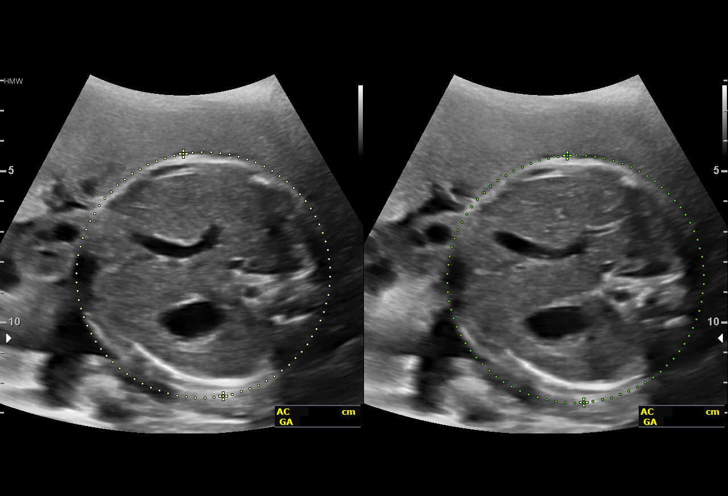
[im 35/68]
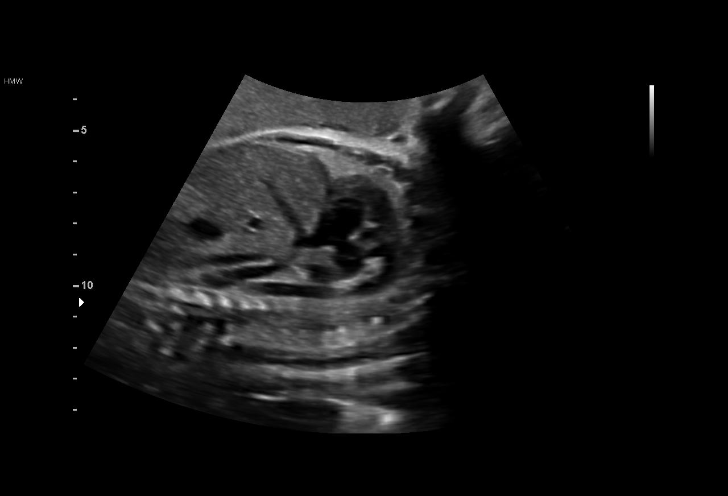
[im 40/68]
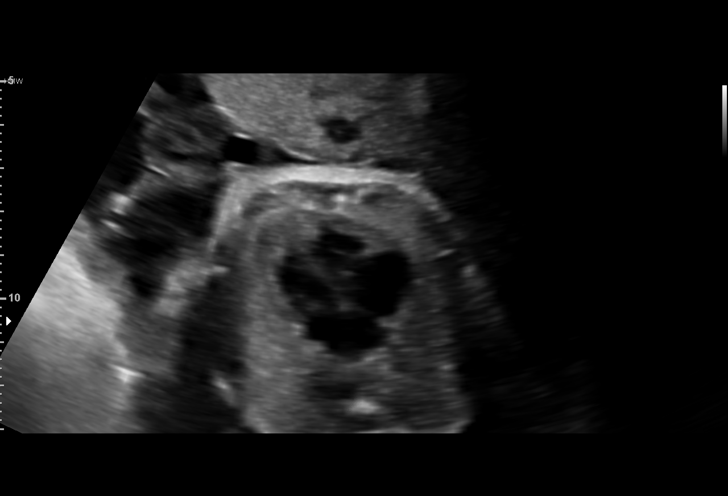
[im 45/68]
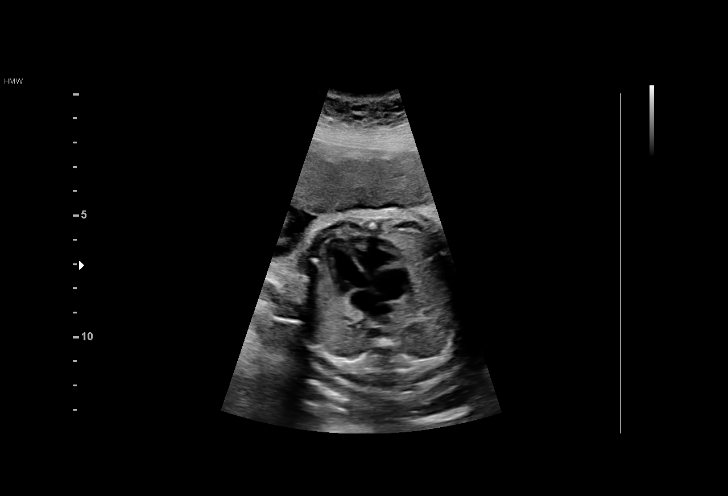
[im 50/68]
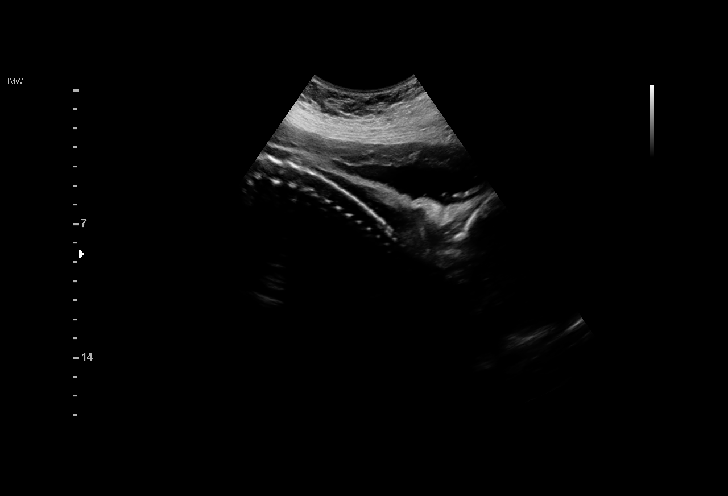
[im 55/68]
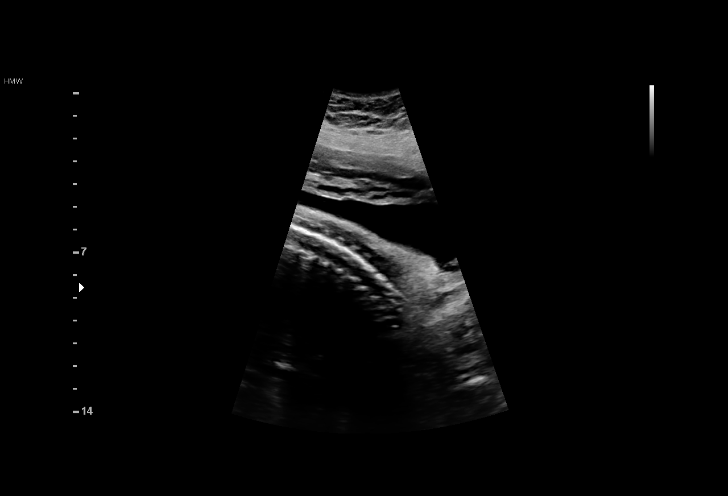
[im 60/68]
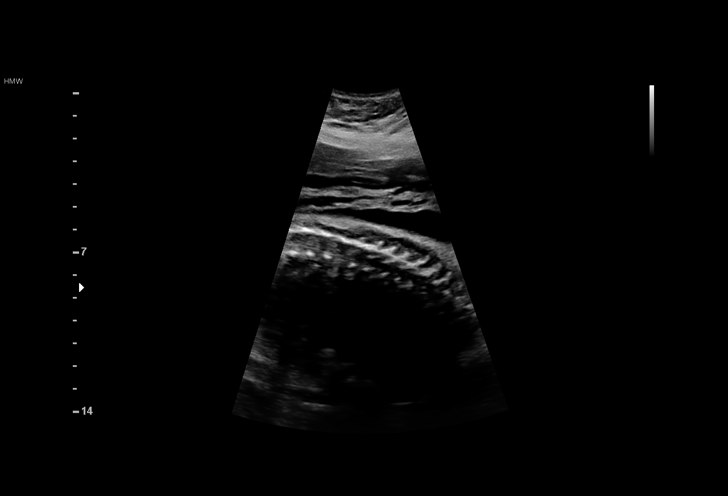
[im 65/68]
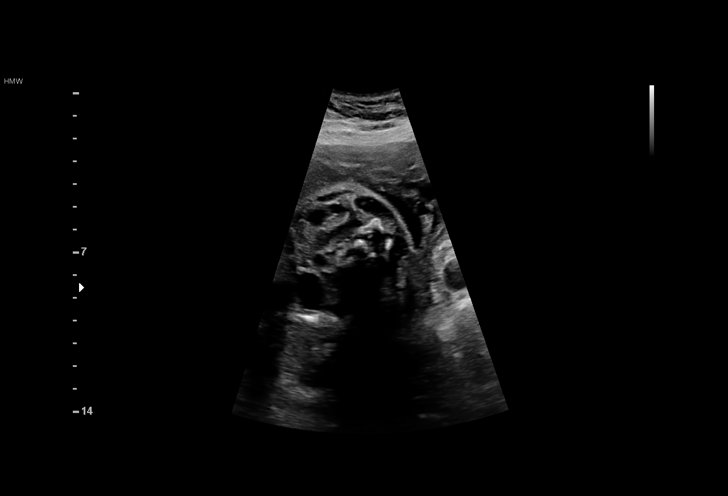

[13 of 28 positions shown; findings below may reference images not displayed]

FINDINGS: 1. Single intrauterine pregnancy.
2. Estimated fetal weight is in the 57th%.
3. Anterior placenta without evidence of previa.
4. Normal amniotic fluid index.
5. The limited anatomy survey is normal; any anatomy not
evaluated on today's exam was evaluated on the previous
exam.
Recommendations

1. Appropriate fetal growth.
2. Fetal anatomic survey is complete.
3. History of preterm delivery:
- previously counseled
- on vaginal progesterone- continue until 36 weeks
- recommend preterm labor precautions
4. Follow up ultrasounds as clinically indicated

## 2018-04-24 ENCOUNTER — Ambulatory Visit (INDEPENDENT_AMBULATORY_CARE_PROVIDER_SITE_OTHER): Payer: Self-pay | Admitting: *Deleted

## 2018-04-24 VITALS — BP 104/67 | HR 67

## 2018-04-24 DIAGNOSIS — O09219 Supervision of pregnancy with history of pre-term labor, unspecified trimester: Principal | ICD-10-CM

## 2018-04-24 DIAGNOSIS — O09212 Supervision of pregnancy with history of pre-term labor, second trimester: Secondary | ICD-10-CM

## 2018-04-24 DIAGNOSIS — O09899 Supervision of other high risk pregnancies, unspecified trimester: Secondary | ICD-10-CM

## 2018-04-24 NOTE — Progress Notes (Signed)
Lindsey Wells here for 17-P  Injection.  Injection administered without complication. Patient will return in one week for next injection.  [name] [facility],RN 04/24/2018  10:32 AM

## 2018-05-02 ENCOUNTER — Other Ambulatory Visit (HOSPITAL_COMMUNITY): Payer: Self-pay | Admitting: *Deleted

## 2018-05-02 ENCOUNTER — Ambulatory Visit (INDEPENDENT_AMBULATORY_CARE_PROVIDER_SITE_OTHER): Payer: Self-pay | Admitting: Obstetrics and Gynecology

## 2018-05-02 ENCOUNTER — Ambulatory Visit (HOSPITAL_COMMUNITY)
Admission: RE | Admit: 2018-05-02 | Discharge: 2018-05-02 | Disposition: A | Discharge status: Home / Self Care | Payer: Self-pay | Source: Ambulatory Visit | Attending: Internal Medicine | Admitting: Internal Medicine

## 2018-05-02 ENCOUNTER — Encounter (HOSPITAL_COMMUNITY): Payer: Self-pay

## 2018-05-02 ENCOUNTER — Other Ambulatory Visit (HOSPITAL_COMMUNITY): Payer: Self-pay | Admitting: Maternal and Fetal Medicine

## 2018-05-02 VITALS — BP 111/67 | HR 64 | Wt 203.0 lb

## 2018-05-02 DIAGNOSIS — Z3A23 23 weeks gestation of pregnancy: Secondary | ICD-10-CM | POA: Insufficient documentation

## 2018-05-02 DIAGNOSIS — O09522 Supervision of elderly multigravida, second trimester: Secondary | ICD-10-CM

## 2018-05-02 DIAGNOSIS — Z362 Encounter for other antenatal screening follow-up: Secondary | ICD-10-CM | POA: Insufficient documentation

## 2018-05-02 DIAGNOSIS — O09892 Supervision of other high risk pregnancies, second trimester: Secondary | ICD-10-CM | POA: Insufficient documentation

## 2018-05-02 DIAGNOSIS — O3430 Maternal care for cervical incompetence, unspecified trimester: Secondary | ICD-10-CM

## 2018-05-02 DIAGNOSIS — O34219 Maternal care for unspecified type scar from previous cesarean delivery: Secondary | ICD-10-CM | POA: Insufficient documentation

## 2018-05-02 DIAGNOSIS — O09292 Supervision of pregnancy with other poor reproductive or obstetric history, second trimester: Secondary | ICD-10-CM | POA: Insufficient documentation

## 2018-05-02 DIAGNOSIS — O0992 Supervision of high risk pregnancy, unspecified, second trimester: Secondary | ICD-10-CM

## 2018-05-02 DIAGNOSIS — O99212 Obesity complicating pregnancy, second trimester: Secondary | ICD-10-CM | POA: Insufficient documentation

## 2018-05-02 DIAGNOSIS — O09212 Supervision of pregnancy with history of pre-term labor, second trimester: Secondary | ICD-10-CM | POA: Insufficient documentation

## 2018-05-02 DIAGNOSIS — O34211 Maternal care for low transverse scar from previous cesarean delivery: Secondary | ICD-10-CM

## 2018-05-02 DIAGNOSIS — Z8751 Personal history of pre-term labor: Secondary | ICD-10-CM

## 2018-05-06 NOTE — Progress Notes (Signed)
Prenatal Visit Note Date: 05/02/2018 Clinic: [facility]  Subjective:  Eliezer Mccoyallely I Garcia-Diosdado is a 37 y.o. [phone] at 2755w5d being seen today for ongoing prenatal care.  She is currently monitored for the following issues for this high-risk pregnancy and has History of cesarean delivery; Language barrier; Supervision of high risk pregnancy, antepartum, second trimester; History of preterm labor/delivery ; History of Uterine cervical insufficiency during pregnancy; and AMA (advanced maternal age) multigravida 35+ on their problem list.  Patient reports no complaints.   Contractions: Not present. Vag. Bleeding: None.  Movement: Present. Denies leaking of fluid.   The following portions of the patient's history were reviewed and updated as appropriate: allergies, current medications, past family history, past medical history, past social history, past surgical history and problem list. Problem list updated.  Objective:   Vitals:   05/02/18 1007  BP: 111/67  Pulse: 64  Weight: 203 lb (92.1 kg)    Fetal Status: Fetal Heart Rate (bpm): 143   Movement: Present     General:  Alert, oriented and cooperative. Patient is in no acute distress.  Skin: Skin is warm and dry. No rash noted.   Cardiovascular: Normal heart rate noted  Respiratory: Normal respiratory effort, no problems with respiration noted  Abdomen: Soft, gravid, appropriate for gestational age. Pain/Pressure: Absent     Pelvic:  Cervical exam deferred        Extremities: Normal range of motion.  Edema: None  Mental Status: Normal mood and affect. Normal behavior. Normal judgment and thought content.   Urinalysis:      Assessment and Plan:  Pregnancy: [id number] at 6855w5d  Pregnancy: Routine care. 2h GTT nv  H/o PTB Continue with qwk 17p  H/o c-section Interested in tolac  Interpreter used Preterm labor symptoms and general obstetric precautions including but not limited to vaginal bleeding,  contractions, leaking of fluid and fetal movement were reviewed in detail with the patient. Please refer to After Visit Summary for other counseling recommendations.  Return in about 1 week (around 05/09/2018) for qwk visits for 17p. 3-4wk hrob with 2hr GTT.   [place] BingPickens, Mariaguadalupe Fialkowski, MD

## 2018-05-09 ENCOUNTER — Ambulatory Visit (INDEPENDENT_AMBULATORY_CARE_PROVIDER_SITE_OTHER): Payer: Self-pay

## 2018-05-09 VITALS — BP 116/46 | HR 61 | Wt 203.3 lb

## 2018-05-09 DIAGNOSIS — O09212 Supervision of pregnancy with history of pre-term labor, second trimester: Secondary | ICD-10-CM

## 2018-05-09 DIAGNOSIS — Z8751 Personal history of pre-term labor: Secondary | ICD-10-CM

## 2018-05-09 NOTE — Progress Notes (Signed)
I agree with the plan of care per the RN.  Venia Carbonasch, Jennifer I, NP 05/09/2018 2:20 PM

## 2018-05-09 NOTE — Progress Notes (Signed)
Lindsey Wells here for 17-P  Injection.  Injection administered without complication. Patient will return in one week for next injection.  Ralene BatheJeanetta Dare Spillman, RN 05/09/2018  10:25 AM

## 2018-05-16 ENCOUNTER — Ambulatory Visit (INDEPENDENT_AMBULATORY_CARE_PROVIDER_SITE_OTHER): Payer: Self-pay | Admitting: *Deleted

## 2018-05-16 VITALS — BP 128/56 | HR 72 | Wt 206.3 lb

## 2018-05-16 DIAGNOSIS — O09212 Supervision of pregnancy with history of pre-term labor, second trimester: Secondary | ICD-10-CM

## 2018-05-16 DIAGNOSIS — O09219 Supervision of pregnancy with history of pre-term labor, unspecified trimester: Principal | ICD-10-CM

## 2018-05-16 DIAGNOSIS — O09899 Supervision of other high risk pregnancies, unspecified trimester: Secondary | ICD-10-CM

## 2018-05-16 NOTE — Progress Notes (Signed)
Lindsey Wells here for 17-P  Injection.  Injection administered without complication. Patient will return in one week for next injection.  [name],RN 05/16/2018  10:22 AM

## 2018-05-17 NOTE — Progress Notes (Signed)
I have reviewed this chart and agree with the RN/CMA assessment and management.    Bralyn Folkert C Adiah Guereca, MD, FACOG Attending Physician, Faculty Practice [facility]  

## 2018-05-23 ENCOUNTER — Ambulatory Visit (INDEPENDENT_AMBULATORY_CARE_PROVIDER_SITE_OTHER): Payer: Self-pay

## 2018-05-23 VITALS — BP 116/60 | HR 72 | Wt 207.3 lb

## 2018-05-23 DIAGNOSIS — O09899 Supervision of other high risk pregnancies, unspecified trimester: Secondary | ICD-10-CM

## 2018-05-23 DIAGNOSIS — O09219 Supervision of pregnancy with history of pre-term labor, unspecified trimester: Secondary | ICD-10-CM

## 2018-05-23 NOTE — Progress Notes (Signed)
Chart reviewed for nurse visit. Agree with plan of care.   Kooistra, Kathryn Lorraine, CNM 05/23/2018 7:57 PM   

## 2018-05-23 NOTE — Progress Notes (Addendum)
Margel I Garcia-Diosdado here for 17-P  Injection.  Injection administered without complication. Patient will return in one week for next injection.  Ralene BatheJeanetta Lucie Friedlander, RN 05/23/2018  6:43 PM

## 2018-05-30 ENCOUNTER — Other Ambulatory Visit: Payer: Self-pay | Admitting: *Deleted

## 2018-05-30 ENCOUNTER — Ambulatory Visit (INDEPENDENT_AMBULATORY_CARE_PROVIDER_SITE_OTHER): Payer: Self-pay | Admitting: Obstetrics and Gynecology

## 2018-05-30 ENCOUNTER — Encounter: Payer: Self-pay | Admitting: Obstetrics and Gynecology

## 2018-05-30 ENCOUNTER — Other Ambulatory Visit: Payer: Self-pay

## 2018-05-30 VITALS — BP 115/70 | HR 64 | Wt 211.5 lb

## 2018-05-30 DIAGNOSIS — Z8751 Personal history of pre-term labor: Secondary | ICD-10-CM

## 2018-05-30 DIAGNOSIS — O09522 Supervision of elderly multigravida, second trimester: Secondary | ICD-10-CM

## 2018-05-30 DIAGNOSIS — O0992 Supervision of high risk pregnancy, unspecified, second trimester: Secondary | ICD-10-CM

## 2018-05-30 DIAGNOSIS — Z98891 History of uterine scar from previous surgery: Secondary | ICD-10-CM

## 2018-05-30 MED ORDER — HYDROXYPROGESTERONE CAPROATE 275 MG/1.1ML ~~LOC~~ SOAJ: 275.0000 mg | Freq: Once | SUBCUTANEOUS | Status: DC

## 2018-05-30 NOTE — Progress Notes (Signed)
Subjective:  Lindsey Wells is a 38 y.o. [id number] at [redacted]w[redacted]d being seen today for ongoing prenatal care.  She is currently monitored for the following issues for this high-risk pregnancy and has History of cesarean delivery; Language barrier; Supervision of high risk pregnancy, antepartum, second trimester; History of preterm labor/delivery ; History of Uterine cervical insufficiency during pregnancy; and AMA (advanced maternal age) multigravida 35+ on their problem list.  Patient reports no complaints.  Contractions: Not present. Vag. Bleeding: None.  Movement: Present. Denies leaking of fluid.   The following portions of the patient's history were reviewed and updated as appropriate: allergies, current medications, past family history, past medical history, past social history, past surgical history and problem list. Problem list updated.  Objective:   Vitals:   05/30/18 0901  BP: 115/70  Pulse: 64  Weight: 211 lb 8 oz (95.9 kg)    Fetal Status: Fetal Heart Rate (bpm): 135   Movement: Present     General:  Alert, oriented and cooperative. Patient is in no acute distress.  Skin: Skin is warm and dry. No rash noted.   Cardiovascular: Normal heart rate noted  Respiratory: Normal respiratory effort, no problems with respiration noted  Abdomen: Soft, gravid, appropriate for gestational age. Pain/Pressure: Absent     Pelvic:  Cervical exam deferred        Extremities: Normal range of motion.  Edema: Trace  Mental Status: Normal mood and affect. Normal behavior. Normal judgment and thought content.   Urinalysis:      Assessment and Plan:  Pregnancy: [id number] at [redacted]w[redacted]d  1. History of preterm labor/delivery  Stable Continue with weekly 17 OHP - HYDROXYprogesterone Caproate SOAJ 275 mg  2. Supervision of high risk pregnancy, antepartum, second trimester Stable 28 week labs on Monday Growth scan tomorrow  3. History of cesarean delivery Still undecided about TOLAC or  RLTCS  4. Multigravida of advanced maternal age in second trimester Low risks NIPS  Preterm labor symptoms and general obstetric precautions including but not limited to vaginal bleeding, contractions, leaking of fluid and fetal movement were reviewed in detail with the patient. Please refer to After Visit Summary for other counseling recommendations.  Return in about 2 weeks (around 06/13/2018) for OB visit.   Hermina Staggers, MD

## 2018-05-30 NOTE — Patient Instructions (Signed)
Tercer trimestre de embarazo Third Trimester of Pregnancy  El tercer trimestre comprende desde la semana28 hasta la semana40 (desde el mes7 hasta el mes9). En este trimestre, el beb en gestacin (feto) crece muy rpidamente. Hacia el final del noveno mes, el beb en gestacin mide alrededor de 20pulgadas (45cm) de largo. Pesa entre 6y 10libras (2,70y 4,50kg). Siga estas indicaciones en su casa: Medicamentos  Tome los medicamentos de venta libre y los recetados solamente como se lo haya indicado el mdico. Algunos medicamentos son seguros para tomar durante el embarazo y otros no lo son.  Tome vitaminas prenatales que contengan por lo menos 600microgramos (?g) de cido flico.  Si tiene dificultad para mover el intestino (estreimiento), tome un medicamento para ablandar las heces (laxante) si su mdico se lo autoriza. Comida y bebida   Ingiera alimentos saludables de manera regular.  No coma carne cruda ni quesos sin cocinar.  Si obtiene poca cantidad de calcio de los alimentos que ingiere, consulte a su mdico sobre la posibilidad de tomar un suplemento diario de calcio.  La ingesta diaria de cuatro o cinco comidas pequeas en lugar de tres comidas abundantes.  Evite el consumo de alimentos ricos en grasas y azcares, como los alimentos fritos y los dulces.  Para evitar el estreimiento: ? Consuma alimentos ricos en fibra, como frutas y verduras frescas, cereales integrales y frijoles. ? Beba suficiente lquido para mantener el pis (orina) claro o de color amarillo plido. Actividad  Haga ejercicios solamente como se lo haya indicado el mdico. Interrumpa la actividad fsica si comienza a tener calambres.  No levante objetos pesados, use zapatos de tacones bajos y sintese derecha.  No haga ejercicio si hace demasiado calor, hay demasiada humedad o se encuentra en un lugar de mucha altura (altitud alta).  Puede continuar teniendo relaciones sexuales, a menos que el  mdico le indique lo contrario. Alivio del dolor y del malestar  Use un sostn que le brinde buen soporte si sus mamas estn sensibles.  Haga pausas frecuentes y descanse con las piernas levantadas si tiene calambres en las piernas o dolor en la zona lumbar.  Dese baos de asiento con agua tibia para aliviar el dolor o las molestias causadas por las hemorroides. Use una crema para las hemorroides si el mdico la autoriza.  Si desarrolla venas hinchadas y abultadas (vrices) en las piernas: ? Use medias de compresin o medias de descanso como se lo haya indicado el mdico. ? Levante (eleve) los pies durante 15minutos, 3 o 4veces por da. ? Limite el consumo de sal en sus alimentos. Seguridad  Colquese el cinturn de seguridad cuando conduzca.  Haga una lista de los nmeros de telfono de emergencia, que incluya los nmeros de telfono de familiares, amigos, el hospital, as como los departamentos de polica y bomberos. Preparacin para la llegada del beb Para prepararse para la llegada de su beb:  Tome clases prenatales.  Practique ir manejando al hospital.  Visite el hospital y recorra el rea de maternidad.  Hable en su trabajo acerca de tomar licencia cuando llegue el beb.  Prepare el bolso que llevar al hospital.  Prepare la habitacin del beb.  Concurra a los controles mdicos.  Compre un asiento de seguridad orientado hacia atrs para llevar al beb en el automvil. Aprenda cmo instalarlo en el auto. Instrucciones generales  No se d baos de inmersin en agua caliente, baos turcos ni saunas.  No consuma ningn producto que contenga nicotina o tabaco, como cigarrillos y cigarrillos   electrnicos. Si necesita ayuda para dejar de fumar, consulte al mdico.  No beba alcohol.  No se haga duchas vaginales ni use tampones o toallas higinicas perfumadas.  No mantenga las piernas cruzadas durante mucho tiempo.  No haga viajes de larga distancia, excepto si es  obligatorio. Hgalos solamente si su mdico la autoriza.  Visite a su dentista si no lo ha hecho durante el embarazo. Use un cepillo de cerdas suaves para cepillarse los dientes. Psese el hilo dental con suavidad.  Evite el contacto con las bandejas sanitarias de los gatos y la tierra que estos animales usan. Estos elementos contienen bacterias que pueden causar defectos congnitos al beb y la posible prdida del beb (aborto espontneo) o la muerte fetal.  Concurra a todas las visitas prenatales como se lo haya indicado el mdico. Esto es importante. Comunquese con un mdico si:  No est segura de si est en trabajo de parto o si ha roto la bolsa de las aguas.  Tiene mareos.  Tiene clicos leves o siente presin en la parte baja del vientre.  Sufre un dolor persistente en el abdomen.  Sigue teniendo malestar estomacal, vomita o tiene heces lquidas.  Advierte un lquido con olor ftido que proviene de la vagina.  Siente dolor al orinar. Solicite ayuda de inmediato si:  Tiene fiebre.  Tiene una prdida de lquido por la vagina.  Tiene sangrado o pequeas prdidas vaginales.  Siente dolor intenso o clicos en el abdomen.  Aumenta o baja de peso rpidamente.  Tiene dificultades para recuperar el aliento y siente dolor en el pecho.  Sbitamente se le hinchan mucho el rostro, las manos, los tobillos, los pies o las piernas.  No ha sentido los movimientos del beb durante una hora.  Siente un dolor de cabeza intenso que no se alivia con medicamentos.  Tiene dificultad para ver.  Tiene prdida de lquido o le sale un chorro de lquido de la vagina antes de estar en la semana 37.  Tiene espasmos abdominales (contracciones) regulares antes de estar en la semana 37. Resumen  El tercer trimestre comprende desde la semana28 hasta la semana40 (desde el mes7 hasta el mes9). Esta es la poca en que el beb en gestacin crece muy rpidamente.  Siga los consejos del mdico  con respecto a los medicamentos, la alimentacin y la actividad.  Preprese para la llegada del beb tomando las clases prenatales, preparando todo lo que necesitar el beb, arreglando la habitacin del beb y concurriendo a los controles mdicos.  Solicite ayuda de inmediato si tiene sangrado por la vagina, siente dolor en el pecho o tiene dificultad para respirar, o si no ha sentido que su beb se mueve en el transcurso de ms de una hora. Esta informacin no tiene como fin reemplazar el consejo del mdico. Asegrese de hacerle al mdico cualquier pregunta que tenga. Document Released: 01/15/2013 Document Revised: 12/18/2016 Document Reviewed: 12/18/2016 Elsevier Interactive Patient Education  2019 Elsevier Inc.  

## 2018-05-30 NOTE — Progress Notes (Unsigned)
Pt not fasting. Please reschedule for 06/03/18.  [employer] CPT Phlebotomist

## 2018-05-31 ENCOUNTER — Other Ambulatory Visit (HOSPITAL_COMMUNITY): Payer: Self-pay | Admitting: *Deleted

## 2018-05-31 ENCOUNTER — Ambulatory Visit (HOSPITAL_COMMUNITY)
Admission: RE | Admit: 2018-05-31 | Discharge: 2018-05-31 | Disposition: A | Discharge status: Home / Self Care | Payer: Self-pay | Source: Ambulatory Visit | Attending: Family Medicine | Admitting: Family Medicine

## 2018-05-31 ENCOUNTER — Encounter (HOSPITAL_COMMUNITY): Payer: Self-pay

## 2018-05-31 DIAGNOSIS — O09892 Supervision of other high risk pregnancies, second trimester: Secondary | ICD-10-CM

## 2018-05-31 DIAGNOSIS — O99212 Obesity complicating pregnancy, second trimester: Secondary | ICD-10-CM

## 2018-05-31 DIAGNOSIS — O09529 Supervision of elderly multigravida, unspecified trimester: Secondary | ICD-10-CM

## 2018-05-31 DIAGNOSIS — Z362 Encounter for other antenatal screening follow-up: Secondary | ICD-10-CM

## 2018-05-31 DIAGNOSIS — O09522 Supervision of elderly multigravida, second trimester: Secondary | ICD-10-CM

## 2018-05-31 DIAGNOSIS — Z3A27 27 weeks gestation of pregnancy: Secondary | ICD-10-CM

## 2018-05-31 DIAGNOSIS — O09292 Supervision of pregnancy with other poor reproductive or obstetric history, second trimester: Secondary | ICD-10-CM

## 2018-05-31 DIAGNOSIS — O34212 Maternal care for vertical scar from previous cesarean delivery: Secondary | ICD-10-CM

## 2018-05-31 DIAGNOSIS — O09212 Supervision of pregnancy with history of pre-term labor, second trimester: Secondary | ICD-10-CM

## 2018-05-31 DIAGNOSIS — Z8751 Personal history of pre-term labor: Secondary | ICD-10-CM | POA: Insufficient documentation

## 2018-06-04 ENCOUNTER — Other Ambulatory Visit: Payer: Self-pay

## 2018-06-05 LAB — CBC
HEMATOCRIT: 33.9 % — AB (ref 34.0–46.6)
Hemoglobin: 11.5 g/dL (ref 11.1–15.9)
MCH: 29.1 pg (ref 26.6–33.0)
MCHC: 33.9 g/dL (ref 31.5–35.7)
MCV: 86 fL (ref 79–97)
Platelets: 207 10*3/uL (ref 150–450)
RBC: 3.95 x10E6/uL (ref 3.77–5.28)
RDW: 13 % (ref 11.7–15.4)
WBC: 8.4 10*3/uL (ref 3.4–10.8)

## 2018-06-05 LAB — HIV ANTIBODY (ROUTINE TESTING W REFLEX): HIV Screen 4th Generation wRfx: NONREACTIVE

## 2018-06-05 LAB — GLUCOSE TOLERANCE, 2 HOURS W/ 1HR
GLUCOSE, 2 HOUR: 94 mg/dL (ref 65–152)
Glucose, 1 hour: 125 mg/dL (ref 65–179)
Glucose, Fasting: 79 mg/dL (ref 65–91)

## 2018-06-05 LAB — RPR: RPR Ser Ql: NONREACTIVE

## 2018-06-06 ENCOUNTER — Ambulatory Visit: Payer: Self-pay

## 2018-06-07 ENCOUNTER — Ambulatory Visit (INDEPENDENT_AMBULATORY_CARE_PROVIDER_SITE_OTHER): Payer: Self-pay | Admitting: *Deleted

## 2018-06-07 VITALS — BP 114/58 | HR 80 | Wt 209.5 lb

## 2018-06-07 DIAGNOSIS — Z8751 Personal history of pre-term labor: Secondary | ICD-10-CM

## 2018-06-07 DIAGNOSIS — O09212 Supervision of pregnancy with history of pre-term labor, second trimester: Secondary | ICD-10-CM

## 2018-06-07 DIAGNOSIS — O0992 Supervision of high risk pregnancy, unspecified, second trimester: Secondary | ICD-10-CM

## 2018-06-07 NOTE — Progress Notes (Signed)
I have reviewed this chart and agree with the RN/CMA assessment and management.    Sandralee Tarkington C Chelisa Hennen, MD, FACOG Attending Physician, Faculty Practice [facility]  

## 2018-06-07 NOTE — Progress Notes (Signed)
Lindsey Wells here for 17-P  Injection. Noted on left arm small reddened area- patient states she think she got bit by a bug and it itches a little. Also noted scant pink rask near bite. Discussed with Dr.Dove and will proceed with 17p. Advised patient may use benadryl cream or hydrocortisone cream 1% as needed. If rash worsens will need to discuss at next  Visit if related to 17p. She voices understanding.  Injection administered without complication. Patient will return in one week for next injection.  [name],RN 06/07/2018  9:27 AM

## 2018-06-10 ENCOUNTER — Ambulatory Visit: Payer: Self-pay

## 2018-06-14 ENCOUNTER — Encounter: Payer: Self-pay | Admitting: Obstetrics and Gynecology

## 2018-06-14 ENCOUNTER — Ambulatory Visit (INDEPENDENT_AMBULATORY_CARE_PROVIDER_SITE_OTHER): Payer: Self-pay | Admitting: Obstetrics and Gynecology

## 2018-06-14 VITALS — BP 109/62 | HR 79 | Wt 208.9 lb

## 2018-06-14 DIAGNOSIS — Z98891 History of uterine scar from previous surgery: Secondary | ICD-10-CM

## 2018-06-14 DIAGNOSIS — Z23 Encounter for immunization: Secondary | ICD-10-CM

## 2018-06-14 DIAGNOSIS — O09523 Supervision of elderly multigravida, third trimester: Secondary | ICD-10-CM

## 2018-06-14 DIAGNOSIS — N898 Other specified noninflammatory disorders of vagina: Secondary | ICD-10-CM

## 2018-06-14 DIAGNOSIS — B373 Candidiasis of vulva and vagina: Secondary | ICD-10-CM

## 2018-06-14 DIAGNOSIS — Z8751 Personal history of pre-term labor: Secondary | ICD-10-CM

## 2018-06-14 DIAGNOSIS — O0992 Supervision of high risk pregnancy, unspecified, second trimester: Secondary | ICD-10-CM

## 2018-06-14 DIAGNOSIS — O0993 Supervision of high risk pregnancy, unspecified, third trimester: Secondary | ICD-10-CM

## 2018-06-14 DIAGNOSIS — Z113 Encounter for screening for infections with a predominantly sexual mode of transmission: Secondary | ICD-10-CM

## 2018-06-14 DIAGNOSIS — Z3A29 29 weeks gestation of pregnancy: Secondary | ICD-10-CM

## 2018-06-14 NOTE — Progress Notes (Signed)
Pt states is having d/c with itching

## 2018-06-14 NOTE — Progress Notes (Signed)
Subjective:  Lindsey Wells is a 38 y.o. L2X5170 at [redacted]w[redacted]d being seen today for ongoing prenatal care.  She is currently monitored for the following issues for this high-risk pregnancy and has History of cesarean delivery; Language barrier; Supervision of high risk pregnancy, antepartum, second trimester; History of preterm labor/delivery ; History of Uterine cervical insufficiency during pregnancy; and AMA (advanced maternal age) multigravida 35+ on their problem list.  Patient reports vaginal discharge and itching.  Contractions: Not present. Vag. Bleeding: None.  Movement: Present. Denies leaking of fluid.   The following portions of the patient's history were reviewed and updated as appropriate: allergies, current medications, past family history, past medical history, past social history, past surgical history and problem list. Problem list updated.  Objective:   Vitals:   06/14/18 0818  BP: 109/62  Pulse: 79  Weight: 208 lb 14.4 oz (94.8 kg)    Fetal Status: Fetal Heart Rate (bpm): 143   Movement: Present     General:  Alert, oriented and cooperative. Patient is in no acute distress.  Skin: Skin is warm and dry. No rash noted.   Cardiovascular: Normal heart rate noted  Respiratory: Normal respiratory effort, no problems with respiration noted  Abdomen: Soft, gravid, appropriate for gestational age. Pain/Pressure: Absent     Pelvic:  Cervical exam deferred        Extremities: Normal range of motion.  Edema: Trace  Mental Status: Normal mood and affect. Normal behavior. Normal judgment and thought content.   Urinalysis:      Assessment and Plan:  Pregnancy: [id number] at [redacted]w[redacted]d  1. Supervision of high risk pregnancy, antepartum, second trimester Stable - Tdap vaccine greater than or equal to 7yo IM - Cervicovaginal ancillary only( [place])  2. History of preterm labor/delivery  No S/Sx of PTL Continue with weekly 17 OHP Stable growth, follow up growth scan next  month  3. History of cesarean delivery Remains undecided about TOLAC vs RLTCS  4. Multigravida of advanced maternal age in third trimester LR NIPS  Preterm labor symptoms and general obstetric precautions including but not limited to vaginal bleeding, contractions, leaking of fluid and fetal movement were reviewed in detail with the patient. Please refer to After Visit Summary for other counseling recommendations.  Return in about 2 weeks (around 06/28/2018) for OB visit.   Hermina Staggers, MD

## 2018-06-14 NOTE — Patient Instructions (Signed)
Tercer trimestre de embarazo Third Trimester of Pregnancy  El tercer trimestre comprende desde la semana28 hasta la semana40 (desde el mes7 hasta el mes9). En este trimestre, el beb en gestacin (feto) crece muy rpidamente. Hacia el final del noveno mes, el beb en gestacin mide alrededor de 20pulgadas (45cm) de largo. Pesa entre 6y 10libras (2,70y 4,50kg). Siga estas indicaciones en su casa: Medicamentos  Tome los medicamentos de venta libre y los recetados solamente como se lo haya indicado el mdico. Algunos medicamentos son seguros para tomar durante el embarazo y otros no lo son.  Tome vitaminas prenatales que contengan por lo menos 600microgramos (?g) de cido flico.  Si tiene dificultad para mover el intestino (estreimiento), tome un medicamento para ablandar las heces (laxante) si su mdico se lo autoriza. Comida y bebida   Ingiera alimentos saludables de manera regular.  No coma carne cruda ni quesos sin cocinar.  Si obtiene poca cantidad de calcio de los alimentos que ingiere, consulte a su mdico sobre la posibilidad de tomar un suplemento diario de calcio.  La ingesta diaria de cuatro o cinco comidas pequeas en lugar de tres comidas abundantes.  Evite el consumo de alimentos ricos en grasas y azcares, como los alimentos fritos y los dulces.  Para evitar el estreimiento: ? Consuma alimentos ricos en fibra, como frutas y verduras frescas, cereales integrales y frijoles. ? Beba suficiente lquido para mantener el pis (orina) claro o de color amarillo plido. Actividad  Haga ejercicios solamente como se lo haya indicado el mdico. Interrumpa la actividad fsica si comienza a tener calambres.  No levante objetos pesados, use zapatos de tacones bajos y sintese derecha.  No haga ejercicio si hace demasiado calor, hay demasiada humedad o se encuentra en un lugar de mucha altura (altitud alta).  Puede continuar teniendo relaciones sexuales, a menos que el  mdico le indique lo contrario. Alivio del dolor y del malestar  Use un sostn que le brinde buen soporte si sus mamas estn sensibles.  Haga pausas frecuentes y descanse con las piernas levantadas si tiene calambres en las piernas o dolor en la zona lumbar.  Dese baos de asiento con agua tibia para aliviar el dolor o las molestias causadas por las hemorroides. Use una crema para las hemorroides si el mdico la autoriza.  Si desarrolla venas hinchadas y abultadas (vrices) en las piernas: ? Use medias de compresin o medias de descanso como se lo haya indicado el mdico. ? Levante (eleve) los pies durante 15minutos, 3 o 4veces por da. ? Limite el consumo de sal en sus alimentos. Seguridad  Colquese el cinturn de seguridad cuando conduzca.  Haga una lista de los nmeros de telfono de emergencia, que incluya los nmeros de telfono de familiares, amigos, el hospital, as como los departamentos de polica y bomberos. Preparacin para la llegada del beb Para prepararse para la llegada de su beb:  Tome clases prenatales.  Practique ir manejando al hospital.  Visite el hospital y recorra el rea de maternidad.  Hable en su trabajo acerca de tomar licencia cuando llegue el beb.  Prepare el bolso que llevar al hospital.  Prepare la habitacin del beb.  Concurra a los controles mdicos.  Compre un asiento de seguridad orientado hacia atrs para llevar al beb en el automvil. Aprenda cmo instalarlo en el auto. Instrucciones generales  No se d baos de inmersin en agua caliente, baos turcos ni saunas.  No consuma ningn producto que contenga nicotina o tabaco, como cigarrillos y cigarrillos   electrnicos. Si necesita ayuda para dejar de fumar, consulte al mdico.  No beba alcohol.  No se haga duchas vaginales ni use tampones o toallas higinicas perfumadas.  No mantenga las piernas cruzadas durante mucho tiempo.  No haga viajes de larga distancia, excepto si es  obligatorio. Hgalos solamente si su mdico la autoriza.  Visite a su dentista si no lo ha hecho durante el embarazo. Use un cepillo de cerdas suaves para cepillarse los dientes. Psese el hilo dental con suavidad.  Evite el contacto con las bandejas sanitarias de los gatos y la tierra que estos animales usan. Estos elementos contienen bacterias que pueden causar defectos congnitos al beb y la posible prdida del beb (aborto espontneo) o la muerte fetal.  Concurra a todas las visitas prenatales como se lo haya indicado el mdico. Esto es importante. Comunquese con un mdico si:  No est segura de si est en trabajo de parto o si ha roto la bolsa de las aguas.  Tiene mareos.  Tiene clicos leves o siente presin en la parte baja del vientre.  Sufre un dolor persistente en el abdomen.  Sigue teniendo malestar estomacal, vomita o tiene heces lquidas.  Advierte un lquido con olor ftido que proviene de la vagina.  Siente dolor al orinar. Solicite ayuda de inmediato si:  Tiene fiebre.  Tiene una prdida de lquido por la vagina.  Tiene sangrado o pequeas prdidas vaginales.  Siente dolor intenso o clicos en el abdomen.  Aumenta o baja de peso rpidamente.  Tiene dificultades para recuperar el aliento y siente dolor en el pecho.  Sbitamente se le hinchan mucho el rostro, las manos, los tobillos, los pies o las piernas.  No ha sentido los movimientos del beb durante una hora.  Siente un dolor de cabeza intenso que no se alivia con medicamentos.  Tiene dificultad para ver.  Tiene prdida de lquido o le sale un chorro de lquido de la vagina antes de estar en la semana 37.  Tiene espasmos abdominales (contracciones) regulares antes de estar en la semana 37. Resumen  El tercer trimestre comprende desde la semana28 hasta la semana40 (desde el mes7 hasta el mes9). Esta es la poca en que el beb en gestacin crece muy rpidamente.  Siga los consejos del mdico  con respecto a los medicamentos, la alimentacin y la actividad.  Preprese para la llegada del beb tomando las clases prenatales, preparando todo lo que necesitar el beb, arreglando la habitacin del beb y concurriendo a los controles mdicos.  Solicite ayuda de inmediato si tiene sangrado por la vagina, siente dolor en el pecho o tiene dificultad para respirar, o si no ha sentido que su beb se mueve en el transcurso de ms de una hora. Esta informacin no tiene como fin reemplazar el consejo del mdico. Asegrese de hacerle al mdico cualquier pregunta que tenga. Document Released: 01/15/2013 Document Revised: 12/18/2016 Document Reviewed: 12/18/2016 Elsevier Interactive Patient Education  2019 Elsevier Inc.  

## 2018-06-17 LAB — CERVICOVAGINAL ANCILLARY ONLY
Bacterial vaginitis: NEGATIVE
CANDIDA VAGINITIS: POSITIVE — AB
CHLAMYDIA, DNA PROBE: NEGATIVE
Neisseria Gonorrhea: NEGATIVE
Trichomonas: NEGATIVE

## 2018-06-18 ENCOUNTER — Telehealth: Payer: Self-pay | Admitting: *Deleted

## 2018-06-18 DIAGNOSIS — B379 Candidiasis, unspecified: Secondary | ICD-10-CM

## 2018-06-18 MED ORDER — TERCONAZOLE 0.4 % VA CREA: 1.0000 | TOPICAL_CREAM | Freq: Every day | VAGINAL | 0 refills | Status: DC

## 2018-06-18 NOTE — Telephone Encounter (Signed)
-----   Message from Hermina StaggersMichael L Ervin, MD sent at 06/18/2018 10:54 AM EST ----- Terazol vaginal cream 1 applicator qhs x 7 days for yeast Thanks Casimiro NeedleMichael

## 2018-06-18 NOTE — Telephone Encounter (Signed)
Interpreter [phone] used to call pt to inform her that she has a yeast infection and a prescription for Terazol vaginal cream was sent to the [facility] on [address] and she needs to apply the cream nightly for 7 days. Pt verbalized understanding.

## 2018-06-21 ENCOUNTER — Ambulatory Visit (INDEPENDENT_AMBULATORY_CARE_PROVIDER_SITE_OTHER): Payer: Self-pay | Admitting: *Deleted

## 2018-06-21 VITALS — BP 98/54 | HR 72 | Wt 208.7 lb

## 2018-06-21 DIAGNOSIS — O09899 Supervision of other high risk pregnancies, unspecified trimester: Secondary | ICD-10-CM

## 2018-06-21 DIAGNOSIS — O0992 Supervision of high risk pregnancy, unspecified, second trimester: Secondary | ICD-10-CM

## 2018-06-21 DIAGNOSIS — O09213 Supervision of pregnancy with history of pre-term labor, third trimester: Secondary | ICD-10-CM

## 2018-06-21 DIAGNOSIS — O09219 Supervision of pregnancy with history of pre-term labor, unspecified trimester: Secondary | ICD-10-CM

## 2018-06-21 NOTE — Progress Notes (Signed)
Lindsey Wells here for 17-P  Injection.  Injection administered without complication. Patient will return in one week for next injection.  [name],RN 06/21/2018  9:34 AM

## 2018-06-22 NOTE — Progress Notes (Signed)
Patient ID: Lindsey Wells, female   DOB: [birth year], 38 y.o.   MRN: [id number] I have reviewed the chart and agree with nursing staff's documentation of this patient's encounter.  Scheryl Darter, MD 06/22/2018 10:47 AM

## 2018-06-28 ENCOUNTER — Encounter: Payer: Self-pay | Admitting: Obstetrics and Gynecology

## 2018-06-28 ENCOUNTER — Ambulatory Visit (INDEPENDENT_AMBULATORY_CARE_PROVIDER_SITE_OTHER): Payer: Self-pay | Admitting: Emergency Medicine

## 2018-06-28 VITALS — BP 109/61 | HR 63 | Wt 212.8 lb

## 2018-06-28 DIAGNOSIS — O0992 Supervision of high risk pregnancy, unspecified, second trimester: Secondary | ICD-10-CM

## 2018-06-28 DIAGNOSIS — O09219 Supervision of pregnancy with history of pre-term labor, unspecified trimester: Secondary | ICD-10-CM

## 2018-06-28 DIAGNOSIS — O09899 Supervision of other high risk pregnancies, unspecified trimester: Secondary | ICD-10-CM

## 2018-06-28 DIAGNOSIS — O09213 Supervision of pregnancy with history of pre-term labor, third trimester: Secondary | ICD-10-CM

## 2018-06-28 NOTE — Progress Notes (Signed)
Lindsey Wells here for 17-P  Injection.  Injection administered without complication. Patient will return in one week for next injection.  Toni Amend RN

## 2018-07-01 ENCOUNTER — Encounter: Payer: Self-pay | Admitting: Obstetrics & Gynecology

## 2018-07-05 ENCOUNTER — Ambulatory Visit (INDEPENDENT_AMBULATORY_CARE_PROVIDER_SITE_OTHER): Payer: Self-pay | Admitting: Obstetrics and Gynecology

## 2018-07-05 ENCOUNTER — Encounter: Payer: Self-pay | Admitting: Obstetrics and Gynecology

## 2018-07-05 VITALS — BP 128/79 | HR 90 | Wt 216.0 lb

## 2018-07-05 DIAGNOSIS — O0993 Supervision of high risk pregnancy, unspecified, third trimester: Secondary | ICD-10-CM

## 2018-07-05 DIAGNOSIS — O09523 Supervision of elderly multigravida, third trimester: Secondary | ICD-10-CM

## 2018-07-05 DIAGNOSIS — Z789 Other specified health status: Secondary | ICD-10-CM

## 2018-07-05 DIAGNOSIS — Z8751 Personal history of pre-term labor: Secondary | ICD-10-CM

## 2018-07-05 DIAGNOSIS — Z98891 History of uterine scar from previous surgery: Secondary | ICD-10-CM

## 2018-07-05 DIAGNOSIS — O0992 Supervision of high risk pregnancy, unspecified, second trimester: Secondary | ICD-10-CM

## 2018-07-05 DIAGNOSIS — O09213 Supervision of pregnancy with history of pre-term labor, third trimester: Secondary | ICD-10-CM

## 2018-07-05 NOTE — Progress Notes (Signed)
   PRENATAL VISIT NOTE  Subjective:  Lindsey Wells is a 38 y.o. [id number] at [redacted]w[redacted]d being seen today for ongoing prenatal care.  She is currently monitored for the following issues for this high-risk pregnancy and has History of cesarean delivery; Language barrier; Supervision of high risk pregnancy, antepartum, second trimester; History of preterm labor/delivery ; History of Uterine cervical insufficiency during pregnancy; and AMA (advanced maternal age) multigravida 35+ on their problem list.  Patient reports cough, cold, congestion. Occasional contractions.  Contractions: Not present. Vag. Bleeding: None.  Movement: Present. Denies leaking of fluid. Complains of itching at her 17P injection site  The following portions of the patient's history were reviewed and updated as appropriate: allergies, current medications, past family history, past medical history, past social history, past surgical history and problem list. Problem list updated.  Objective:   Vitals:   07/05/18 1148  BP: 128/79  Pulse: 90  Weight: 216 lb (98 kg)    Fetal Status: Fetal Heart Rate (bpm): 134   Movement: Present     General:  Alert, oriented and cooperative. Patient is in no acute distress.  Skin: Skin is warm and dry. No rash noted.   Cardiovascular: Normal heart rate noted  Respiratory: Normal respiratory effort, no problems with respiration noted  Abdomen: Soft, gravid, appropriate for gestational age.  Pain/Pressure: Absent     Pelvic: Cervical exam deferred        Extremities: Normal range of motion.  Edema: Trace  Mental Status: Normal mood and affect. Normal behavior. Normal judgment and thought content.   Assessment and Plan:  Pregnancy: [id number] at [redacted]w[redacted]d  1. Supervision of high risk pregnancy, antepartum, second trimester Last growth 50%tile, next grow 2/28  2. Multigravida of advanced maternal age in third trimester  3. Language barrier Engineer, structural used  4. History of  cesarean delivery X2 Patient has expressed interest in TOLAC. Reviewed risks/benefits of TOLAC versus RCS in detail. Patient counseled regarding potential vaginal delivery, chance of success, future implications, possible uterine rupture and need for urgent/emergent repeat cesarean. Counseled regarding potential need for repeat c-section for reasons unrelated to first c-section. Counseled regarding scheduled repeat cesarean including risks of bleeding, infection, damage to surrounding tissue, abnormal placentation, implications for future pregnancies. All questions answered.  Patient unsure, reviewed that she should talk to partner and will plan to make decision next visit  5. History of preterm labor/delivery  Patient does not want to continue 17P due to itching, after discussion, she will get it today and then does not want to continue  Preterm labor symptoms and general obstetric precautions including but not limited to vaginal bleeding, contractions, leaking of fluid and fetal movement were reviewed in detail with the patient. Please refer to After Visit Summary for other counseling recommendations.  Return in about 2 weeks (around 07/19/2018) for OB visit (MD).  Future Appointments  Date Time Provider Department Center  07/26/2018  8:30 AM [facility] [place] 1 [facility]    Conan Bowens, MD

## 2018-07-12 ENCOUNTER — Other Ambulatory Visit: Payer: Self-pay

## 2018-07-12 ENCOUNTER — Ambulatory Visit (INDEPENDENT_AMBULATORY_CARE_PROVIDER_SITE_OTHER): Payer: Self-pay | Admitting: *Deleted

## 2018-07-12 VITALS — BP 118/62 | HR 61 | Wt 216.6 lb

## 2018-07-12 DIAGNOSIS — O09219 Supervision of pregnancy with history of pre-term labor, unspecified trimester: Principal | ICD-10-CM

## 2018-07-12 DIAGNOSIS — O09899 Supervision of other high risk pregnancies, unspecified trimester: Secondary | ICD-10-CM

## 2018-07-12 DIAGNOSIS — O09213 Supervision of pregnancy with history of pre-term labor, third trimester: Secondary | ICD-10-CM

## 2018-07-12 NOTE — Progress Notes (Signed)
Lindsey Wells here for 17-P  Injection. Lindsey Wells c/o itching worse but no rash. discussed with Dr.Dove and informed patient is side effect of hydroxyprogesterone; we recommend continuing injections; but if she feels she can not tolerate injections we can stop at any time. We also discussed if she has allergic reaction such as rash or difficulty breathing will stop injections.  Injection administered without complication. Patient will return in one week for next injection. She voices understanding.   [name],RN 07/12/2018  9:31 AM

## 2018-07-15 NOTE — Progress Notes (Signed)
I have reviewed this chart and agree with the RN/CMA assessment and management.    Verania Salberg C October Peery, MD, FACOG Attending Physician, Faculty Practice [facility]  

## 2018-07-19 ENCOUNTER — Encounter: Payer: Self-pay | Admitting: Obstetrics and Gynecology

## 2018-07-25 ENCOUNTER — Encounter (HOSPITAL_COMMUNITY): Payer: Self-pay

## 2018-07-26 ENCOUNTER — Ambulatory Visit (HOSPITAL_COMMUNITY)
Admission: RE | Admit: 2018-07-26 | Discharge: 2018-07-26 | Disposition: A | Discharge status: Home / Self Care | Payer: Self-pay | Source: Ambulatory Visit | Attending: Family Medicine | Admitting: Family Medicine

## 2018-07-26 ENCOUNTER — Ambulatory Visit (HOSPITAL_COMMUNITY): Payer: Self-pay | Admitting: *Deleted

## 2018-07-26 ENCOUNTER — Encounter (HOSPITAL_COMMUNITY): Payer: Self-pay

## 2018-07-26 VITALS — BP 134/77 | HR 62 | Wt 222.2 lb

## 2018-07-26 DIAGNOSIS — Z3A35 35 weeks gestation of pregnancy: Secondary | ICD-10-CM

## 2018-07-26 DIAGNOSIS — O09213 Supervision of pregnancy with history of pre-term labor, third trimester: Principal | ICD-10-CM

## 2018-07-26 DIAGNOSIS — O09893 Supervision of other high risk pregnancies, third trimester: Secondary | ICD-10-CM

## 2018-07-26 DIAGNOSIS — O09293 Supervision of pregnancy with other poor reproductive or obstetric history, third trimester: Secondary | ICD-10-CM

## 2018-07-26 DIAGNOSIS — O09529 Supervision of elderly multigravida, unspecified trimester: Secondary | ICD-10-CM | POA: Insufficient documentation

## 2018-07-26 DIAGNOSIS — O99213 Obesity complicating pregnancy, third trimester: Secondary | ICD-10-CM

## 2018-07-26 DIAGNOSIS — O34219 Maternal care for unspecified type scar from previous cesarean delivery: Secondary | ICD-10-CM

## 2018-07-26 DIAGNOSIS — O09523 Supervision of elderly multigravida, third trimester: Secondary | ICD-10-CM

## 2018-08-01 ENCOUNTER — Ambulatory Visit (INDEPENDENT_AMBULATORY_CARE_PROVIDER_SITE_OTHER): Payer: Self-pay | Admitting: Obstetrics & Gynecology

## 2018-08-01 ENCOUNTER — Other Ambulatory Visit: Payer: Self-pay | Admitting: Obstetrics & Gynecology

## 2018-08-01 VITALS — BP 132/72 | HR 56 | Wt 221.5 lb

## 2018-08-01 DIAGNOSIS — Z113 Encounter for screening for infections with a predominantly sexual mode of transmission: Secondary | ICD-10-CM

## 2018-08-01 DIAGNOSIS — O0992 Supervision of high risk pregnancy, unspecified, second trimester: Secondary | ICD-10-CM

## 2018-08-01 DIAGNOSIS — Z3A36 36 weeks gestation of pregnancy: Secondary | ICD-10-CM

## 2018-08-01 DIAGNOSIS — O0993 Supervision of high risk pregnancy, unspecified, third trimester: Secondary | ICD-10-CM

## 2018-08-01 NOTE — Progress Notes (Signed)
Patient ID: Lindsey Wells, female   DOB: [birth year], 38 y.o.   MRN: [id number] Orders for cesarean section at 39 weeks

## 2018-08-01 NOTE — Progress Notes (Signed)
   PRENATAL VISIT NOTE  Subjective:  Lindsey Wells is a 38 y.o. [id number] at [redacted]w[redacted]d being seen today for ongoing prenatal care.  She is currently monitored for the following issues for this high-risk pregnancy and has History of cesarean delivery; Language barrier; Supervision of high risk pregnancy, antepartum, second trimester; History of preterm labor/delivery ; History of Uterine cervical insufficiency during pregnancy; and AMA (advanced maternal age) multigravida 35+ on their problem list.  Patient reports no complaints.  Contractions: Not present. Vag. Bleeding: None.  Movement: Present. Denies leaking of fluid.   The following portions of the patient's history were reviewed and updated as appropriate: allergies, current medications, past family history, past medical history, past social history, past surgical history and problem list. Problem list updated.  Objective:   Vitals:   08/01/18 1121  BP: 132/72  Pulse: (!) 56  Weight: 221 lb 8 oz (100.5 kg)    Fetal Status: Fetal Heart Rate (bpm): 124   Movement: Present     General:  Alert, oriented and cooperative. Patient is in no acute distress.  Skin: Skin is warm and dry. No rash noted.   Cardiovascular: Normal heart rate noted  Respiratory: Normal respiratory effort, no problems with respiration noted  Abdomen: Soft, gravid, appropriate for gestational age.  Pain/Pressure: Present     Pelvic: Cervical exam performed        Extremities: Normal range of motion.  Edema: Trace  Mental Status: Normal mood and affect. Normal behavior. Normal judgment and thought content.   Assessment and Plan:  Pregnancy: [id number] at [redacted]w[redacted]d  1. Supervision of high risk pregnancy, antepartum, second trimester Routine testing - Culture, beta strep (group b only) - GC/Chlamydia probe amp ([place])not at [facility] Schedule repeat CS at 39 weeks Preterm labor symptoms and general obstetric precautions including but not limited to vaginal  bleeding, contractions, leaking of fluid and fetal movement were reviewed in detail with the patient. Please refer to After Visit Summary for other counseling recommendations.  No follow-ups on file.  No future appointments.  Scheryl Darter, MD

## 2018-08-02 LAB — GC/CHLAMYDIA PROBE AMP (~~LOC~~) NOT AT ARMC
Chlamydia: NEGATIVE
Neisseria Gonorrhea: NEGATIVE

## 2018-08-05 LAB — CULTURE, BETA STREP (GROUP B ONLY): Strep Gp B Culture: NEGATIVE

## 2018-08-08 ENCOUNTER — Ambulatory Visit (INDEPENDENT_AMBULATORY_CARE_PROVIDER_SITE_OTHER): Payer: Medicaid Other | Admitting: Obstetrics & Gynecology

## 2018-08-08 ENCOUNTER — Encounter (HOSPITAL_COMMUNITY): Payer: Self-pay

## 2018-08-08 ENCOUNTER — Other Ambulatory Visit: Payer: Self-pay

## 2018-08-08 ENCOUNTER — Encounter: Payer: Self-pay | Admitting: Obstetrics & Gynecology

## 2018-08-08 VITALS — BP 131/77 | HR 61 | Wt 223.1 lb

## 2018-08-08 DIAGNOSIS — O0993 Supervision of high risk pregnancy, unspecified, third trimester: Secondary | ICD-10-CM

## 2018-08-08 DIAGNOSIS — Z98891 History of uterine scar from previous surgery: Secondary | ICD-10-CM | POA: Diagnosis not present

## 2018-08-08 DIAGNOSIS — Z3A37 37 weeks gestation of pregnancy: Secondary | ICD-10-CM

## 2018-08-08 DIAGNOSIS — O0992 Supervision of high risk pregnancy, unspecified, second trimester: Secondary | ICD-10-CM

## 2018-08-08 NOTE — Progress Notes (Signed)
   PRENATAL VISIT NOTE  Subjective:  Lindsey Wells is a 38 y.o. [id number] at [redacted]w[redacted]d being seen today for ongoing prenatal care.  She is currently monitored for the following issues for this high-risk pregnancy and has History of cesarean delivery; Language barrier; Supervision of high risk pregnancy, antepartum, second trimester; History of preterm labor/delivery ; History of Uterine cervical insufficiency during pregnancy; and AMA (advanced maternal age) multigravida 35+ on their problem list.  Patient reports no complaints.  Contractions: Not present. Vag. Bleeding: None.  Movement: Present. Denies leaking of fluid.   The following portions of the patient's history were reviewed and updated as appropriate: allergies, current medications, past family history, past medical history, past social history, past surgical history and problem list.   Objective:   Vitals:   08/08/18 1125  BP: 131/77  Pulse: 61  Weight: 223 lb 1.6 oz (101.2 kg)    Fetal Status: Fetal Heart Rate (bpm): 140   Movement: Present     General:  Alert, oriented and cooperative. Patient is in no acute distress.  Skin: Skin is warm and dry. No rash noted.   Cardiovascular: Normal heart rate noted  Respiratory: Normal respiratory effort, no problems with respiration noted  Abdomen: Soft, gravid, appropriate for gestational age.  Pain/Pressure: Present     Pelvic: Cervical exam deferred        Extremities: Normal range of motion.  Edema: None  Mental Status: Normal mood and affect. Normal behavior. Normal judgment and thought content.   Assessment and Plan:  Pregnancy: [id number] at [redacted]w[redacted]d There are no diagnoses linked to this encounter. Term labor symptoms and general obstetric precautions including but not limited to vaginal bleeding, contractions, leaking of fluid and fetal movement were reviewed in detail with the patient. Please refer to After Visit Summary for other counseling recommendations.  Is  considering BTL at CS Return if symptoms worsen or fail to improve, for postop c/s.  Future Appointments  Date Time Provider Department Center  08/15/2018 11:30 AM [facility] PAT 1 [facility] None    Scheryl Darter, MD

## 2018-08-08 NOTE — Pre-Procedure Instructions (Signed)
[id number] interpreter number

## 2018-08-08 NOTE — Patient Instructions (Signed)
Parto por cesárea °Cesarean Delivery °El nacimiento o parto por cesárea es el parto quirúrgico de un bebé a través de una incisión en el abdomen y el útero. Se lo conoce como cesárea. Este procedimiento se puede programar con anticipación o se puede realizar en una situación de emergencia. °Informe al médico acerca de lo siguiente: °· Cualquier alergia que tenga. °· Todos los medicamentos que utiliza, incluidos vitaminas, hierbas, gotas oftálmicas, cremas y medicamentos de venta libre. °· Cualquier problema previo que usted o algún miembro de su familia haya tenido con los anestésicos. °· Cualquier trastorno de la sangre que tenga. °· Cirugías a las que se haya sometido. °· Cualquier afección médica que tenga. °· Si usted o algún familiar tiene antecedentes de trombosis venosa profunda (TVP) o embolia pulmonar (EP). °¿Cuáles son los riesgos? °En general, se trata de un procedimiento seguro. Sin embargo, pueden ocurrir complicaciones, por ejemplo: °· Infección. °· Sangrado. °· Reacciones alérgicas a los medicamentos. °· Daños a otras estructuras u órganos. °· Coágulos de sangre. °· Lesiones al bebé. °¿Qué ocurre antes del procedimiento? °Indicaciones generales °· Siga las indicaciones del médico respecto de las restricciones en las comidas o las bebidas. °· Si sabe que va a tener un parto por cesárea, no se rasure la zona púbica. Rasurarse antes del procedimiento puede aumentar el riesgo de infección. °· Pídale a alguien que la lleve a su casa desde el hospital. °· Pregúntele al médico qué medidas se tomarán para prevenir una infección. Estas pueden incluir: °? Rasurar el vello del lugar de la cirugía. °? Lavar la piel con un jabón antiséptico. °? Suministrar antibióticos. °· Según el motivo del parto por cesárea, es posible que se le realice un examen físico o una prueba adicional, como una ecografía. °· Posiblemente le realicen un análisis de sangre u orina. °Preguntas para hacerle al médico °· Consulte al médico  sobre: °? Cambiar o suspender los medicamentos que toma habitualmente. Esto es muy importante si toma medicamentos para la diabetes o anticoagulantes. °? El plan para controlar el dolor. Esto es muy importante si piensa amamantar a su bebé. °? Cuánto tiempo permanecerá en el hospital después del procedimiento. °? Cualquier inquietud que pueda tener sobre recibir hemoderivados, si los necesita durante el procedimiento. °? Almacenamiento de sangre del cordón, si planea guardar la sangre del cordón umbilical del bebé. °· Quizá también desee preguntarle al médico lo siguiente: °? Si va a poder sostener o amamantar a su bebé mientras aún se encuentre en el quirófano. °? Si su bebé puede quedarse con usted inmediatamente después del procedimiento y durante su recuperación. °? Si un familiar o la persona que usted elija puede ingresar al quirófano y permanecer con usted durante el procedimiento, inmediatamente después de este y durante su recuperación. °¿Qué ocurre durante el procedimiento? ° °· Le colocarán una vía intravenosa en una vena. °· Le administrarán líquido y medicamentos, tales como antibióticos, antes de la cirugía. °· Le colocarán monitores fetales en el abdomen para controlar la frecuencia cardíaca de su bebé. °· Se le puede entregar una bata especial de calentamiento para mantener estable la temperatura corporal. °· Se le puede insertar un catéter en la vejiga a través de la uretra. Esto se hace para drenar la orina durante el procedimiento. °· Pueden administrarle uno o más de los siguientes medicamentos: °? Un medicamento para adormecer la zona (anestesia local). °? Un medicamento que la hará dormir (anestesia general). °? Un medicamento (anestesia regional) que se le inyecta en la espalda o a   través de un tubo pequeño y delgado que se le coloca en la espalda (anestesia raquídea o anestesia epidural). Esto adormece la parte del cuerpo que está por debajo del sitio de la inyección y le permite permanecer  despierta durante el procedimiento. Si llega a sentir náuseas, dígaselo al médico. Tendrá medicamentos disponibles para ayudarla a reducir cualquier náusea que pueda sentir. °· Le harán una incisión en el abdomen y luego en el útero. °· Si está despierta durante el procedimiento, puede sentir tirones en el abdomen, pero no debería sentir dolor. Si siente dolor, dígaselo al médico inmediatamente. °· Se sacará al bebé del útero. Es posible que sienta más presión o un tirón, mientras esto sucede. °· Inmediatamente después del nacimiento, se secará al bebé y se lo mantendrá caliente. Podrá sostener y amamantar a su bebé. °· Durante este momento, es posible que se pince y corte el cordón umbilical. Esto ocurre por lo general después de un período de 1 a 2 minutos después del parto. °· Se le extraerá la placenta del útero. °· Las incisiones se cerrarán con puntos (suturas). Es posible que se apliquen grapas, goma para cerrar la piel o tiras adhesivas en la incisión del abdomen. °· Pueden colocarle vendas (vendajes) sobre la incisión del abdomen. °El procedimiento puede variar según el médico y el hospital. °¿Qué ocurre después del procedimiento? °· Le controlarán la presión arterial, la frecuencia cardíaca, la frecuencia respiratoria y el nivel de oxígeno en la sangre hasta que le den el alta del hospital. °· Puede seguir recibiendo líquidos o medicamentos por vía intravenosa. °· Sentirá un poco de dolor. Tendrá analgésicos disponibles para ayudarla a controlar el dolor. °· Para evitar la formación de coágulos de sangre: °? Se le pueden administrar medicamentos. °? Quizás deba usar medias o dispositivos de compresión. °? Se le indicará que camine cuando pueda hacerlo. °· El personal del hospital alentará y apoyará que cree lazos con su bebé. En el hospital, es posible que le permitan que el bebé permanezca en la misma habitación que usted (internación conjunta) durante la hospitalización para fomentar la creación del  vínculo y un amamantamiento exitoso. °· Se le puede sugerir que tosa y respire profundamente con frecuencia. Esto ayuda a evitar problemas pulmonares. °· Si tiene un catéter que le drena la orina, se le quitará lo antes posible después del procedimiento. °Resumen °· El nacimiento o parto por cesárea es el parto quirúrgico de un bebé a través de una incisión en el abdomen y el útero. °· Siga las indicaciones del médico con respecto a las restricciones de comidas o bebidas antes del procedimiento. °· Sentirá algo de dolor después del procedimiento. Tendrá analgésicos disponibles para ayudarla a controlar el dolor. °· El personal del hospital alentará y apoyará que cree lazos con su bebé después del procedimiento. En el hospital, es posible que le permitan que el bebé permanezca en la misma habitación que usted (internación conjunta) durante la hospitalización para fomentar la creación del vínculo y un amamantamiento exitoso. °Esta información no tiene como fin reemplazar el consejo del médico. Asegúrese de hacerle al médico cualquier pregunta que tenga. °Document Released: 05/15/2005 Document Revised: 12/27/2017 Document Reviewed: 12/27/2017 °Elsevier Interactive Patient Education © 2019 [employer]vier Inc. ° °

## 2018-08-14 NOTE — Patient Instructions (Signed)
      Instrucciones de Cirugia   Su cirugia esta programada para 08/17/18  ( your procedure is scheduled on) Entre por la entrada C a la(s) [facility] a las 0730 de la [place] -(enter through  Entrance C at [facility] at 0730 AM    [address] [place], [place] [id number] e informenos de su llegada ( pick up phone, dial [phone] on arrival)     Por favor llame al [phone] si tiene algun problema la [place] de cirugia ( Please call this number if you have any problems the morning of surgery.).                  Recuerde: (Remember)  NO coma alimentos ( Do not eat food  (After Midnight) Desps de medianoche)   NO toma liquidos claros (Do not drink clear liquids (After Midnight) Desps de medianoche)   NO use joyas, maquillaje de ojos, lapiz labial, crema para el cuerpo o esmalte de unas oscuro - las unas de los pies pueden estar pintados. ( Do not wear jewelry, eye makeup, lipstick, body lotion, or dark fingernail polish). No puede usar desodorante ( you may wear deodorant)   NO afeitado 48 horaes de su [place]. (Do not shave 48 hours before your surgery)   NO traiga objetos de valor al hospital.  [facility] es responsible de cualquier petenencias u objetos de Licensed conveyancer traiga al [employer]. (Do not bring valuable to the hospital.  [place] is not responsible for any belongings or valuables brought to the hospital)   [facility] medicinas la manana de la cirugia con un sorbito de agua zanatc (take these meds the morning of surgery with a SIP of water)     Contactos, dentaduras o Puentes no se pueden usar en [place]. (Contacts, dentures or bridgework cannot be worn in surgery).  Si va a ser ingresado despues de las [place], deje la [place] en el carro hasta que se le haya asignado una habitacion. ( If you are to be admitted after surgery, leave suitcase in car until your room has been assigned.)   Nombre y numerode telephono de  Engineer, production. (Name and telephone number of your driver)     Instructiones especial Por favor leer las siguientes hojas informativas que le entregaron. (Please read over the following fact sheets that you were given)

## 2018-08-15 ENCOUNTER — Encounter (HOSPITAL_COMMUNITY)
Admission: RE | Admit: 2018-08-15 | Discharge: 2018-08-15 | Disposition: A | Discharge status: Home / Self Care | Payer: Self-pay | Source: Ambulatory Visit | Attending: Obstetrics and Gynecology | Admitting: Obstetrics and Gynecology

## 2018-08-15 DIAGNOSIS — Z01812 Encounter for preprocedural laboratory examination: Secondary | ICD-10-CM | POA: Insufficient documentation

## 2018-08-15 LAB — CBC
HEMATOCRIT: 36.1 % (ref 36.0–46.0)
HEMOGLOBIN: 12.3 g/dL (ref 12.0–15.0)
MCH: 28.9 pg (ref 26.0–34.0)
MCHC: 34.1 g/dL (ref 30.0–36.0)
MCV: 84.9 fL (ref 80.0–100.0)
Platelets: 164 10*3/uL (ref 150–400)
RBC: 4.25 MIL/uL (ref 3.87–5.11)
RDW: 13.9 % (ref 11.5–15.5)
WBC: 8 10*3/uL (ref 4.0–10.5)
nRBC: 0 % (ref 0.0–0.2)

## 2018-08-15 LAB — TYPE AND SCREEN
ABO/RH(D): AB POS
Antibody Screen: NEGATIVE

## 2018-08-16 LAB — ABO/RH: ABO/RH(D): AB POS

## 2018-08-17 ENCOUNTER — Inpatient Hospital Stay (HOSPITAL_COMMUNITY)
Admission: RE | Admit: 2018-08-17 | Discharge: 2018-08-20 | DRG: 787 | Disposition: A | Discharge status: Home / Self Care | Payer: Medicaid Other | Attending: Obstetrics and Gynecology | Admitting: Obstetrics and Gynecology

## 2018-08-17 ENCOUNTER — Encounter (HOSPITAL_COMMUNITY)
Admission: RE | Disposition: A | Discharge status: Home / Self Care | Payer: Self-pay | Source: Home / Self Care | Attending: Obstetrics and Gynecology

## 2018-08-17 ENCOUNTER — Inpatient Hospital Stay (HOSPITAL_COMMUNITY): Payer: Medicaid Other | Admitting: Anesthesiology

## 2018-08-17 ENCOUNTER — Encounter (HOSPITAL_COMMUNITY): Payer: Self-pay | Admitting: *Deleted

## 2018-08-17 ENCOUNTER — Other Ambulatory Visit: Payer: Self-pay

## 2018-08-17 DIAGNOSIS — D62 Acute posthemorrhagic anemia: Secondary | ICD-10-CM | POA: Diagnosis not present

## 2018-08-17 DIAGNOSIS — O0992 Supervision of high risk pregnancy, unspecified, second trimester: Secondary | ICD-10-CM

## 2018-08-17 DIAGNOSIS — Z3A39 39 weeks gestation of pregnancy: Secondary | ICD-10-CM | POA: Diagnosis not present

## 2018-08-17 DIAGNOSIS — Z87891 Personal history of nicotine dependence: Secondary | ICD-10-CM

## 2018-08-17 DIAGNOSIS — O9081 Anemia of the puerperium: Secondary | ICD-10-CM | POA: Diagnosis not present

## 2018-08-17 DIAGNOSIS — Z98891 History of uterine scar from previous surgery: Secondary | ICD-10-CM

## 2018-08-17 DIAGNOSIS — O34211 Maternal care for low transverse scar from previous cesarean delivery: Secondary | ICD-10-CM | POA: Diagnosis present

## 2018-08-17 HISTORY — DX: History of uterine scar from previous surgery: Z98.891

## 2018-08-17 LAB — COMPREHENSIVE METABOLIC PANEL
ALT: 14 U/L (ref 0–44)
AST: 21 U/L (ref 15–41)
Albumin: 2.3 g/dL — ABNORMAL LOW (ref 3.5–5.0)
Alkaline Phosphatase: 146 U/L — ABNORMAL HIGH (ref 38–126)
Anion gap: 7 (ref 5–15)
BUN: 5 mg/dL — ABNORMAL LOW (ref 6–20)
CHLORIDE: 109 mmol/L (ref 98–111)
CO2: 21 mmol/L — ABNORMAL LOW (ref 22–32)
Calcium: 8.6 mg/dL — ABNORMAL LOW (ref 8.9–10.3)
Creatinine, Ser: 0.46 mg/dL (ref 0.44–1.00)
GFR calc Af Amer: >60 mL/min (ref >60)
GFR calc non Af Amer: >60 mL/min (ref >60)
Glucose, Bld: 73 mg/dL (ref 70–99)
Potassium: 4 mmol/L (ref 3.5–5.1)
Sodium: 137 mmol/L (ref 135–145)
Total Bilirubin: 0.3 mg/dL (ref 0.3–1.2)
Total Protein: 5 g/dL — ABNORMAL LOW (ref 6.5–8.1)

## 2018-08-17 LAB — PROTEIN / CREATININE RATIO, URINE
Creatinine, Urine: 308.37 mg/dL
Protein Creatinine Ratio: 0.15 mg/mg{Cre} (ref 0.00–0.15)
Total Protein, Urine: 46 mg/dL

## 2018-08-17 LAB — CBC
HCT: 35.3 % — ABNORMAL LOW (ref 36.0–46.0)
Hemoglobin: 12 g/dL (ref 12.0–15.0)
MCH: 28.6 pg (ref 26.0–34.0)
MCHC: 34 g/dL (ref 30.0–36.0)
MCV: 84 fL (ref 80.0–100.0)
PLATELETS: 154 10*3/uL (ref 150–400)
RBC: 4.2 MIL/uL (ref 3.87–5.11)
RDW: 14.2 % (ref 11.5–15.5)
WBC: 14.5 10*3/uL — ABNORMAL HIGH (ref 4.0–10.5)
nRBC: 0 % (ref 0.0–0.2)

## 2018-08-17 LAB — RPR: RPR Ser Ql: NONREACTIVE

## 2018-08-17 SURGERY — Surgical Case
Anesthesia: Spinal

## 2018-08-17 MED ORDER — LACTATED RINGERS IV SOLN
INTRAVENOUS | Status: DC
  Administered 2018-08-17 (×3): via INTRAVENOUS

## 2018-08-17 MED ORDER — FENTANYL CITRATE (PF) 100 MCG/2ML IJ SOLN
INTRAMUSCULAR | Status: DC | PRN
  Administered 2018-08-17: 15 ug via INTRATHECAL

## 2018-08-17 MED ORDER — BUPIVACAINE IN DEXTROSE 0.75-8.25 % IT SOLN
INTRATHECAL | Status: AC
  Filled 2018-08-17: qty 2

## 2018-08-17 MED ORDER — OXYCODONE HCL 5 MG PO TABS: 5.0000 mg | ORAL_TABLET | ORAL | Status: DC | PRN

## 2018-08-17 MED ORDER — ENOXAPARIN SODIUM 60 MG/0.6ML ~~LOC~~ SOLN
50.0000 mg | SUBCUTANEOUS | Status: DC
  Administered 2018-08-18 – 2018-08-19 (×2): 50 mg via SUBCUTANEOUS
  Filled 2018-08-17 (×2): qty 0.6

## 2018-08-17 MED ORDER — MORPHINE SULFATE (PF) 0.5 MG/ML IJ SOLN
INTRAMUSCULAR | Status: AC
  Filled 2018-08-17: qty 10

## 2018-08-17 MED ORDER — MEPERIDINE HCL 25 MG/ML IJ SOLN: 6.2500 mg | INTRAMUSCULAR | Status: DC | PRN

## 2018-08-17 MED ORDER — SODIUM CHLORIDE 0.9 % IV SOLN
INTRAVENOUS | Status: DC | PRN
  Administered 2018-08-17: 10:00:00 via INTRAVENOUS

## 2018-08-17 MED ORDER — CEFAZOLIN SODIUM-DEXTROSE 2-4 GM/100ML-% IV SOLN
INTRAVENOUS | Status: AC
  Filled 2018-08-17: qty 100

## 2018-08-17 MED ORDER — LACTATED RINGERS IV BOLUS
500.0000 mL | Freq: Once | INTRAVENOUS | Status: AC
  Administered 2018-08-17: 500 mL via INTRAVENOUS

## 2018-08-17 MED ORDER — DIPHENHYDRAMINE HCL 25 MG PO CAPS: 25.0000 mg | ORAL_CAPSULE | Freq: Four times a day (QID) | ORAL | Status: DC | PRN

## 2018-08-17 MED ORDER — PROMETHAZINE HCL 25 MG/ML IJ SOLN: 6.2500 mg | INTRAMUSCULAR | Status: DC | PRN

## 2018-08-17 MED ORDER — COCONUT OIL OIL: 1.0000 "application " | TOPICAL_OIL | Status: DC | PRN

## 2018-08-17 MED ORDER — CEFAZOLIN SODIUM-DEXTROSE 2-4 GM/100ML-% IV SOLN
2.0000 g | INTRAVENOUS | Status: AC
  Administered 2018-08-17: 2 g via INTRAVENOUS

## 2018-08-17 MED ORDER — OXYTOCIN 10 UNIT/ML IJ SOLN
INTRAVENOUS | Status: DC | PRN
  Administered 2018-08-17: 40 [IU] via INTRAVENOUS

## 2018-08-17 MED ORDER — TETANUS-DIPHTH-ACELL PERTUSSIS 5-2.5-18.5 LF-MCG/0.5 IM SUSP: 0.5000 mL | Freq: Once | INTRAMUSCULAR | Status: DC

## 2018-08-17 MED ORDER — MIDAZOLAM HCL 2 MG/2ML IJ SOLN: 0.5000 mg | Freq: Once | INTRAMUSCULAR | Status: DC | PRN

## 2018-08-17 MED ORDER — ONDANSETRON HCL 4 MG/2ML IJ SOLN
INTRAMUSCULAR | Status: AC
  Filled 2018-08-17: qty 2

## 2018-08-17 MED ORDER — DIBUCAINE 1 % RE OINT: 1.0000 "application " | TOPICAL_OINTMENT | RECTAL | Status: DC | PRN

## 2018-08-17 MED ORDER — PRENATAL MULTIVITAMIN CH
1.0000 | ORAL_TABLET | Freq: Every day | ORAL | Status: DC
  Administered 2018-08-18 – 2018-08-19 (×2): 1 via ORAL
  Filled 2018-08-17 (×2): qty 1

## 2018-08-17 MED ORDER — OXYTOCIN 40 UNITS IN NORMAL SALINE INFUSION - SIMPLE MED
INTRAVENOUS | Status: AC
  Filled 2018-08-17: qty 1000

## 2018-08-17 MED ORDER — KETOROLAC TROMETHAMINE 30 MG/ML IJ SOLN
30.0000 mg | Freq: Once | INTRAMUSCULAR | Status: AC | PRN
  Administered 2018-08-17: 30 mg via INTRAVENOUS

## 2018-08-17 MED ORDER — BUPIVACAINE IN DEXTROSE 0.75-8.25 % IT SOLN
INTRATHECAL | Status: DC | PRN
  Administered 2018-08-17: 1.6 mL via INTRATHECAL

## 2018-08-17 MED ORDER — IBUPROFEN 800 MG PO TABS
800.0000 mg | ORAL_TABLET | Freq: Three times a day (TID) | ORAL | Status: DC
  Administered 2018-08-17 – 2018-08-20 (×8): 800 mg via ORAL
  Filled 2018-08-17 (×8): qty 1

## 2018-08-17 MED ORDER — SIMETHICONE 80 MG PO CHEW
80.0000 mg | CHEWABLE_TABLET | Freq: Three times a day (TID) | ORAL | Status: DC
  Administered 2018-08-17 – 2018-08-19 (×7): 80 mg via ORAL
  Filled 2018-08-17 (×7): qty 1

## 2018-08-17 MED ORDER — MORPHINE SULFATE (PF) 0.5 MG/ML IJ SOLN
INTRAMUSCULAR | Status: DC | PRN
  Administered 2018-08-17: .15 mg via INTRATHECAL

## 2018-08-17 MED ORDER — SENNOSIDES-DOCUSATE SODIUM 8.6-50 MG PO TABS
2.0000 | ORAL_TABLET | ORAL | Status: DC
  Administered 2018-08-17 – 2018-08-19 (×3): 2 via ORAL
  Filled 2018-08-17 (×3): qty 2

## 2018-08-17 MED ORDER — MORPHINE SULFATE (PF) 4 MG/ML IV SOLN: 1.0000 mg | INTRAVENOUS | Status: DC | PRN

## 2018-08-17 MED ORDER — ACETAMINOPHEN 500 MG PO TABS
1000.0000 mg | ORAL_TABLET | Freq: Four times a day (QID) | ORAL | Status: DC | PRN
  Administered 2018-08-17 – 2018-08-19 (×2): 1000 mg via ORAL
  Filled 2018-08-17 (×2): qty 2

## 2018-08-17 MED ORDER — OXYTOCIN 40 UNITS IN NORMAL SALINE INFUSION - SIMPLE MED: 2.5000 [IU]/h | INTRAVENOUS | Status: AC

## 2018-08-17 MED ORDER — WITCH HAZEL-GLYCERIN EX PADS: 1.0000 "application " | MEDICATED_PAD | CUTANEOUS | Status: DC | PRN

## 2018-08-17 MED ORDER — PHENYLEPHRINE HCL-NACL 20-0.9 MG/250ML-% IV SOLN
INTRAVENOUS | Status: DC | PRN
  Administered 2018-08-17: 60 ug/min via INTRAVENOUS

## 2018-08-17 MED ORDER — ONDANSETRON HCL 4 MG/2ML IJ SOLN
INTRAMUSCULAR | Status: DC | PRN
  Administered 2018-08-17: 4 mg via INTRAVENOUS

## 2018-08-17 MED ORDER — NALBUPHINE HCL 10 MG/ML IJ SOLN: 5.0000 mg | INTRAMUSCULAR | Status: DC | PRN

## 2018-08-17 MED ORDER — SIMETHICONE 80 MG PO CHEW
80.0000 mg | CHEWABLE_TABLET | ORAL | Status: DC
  Administered 2018-08-17 – 2018-08-19 (×3): 80 mg via ORAL
  Filled 2018-08-17 (×3): qty 1

## 2018-08-17 MED ORDER — FENTANYL CITRATE (PF) 100 MCG/2ML IJ SOLN
INTRAMUSCULAR | Status: AC
  Filled 2018-08-17: qty 2

## 2018-08-17 MED ORDER — GABAPENTIN 100 MG PO CAPS
100.0000 mg | ORAL_CAPSULE | Freq: Three times a day (TID) | ORAL | Status: DC
  Administered 2018-08-17 – 2018-08-19 (×7): 100 mg via ORAL
  Filled 2018-08-17 (×7): qty 1

## 2018-08-17 MED ORDER — KETOROLAC TROMETHAMINE 30 MG/ML IJ SOLN
INTRAMUSCULAR | Status: AC
  Filled 2018-08-17: qty 1

## 2018-08-17 MED ORDER — LACTATED RINGERS IV SOLN
INTRAVENOUS | Status: DC
  Administered 2018-08-17 (×2): via INTRAVENOUS

## 2018-08-17 MED ORDER — PHENYLEPHRINE HCL-NACL 20-0.9 MG/250ML-% IV SOLN
INTRAVENOUS | Status: AC
  Filled 2018-08-17: qty 250

## 2018-08-17 MED ORDER — SIMETHICONE 80 MG PO CHEW: 80.0000 mg | CHEWABLE_TABLET | ORAL | Status: DC | PRN

## 2018-08-17 MED ORDER — MENTHOL 3 MG MT LOZG: 1.0000 | LOZENGE | OROMUCOSAL | Status: DC | PRN

## 2018-08-17 SURGICAL SUPPLY — 30 items
CHLORAPREP W/TINT 26ML (MISCELLANEOUS) ×3 IMPLANT
CLAMP CORD UMBIL (MISCELLANEOUS) IMPLANT
DRSG OPSITE POSTOP 4X10 (GAUZE/BANDAGES/DRESSINGS) ×3 IMPLANT
ELECT REM PT RETURN 9FT ADLT (ELECTROSURGICAL) ×3
ELECTRODE REM PT RTRN 9FT ADLT (ELECTROSURGICAL) ×1 IMPLANT
EXTRACTOR VACUUM M CUP 4 TUBE (SUCTIONS) IMPLANT
EXTRACTOR VACUUM M CUP 4' TUBE (SUCTIONS)
GLOVE BIOGEL PI IND STRL 6.5 (GLOVE) ×1 IMPLANT
GLOVE BIOGEL PI IND STRL 7.0 (GLOVE) ×1 IMPLANT
GLOVE BIOGEL PI INDICATOR 6.5 (GLOVE) ×2
GLOVE BIOGEL PI INDICATOR 7.0 (GLOVE) ×2
GLOVE SURG SS PI 6.0 STRL IVOR (GLOVE) ×3 IMPLANT
GOWN STRL REUS W/TWL LRG LVL3 (GOWN DISPOSABLE) ×6 IMPLANT
KIT ABG SYR 3ML LUER SLIP (SYRINGE) IMPLANT
NEEDLE HYPO 25X5/8 SAFETYGLIDE (NEEDLE) IMPLANT
NS IRRIG 1000ML POUR BTL (IV SOLUTION) ×3 IMPLANT
PACK C SECTION WH (CUSTOM PROCEDURE TRAY) ×3 IMPLANT
PAD ABD 7.5X8 STRL (GAUZE/BANDAGES/DRESSINGS) ×6 IMPLANT
PAD OB MATERNITY 4.3X12.25 (PERSONAL CARE ITEMS) ×3 IMPLANT
PENCIL SMOKE EVAC W/HOLSTER (ELECTROSURGICAL) ×3 IMPLANT
RETRACTOR TRAXI PANNICULUS (MISCELLANEOUS) ×1 IMPLANT
RTRCTR C-SECT PINK 25CM LRG (MISCELLANEOUS) IMPLANT
SEPRAFILM MEMBRANE 5X6 (MISCELLANEOUS) IMPLANT
SUT PLAIN 0 NONE (SUTURE) IMPLANT
SUT VIC AB 0 CT1 36 (SUTURE) ×12 IMPLANT
SUT VIC AB 4-0 KS 27 (SUTURE) ×3 IMPLANT
TOWEL OR 17X24 6PK STRL BLUE (TOWEL DISPOSABLE) ×3 IMPLANT
TRAXI PANNICULUS RETRACTOR (MISCELLANEOUS) ×2
TRAY FOLEY W/BAG SLVR 14FR LF (SET/KITS/TRAYS/PACK) ×3 IMPLANT
WATER STERILE IRR 1000ML POUR (IV SOLUTION) ×3 IMPLANT

## 2018-08-17 NOTE — Progress Notes (Signed)
Attempted to ambulate pt and get orthostatic vitals, patient sat at side of bed and was nauseas and became pale and not feeling good. Will attempt later with next assessment. Encouraged fluids po.

## 2018-08-17 NOTE — Anesthesia Postprocedure Evaluation (Signed)
Anesthesia Post Note  Patient: Eliezer Mccoy  Procedure(s) Performed: CESAREAN SECTION (N/A )     Patient location during evaluation: PACU Anesthesia Type: Spinal Level of consciousness: awake and alert, oriented and patient cooperative Pain management: pain level controlled Vital Signs Assessment: post-procedure vital signs reviewed and stable Respiratory status: spontaneous breathing, nonlabored ventilation and respiratory function stable Cardiovascular status: blood pressure returned to baseline and stable Postop Assessment: no apparent nausea or vomiting, patient able to bend at knees, spinal receding and no headache Anesthetic complications: no    Last Vitals:  Vitals:   08/17/18 1200 08/17/18 1230  BP: 125/77 126/79  Pulse: (!) 47 (!) 47  Resp: 16 20  Temp:    SpO2: 99% 99%    Last Pain:  Vitals:   08/17/18 1230  TempSrc:   PainSc: 4    Pain Goal:    LLE Motor Response: Purposeful movement (08/17/18 1230)   RLE Motor Response: Purposeful movement (08/17/18 1230)       Epidural/Spinal Function Cutaneous sensation: Tingles (08/17/18 1200), Patient able to flex knees: No (08/17/18 1200), Patient able to lift hips off bed: No (08/17/18 1200), Back pain beyond tenderness at insertion site: No (08/17/18 1200), Progressively worsening motor and/or sensory loss: No (08/17/18 1200), Bowel and/or bladder incontinence post epidural: No (08/17/18 1200)  Irania Durell,E. Albi Rappaport

## 2018-08-17 NOTE — Lactation Note (Signed)
This note was copied from a baby's chart. Lactation Consultation Note  Patient Name: Lindsey Wells [id number] Date: 08/17/2018 Reason for consult: Initial assessment;Other (Comment);Term(AMA)  9 hours old FT female who is being exclusively BF by his mother she's a P4. Mom is experienced BF, she was able to BF her first baby for 3 months, she didn't BF her second one (loss) and her third one for 6 months. She plans to BF this baby for about a year, but chooses to do both, breast and formula due to history of low milk supply. Mom had minimal breast changes during this pregnancy, and also on her previous pregnancies, she told LC she had to supplement because she couldn't get a "full supply". When [facility] assisted mom with hand expression, noticed that her breast are very spaced apart, and have a cone shape with large nipples and very little fatty tissue possibly due to hypoplastic breasts. LC had to stop when trying hand expression because mom's breast were very sensitive; but she was able to get droplets of colostrum.  She participated in the [facility] program at the [facility], but she doesn't have a pump at home. LC offered a hand pump from the hospital, but also set her up with a DEBP due to her Hx. Instructions, cleaning and storage were reviewed, as well as milk storage guidelines. When LC offered latch assistance mom politely declined, baby was asleep and not ready to feed. Then NT came to do baby's hearing test. Asked mom to call for assistance when needed. Reviewed normal newborn behavior, feeding cues, pumping and induction of lactation.  Feeding plan:  1. Encouraged mom to feed baby STS 8-12 times/24 hours or sooner if feeding cues are present 2. Mom will pump every 3 hours after feedings, at least 6-8 times/24 hours  BF brochure (SP) and feeding diary (SP) were reviewed. Parents reported all questions and concerns were answered, they're both aware of LC services and will call PRN.   Maternal  Data Formula Feeding for Exclusion: Yes Reason for exclusion: Mother's choice to formula and breast feed on admission Has patient been taught Hand Expression?: Yes Does the patient have breastfeeding experience prior to this delivery?: Yes  Feeding    Interventions Interventions: Breast feeding basics reviewed;Hand pump;DEBP;Breast compression;Hand express;Breast massage  Lactation Tools Discussed/Used Tools: Pump Breast pump type: Double-Electric Breast Pump;Manual WIC Program: Yes Pump Review: Setup, frequency, and cleaning;Milk Storage Initiated by:: MPeck Date initiated:: 08/17/18   Consult Status Consult Status: Follow-up Date: 08/18/18 Follow-up type: In-patient    Hollyanne Schloesser Venetia Constable 08/17/2018, 7:59 PM

## 2018-08-17 NOTE — Transfer of Care (Signed)
Immediate Anesthesia Transfer of Care Note  Patient: Lindsey Wells  Procedure(s) Performed: CESAREAN SECTION (N/A )  Patient Location: PACU  Anesthesia Type:Spinal  Level of Consciousness: awake and alert   Airway & Oxygen Therapy: Patient Spontanous Breathing  Post-op Assessment: Report given to RN and Post -op Vital signs reviewed and stable  Post vital signs: Reviewed  Last Vitals:  Vitals Value Taken Time  BP 124/69 08/17/2018 10:49 AM  Temp    Pulse 51 08/17/2018 10:52 AM  Resp 14 08/17/2018 10:52 AM  SpO2 99 % 08/17/2018 10:52 AM  Vitals shown include unvalidated device data.  Last Pain:  Vitals:   08/17/18 0748  TempSrc: Oral         Complications: No apparent anesthesia complications

## 2018-08-17 NOTE — Anesthesia Preprocedure Evaluation (Signed)
Anesthesia Evaluation  Patient identified by MRN, date of birth, ID band Patient awake    Reviewed: Allergy & Precautions, NPO status , Patient's Chart, lab work & pertinent test results  History of Anesthesia Complications Negative for: history of anesthetic complications  Airway Mallampati: II  TM Distance: >3 FB Neck ROM: Full    Dental  (+) Dental Advisory Given   Pulmonary former smoker,    breath sounds clear to auscultation       Cardiovascular negative cardio ROS   Rhythm:Regular Rate:Normal     Neuro/Psych negative neurological ROS     GI/Hepatic Neg liver ROS, GERD  Medicated and Poorly Controlled,  Endo/Other  obese  Renal/GU negative Renal ROS     Musculoskeletal   Abdominal (+) + obese,   Peds  Hematology plt 164k   Anesthesia Other Findings   Reproductive/Obstetrics (+) Pregnancy                             Anesthesia Physical Anesthesia Plan  ASA: II  Anesthesia Plan: Spinal   Post-op Pain Management:    Induction:   PONV Risk Score and Plan: 2 and Ondansetron, Dexamethasone and Scopolamine patch - Pre-op  Airway Management Planned: Natural Airway  Additional Equipment:   Intra-op Plan:   Post-operative Plan:   Informed Consent: I have reviewed the patients History and Physical, chart, labs and discussed the procedure including the risks, benefits and alternatives for the proposed anesthesia with the patient or authorized representative who has indicated his/her understanding and acceptance.     Dental advisory given  Plan Discussed with: CRNA and Surgeon  Anesthesia Plan Comments:         Anesthesia Quick Evaluation

## 2018-08-17 NOTE — Op Note (Signed)
Lindsey Wells PROCEDURE DATE: 08/17/2018  PREOPERATIVE DIAGNOSIS: Intrauterine pregnancy at  [redacted]w[redacted]d weeks gestation; previous uterine incision kerr x2  POSTOPERATIVE DIAGNOSIS: The same  PROCEDURE:     Cesarean Section  SURGEON:  Dr. Catalina Antigua  ASSISTANT: Dr. Adah Salvage  INDICATIONS: Lindsey Wells is a 38 y.o. Lindsey Wells at [redacted]w[redacted]d scheduled for cesarean section secondary to previous uterine incision kerr x2.  The risks of cesarean section discussed with the patient included but were not limited to: bleeding which may require transfusion or reoperation; infection which may require antibiotics; injury to bowel, bladder, ureters or other surrounding organs; injury to the fetus; need for additional procedures including hysterectomy in the event of a life-threatening hemorrhage; placental abnormalities wth subsequent pregnancies, incisional problems, thromboembolic phenomenon and other postoperative/anesthesia complications. The patient concurred with the proposed plan, giving informed written consent for the procedure.    FINDINGS:  Viable female infant in cephalic presentation.  Apgars 8 and 9.  Clear amniotic fluid.  Intact placenta, three vessel cord.  Normal uterus, fallopian tubes and ovaries bilaterally.  ANESTHESIA:    Spinal INTRAVENOUS FLUIDS:2600 ml ESTIMATED BLOOD LOSS: 380 mL ml URINE OUTPUT:  200 ml SPECIMENS: Placenta sent to L&D COMPLICATIONS: None immediate  PROCEDURE IN DETAIL:  The patient received intravenous antibiotics and had sequential compression devices applied to her lower extremities while in the preoperative area.  She was then taken to the operating room where anesthesia was induced and was found to be adequate. A foley catheter was placed into her bladder and attached to Lindsey Wells gravity. She was then placed in a dorsal supine position with a leftward tilt, and prepped and draped in a sterile manner. After an adequate timeout was performed,  a Pfannenstiel skin incision was made with scalpel and carried through to the underlying layer of fascia. The fascia was incised in the midline and this incision was extended bilaterally using the Mayo scissors. Kocher clamps were applied to the superior aspect of the fascial incision and the underlying rectus muscles were dissected off bluntly. A similar process was carried out on the inferior aspect of the facial incision. The rectus muscles were separated in the midline bluntly and the peritoneum was entered bluntly. The Alexis self-retaining retractor was introduced into the abdominal cavity. Attention was turned to the lower uterine segment where a bladder flap was created, and a transverse hysterotomy was made with a scalpel and extended bilaterally bluntly. The infant was successfully delivered. Delayed cord clamping was performed for 1 minute. The cord was clamped and cut and infant was handed over to awaiting neonatology team. Uterine massage was then administered and the placenta delivered intact with three-vessel cord. The uterus was cleared of clot and debris.  The hysterotomy was closed with 0 Vicryl in a running locked fashion, and an imbricating layer was also placed with a 0 Vicryl. Overall, excellent hemostasis was noted. The pelvis copiously irrigated and cleared of all clot and debris. Hemostasis was confirmed on all surfaces.  The peritoneum and the muscles were reapproximated using 0 vicryl interrupted stitches. The fascia was then closed using 0 Vicryl in a running fashion.  The skin was closed in a subcuticular fashion using 3.0 Vicryl. The patient tolerated the procedure well. Sponge, lap, instrument and needle counts were correct x 2. She was taken to the recovery room in stable condition.    Lindsey Wells  08/17/2018 11:01 AM

## 2018-08-17 NOTE — Anesthesia Procedure Notes (Signed)
Spinal  Patient location during procedure: OR End time: 08/17/2018 9:29 AM Staffing Anesthesiologist: Jairo Ben, MD Performed: anesthesiologist  Preanesthetic Checklist Completed: patient identified, surgical consent, pre-op evaluation, timeout performed, IV checked, risks and benefits discussed and monitors and equipment checked Spinal Block Patient position: sitting Prep: site prepped and draped and DuraPrep Patient monitoring: blood pressure, continuous pulse ox and heart rate Approach: midline Location: L3-4 Injection technique: single-shot Needle Needle type: Pencan  Needle gauge: 24 G Needle length: 9 cm Additional Notes Pt identified in Operating room.  Monitors applied. Working IV access confirmed. Sterile prep, drape lumbar spine.  1% lido local L 3,4.  #24ga Pencan into clear CSF L 3,4.  12mg  0.75% Bupivacaine with dextrose injected with asp CSF beginning and end of injection.  Patient asymptomatic, VSS, no heme aspirated, tolerated well.  Sandford Craze, MD

## 2018-08-17 NOTE — H&P (Signed)
Tiyonna I Masse is a 38 y.o. female [phone] at 89 weeks presenting for scheduled repeat cesarean section. Patient reports doing well and is without any complaints, reporting good fetal movement. Patient with prenatal care at [facility] complicated by a AMA, history of preterm birth for which she received weekly 17-P injections until 32 weeks (patient self discontinued), 2 previous cesarean section and language barrier.  OB History    Gravida  4   Para  3   Term  2   Preterm  1   AB      Living  2     SAB  0   TAB  0   Ectopic  0   Multiple  0   Live Births  3          Past Medical History:  Diagnosis Date  . Preterm labor    Past Surgical History:  Procedure Laterality Date  . CESAREAN SECTION N/A 01/01/2016   Procedure: CESAREAN SECTION;  Surgeon: Reva Bores, MD;  Location: [facility] BIRTHING SUITES;  Service: Obstetrics;  Laterality: N/A;  . CESAREAN SECTION N/A 12/20/2016   Procedure: REPEAT CESAREAN SECTION;  Surgeon: Tereso Newcomer, MD;  Location: [facility] BIRTHING SUITES;  Service: Obstetrics;  Laterality: N/A;   Family History: family history includes Diabetes in her father; Hypertension in her father. Social History:  reports that she has quit smoking. She has never used smokeless tobacco. She reports that she does not drink alcohol or use drugs.     Maternal Diabetes: No Genetic Screening: Normal Maternal Ultrasounds/Referrals: Normal Fetal Ultrasounds or other Referrals:  None Maternal Substance Abuse:  No Significant Maternal Medications:  None Significant Maternal Lab Results:  None Other Comments:  None  ROS  See pertinent in HPI History   Blood pressure 138/90, pulse (!) 56, temperature 98.6 F (37 C), temperature source Oral, resp. rate 16, height 5\' 5"  (1.651 m), weight 101.2 kg, last menstrual period 11/01/2017, currently breastfeeding. Exam Physical Exam  GENERAL: Well-developed, well-nourished female in no acute distress.  LUNGS: Clear to  auscultation bilaterally.  HEART: Regular rate and rhythm. ABDOMEN: Soft, nontender, gravid PELVIC: Not indicated EXTREMITIES: No cyanosis, clubbing, or edema, 2+ distal pulses.  Prenatal labs: ABO, Rh: --/--/AB POS, AB POS (03/19 0932) Antibody: NEG (03/19 0932) Rubella: 21.00 (09/18 1154) RPR: Non Reactive (03/19 0950)  HBsAg: Negative (09/18 1154)  HIV: Non Reactive (01/07 0818)  GBS:   negative (08/01/2018)  Assessment/Plan: 38 yo G4P212 At 39 weeks here for scheduled repeat cesarean section - Risks, benefits and alternatives were explained to the patient including but not limited to risks of bleeding, infection, damage to adjacent organs. Patient verbalized understanding and all questions were answered - Patient plans Paraguard IUD for contraception  Louna Rothgeb 08/17/2018, 8:13 AM

## 2018-08-18 ENCOUNTER — Encounter (HOSPITAL_COMMUNITY): Payer: Self-pay | Admitting: Obstetrics and Gynecology

## 2018-08-18 LAB — CBC
HCT: 30.6 % — ABNORMAL LOW (ref 36.0–46.0)
Hemoglobin: 10.6 g/dL — ABNORMAL LOW (ref 12.0–15.0)
MCH: 29.4 pg (ref 26.0–34.0)
MCHC: 34.6 g/dL (ref 30.0–36.0)
MCV: 85 fL (ref 80.0–100.0)
Platelets: 134 10*3/uL — ABNORMAL LOW (ref 150–400)
RBC: 3.6 MIL/uL — ABNORMAL LOW (ref 3.87–5.11)
RDW: 14.2 % (ref 11.5–15.5)
WBC: 8.2 10*3/uL (ref 4.0–10.5)
nRBC: 0 % (ref 0.0–0.2)

## 2018-08-18 NOTE — Progress Notes (Signed)
Post Op Day #1 Subjective: no complaints, up ad lib, voiding, tolerating PO and + flatus  Objective: Blood pressure (!) 120/59, pulse (!) 50, temperature 98.2 F (36.8 C), resp. rate 18, height 5\' 5"  (1.651 m), weight 101.2 kg, last menstrual period 11/01/2017, SpO2 98 %, currently breastfeeding.  Physical Exam:  General: alert, cooperative and no distress Lochia: appropriate Uterine Fundus: firm Incision: intact, dry pressure dressing in place DVT Evaluation: No cords or calf tenderness. No significant calf/ankle edema.  Recent Labs    08/17/18 1505 08/18/18 0650  HGB 12.0 10.6*  HCT 35.3* 30.6*    Assessment/Plan: Breastfeeding, Lactation consult and Contraception - Paraguard  Continue routine post-partum care. Plan for discharge tomorrow or 3/24.    LOS: 1 day    Burman Nieves, MD Family Medicine Resident  08/18/2018, 8:09 AM

## 2018-08-19 ENCOUNTER — Encounter (HOSPITAL_COMMUNITY): Payer: Self-pay | Admitting: *Deleted

## 2018-08-19 MED ORDER — OXYCODONE-ACETAMINOPHEN 5-325 MG PO TABS: 1.0000 | ORAL_TABLET | Freq: Four times a day (QID) | ORAL | 0 refills | Status: AC | PRN

## 2018-08-19 MED ORDER — IBUPROFEN 800 MG PO TABS: 800.0000 mg | ORAL_TABLET | Freq: Three times a day (TID) | ORAL | 0 refills | Status: DC

## 2018-08-19 MED ORDER — SENNOSIDES-DOCUSATE SODIUM 8.6-50 MG PO TABS: 2.0000 | ORAL_TABLET | ORAL | 0 refills | Status: DC

## 2018-08-19 MED ORDER — TETANUS-DIPHTH-ACELL PERTUSSIS 5-2.5-18.5 LF-MCG/0.5 IM SUSP: 0.5000 mL | Freq: Once | INTRAMUSCULAR | 0 refills | Status: AC

## 2018-08-19 NOTE — Discharge Summary (Addendum)
Obstetrics Discharge Summary OB/GYN Faculty [facility]   Patient Name: Lindsey Wells DOB: May 10, 1981 MRN: [id number]  Date of admission: 08/17/2018 Delivering MD: Catalina Antigua   Date of discharge: 08/19/2018  Admitting diagnosis: rcs Intrauterine pregnancy: [redacted]w[redacted]d     Secondary diagnosis:   Active Problems:   S/P cesarean section   Additional problems:  . AMA . History of c-section     Discharge diagnosis: Term Pregnancy Delivered                                            Postpartum procedures: None  Complications: None  Outpatient Follow-Up: [x]  2 week incision check  Hospital course: Lindsey Wells is a 38 y.o. [redacted]w[redacted]d who was admitted for scheduled repeat c-section. Her pregnancy was complicated by AMA, history of preterm birth for which she received weekly 17-P injections until 32 weeks (patient self discontinued), 2 previous c-sections, and language barrier. Delivery was uncomplicated. Please see delivery/op note for additional details. Her postpartum course was uncomplicated. She was breastfeeding without difficulty. By day of discharge, she was passing flatus, urinating, eating and drinking without difficulty. Her pain was well-controlled, and she was discharged home with Ibuprofen 800mg  and Percocet 5-325. She will follow-up in clinic in 2 weeks for incision check and 4-6 weeks for postpartum visit.   Physical exam  Vitals:   08/18/18 0800 08/18/18 1426 08/18/18 2100 08/19/18 0529  BP: 114/73 126/66 132/68 130/73  Pulse: (!) 55 62 (!) 53 (!) 53  Resp: 20 17 18 18   Temp: 97.9 F (36.6 C) 98.5 F (36.9 C) 98.6 F (37 C) 98.3 F (36.8 C)  TempSrc: Axillary Oral Oral   SpO2: 97% 96%  97%  Weight:      Height:       General: Well appearing, lying comfortably in bed with baby at bedside, no acute distress Lochia: appropriate Uterine Fundus: firm Incision: Healing well with no significant drainage. Some old dried blood on honeycomb without  evidence of continued bleeding.  DVT Evaluation: No evidence of DVT seen on physical exam. No cords or calf tenderness. No significant calf/ankle edema. Labs: Lab Results  Component Value Date   WBC 8.2 08/18/2018   HGB 10.6 (L) 08/18/2018   HCT 30.6 (L) 08/18/2018   MCV 85.0 08/18/2018   PLT 134 (L) 08/18/2018   CMP Latest Ref Rng & Units 08/17/2018  Glucose 70 - 99 mg/dL 73  BUN 6 - 20 mg/dL 5(L)  Creatinine 7.98 - 1.00 mg/dL 9.21  Sodium 194 - 174 mmol/L 137  Potassium 3.5 - 5.1 mmol/L 4.0  Chloride 98 - 111 mmol/L 109  CO2 22 - 32 mmol/L 21(L)  Calcium 8.9 - 10.3 mg/dL 0.8(X)  Total Protein 6.5 - 8.1 g/dL 5.0(L)  Total Bilirubin 0.3 - 1.2 mg/dL 0.3  Alkaline Phos 38 - 126 U/L 146(H)  AST 15 - 41 U/L 21  ALT 0 - 44 U/L 14    Discharge instructions: Per After Visit Summary and "Baby and Me Booklet"  After visit meds:  Allergies as of 08/19/2018   No Known Allergies     Medication List    STOP taking these medications   aspirin EC 81 MG tablet   terconazole 0.4 % vaginal cream Commonly known as:  TERAZOL 7     TAKE these medications   acetaminophen 325 MG tablet Commonly known as:  TYLENOL  Take 650 mg by mouth every 6 (six) hours as needed for moderate pain.   ibuprofen 800 MG tablet Commonly known as:  ADVIL,MOTRIN Take 1 tablet (800 mg total) by mouth every 8 (eight) hours.   multivitamin-prenatal 27-0.8 MG Tabs tablet Take 1 tablet by mouth daily.   oxyCODONE-acetaminophen 5-325 MG tablet Commonly known as:  Percocet Take 1 tablet by mouth every 6 (six) hours as needed for severe pain.   ranitidine 75 MG tablet Commonly known as:  ZANTAC Take 75 mg by mouth daily as needed for heartburn.   senna-docusate 8.6-50 MG tablet Commonly known as:  Senokot-S Take 2 tablets by mouth daily. Start taking on:  August 20, 2018   Tdap 5-2.5-18.5 LF-MCG/0.5 injection Commonly known as:  BOOSTRIX Inject 0.5 mLs into the muscle once for 1 dose.             Discharge Care Instructions  (From admission, onward)         Start     Ordered   08/19/18 0000  Discharge wound care:    Comments:  Remove honeycomb dressing in 4-7 days. Dressing is able to get wet.   08/19/18 1102          Postpartum contraception: IUD Paragard Diet: Routine Diet Activity: Advance as tolerated. Pelvic rest for 6 weeks.   Follow-up Appt:No future appointments. Follow-up Visit:No follow-ups on file.  Newborn Data: Live born female  Birth Weight: 6 lb 12.6 oz (3080 g) APGAR: 8, 9  Newborn Delivery   Birth date/time:  08/17/2018 10:02:00 Delivery type:  C-Section, Low Transverse Trial of labor:  No C-section categorization:  Repeat     Baby Feeding: Breast Disposition:home with mother  Orpah Cobb, DO [facility], PGY1     I confirm that I have verified the information documented in the resident student's note and that I have also personally reperformed the history, physical exam and all medical decision making activities of this service and have verified that all service and findings are accurately documented in this student's note.    Raelyn Mora, CNM 08/19/2018 11:06 AM

## 2018-08-19 NOTE — Discharge Instructions (Signed)
Parto por cesrea, cuidados posteriores  Cesarean Delivery, Care After  Esta hoja le brinda informacin sobre cmo cuidarse despus del procedimiento. El mdico tambin podr darle indicaciones ms especficas. Comunquese con el mdico si tiene problemas o preguntas.  Qu puedo esperar despus del procedimiento?  Despus del procedimiento, es comn tener los siguientes sntomas:   Una pequea cantidad de sangre o de lquido transparente que proviene de la incisin.   Enrojecimiento, hinchazn y dolor en la zona de la incisin.   Dolor y molestia abdominales.   Hemorragia vaginal (loquios). Aunque no haya tenido parto vaginal, tendr sangrado y secrecin vaginal.   Calambres plvicos.   Fatiga.  Es posible que sienta dolor, hinchazn y molestias en el tejido que se encuentra entre la vagina y el ano (perineo) en los siguientes casos:   Si la cesrea no fue planificada y le permitieron realizar el trabajo de parto y pujar.   Si le hicieron una incisin en la zona (episiotoma) o el tejido se desgarr durante el intento de parto vaginal.  Siga estas indicaciones en su casa:  Cuidados de la incisin     Siga las indicaciones del mdico acerca del cuidado de la incisin. Asegrese de hacer lo siguiente:  ? Lvese las manos con agua y jabn antes de cambiar las vendas (vendaje). Use desinfectante para manos si no dispone de agua y jabn.  ? Si tiene un vendaje, cmbielo o quteselo siguiendo las indicaciones del mdico.  ? No retire los puntos (suturas), las grapas cutneas, la goma para cerrar la piel o las tiras adhesivas. Es posible que estos cierres cutneos deban permanecer en la piel durante 2semanas o ms tiempo. Si los bordes de las tiras adhesivas empiezan a despegarse y enroscarse, puede recortar los que estn sueltos. No retire las tiras adhesivas por completo a menos que el mdico se lo indique.   Controle todos los das la zona de la incisin para detectar signos de infeccin. Est atento a los  siguientes signos:  ? Aumento del enrojecimiento, la hinchazn o el dolor.  ? Ms lquido o sangre.  ? Calor.  ? Pus o mal olor.   No tome baos de inmersin, no nade ni use un jacuzzi hasta que el mdico la autorice. Pregntele al mdico si puede ducharse.   Cuando tosa o estornude, abrace una almohada. Esto ayuda con el dolor y disminuye la posibilidad de que su incisin se abra (dehiscencia). Haga esto hasta que la incisin cicatrice.  Medicamentos   Tome los medicamentos de venta libre y los recetados solamente como se lo haya indicado el mdico.   Si le recetaron un antibitico, tmelo como se lo haya indicado el mdico. No deje de tomar el antibitico aunque comience a sentirse mejor.   No conduzca ni use maquinaria pesada mientras toma analgsicos recetados.  Estilo de vida   No beba alcohol. Esto es de suma importancia si est amamantando o toma analgsicos.   No consuma ningn producto que contenga nicotina o tabaco, como cigarrillos, cigarrillos electrnicos y tabaco de mascar. Si necesita ayuda para dejar de fumar, consulte al mdico.  Comida y bebida   Beba al menos 8vasos de ochoonzas (240cc) de agua todos los das a menos que el mdico le indique lo contrario. Si amamanta, quiz deba beber an ms cantidad de agua.   Coma alimentos ricos en fibras todos los das. Estos alimentos pueden ayudar a prevenir o aliviar el estreimiento. Los alimentos ricos en fibra incluyen los siguientes:  ?   Panes y cereales integrales.  ? Arroz integral.  ? Frijoles.  ? Frutas y verduras frescas.  Actividad     Si es posible, pdale a alguien que la ayude a cuidar del beb y con las tareas del hogar durante al menos algunos das despus de que le den el alta del hospital.   Retome sus actividades normales como se lo haya indicado el mdico. Pregntele al mdico qu actividades son seguras para usted.   Descanse todo lo que pueda. Trate de descansar o tomar una siesta mientras el beb duerme.   No levante  ningn objeto que pese ms de 10libras (4,5kg) o el lmite de peso que le hayan indicado, hasta que el mdico le diga que puede hacerlo.   Hable con el mdico sobre cundo puede retomar la actividad sexual. Esto puede depender de lo siguiente:  ? Riesgo de sufrir una infeccin.  ? La rapidez con que cicatrice.  ? Comodidad y deseo de retomar la actividad sexual.  Indicaciones generales   No use tampones ni se haga duchas vaginales hasta que el mdico la autorice.   Use ropa floja y cmoda y un sostn firme y que le calce bien.   Mantenga el perineo limpio y seco. Cuando vaya al bao, siempre higiencese de adelante hacia atrs.   Si expulsa un cogulo de sangre, gurdelo y llame al mdico para informrselo. No deseche los cogulos de sangre por el inodoro antes de recibir indicaciones del mdico.   Concurra a todas las visitas de seguimiento para usted y el beb, como se lo haya indicado el mdico. Esto es importante.  Comunquese con un mdico si:   Tiene los siguientes sntomas:  ? Fiebre.  ? Secrecin vaginal con mal olor.  ? Pus o mal olor en el lugar de la incisin.  ? Dificultad o dolor al orinar.  ? Aumento o disminucin repentinos de la frecuencia de las deposiciones.  ? Aumento del enrojecimiento, la hinchazn o el dolor alrededor de la incisin.  ? Aumento del lquido o sangre proveniente de la incisin.  ? Erupcin cutnea.  ? Nuseas.  ? Poco inters o falta de inters en actividades que solan gustarle.  ? Dudas sobre su cuidado y el del beb.   Su incisin se siente caliente al tacto.   Siente dolor en las mamas y se ponen rojas o duras.   Siente tristeza o preocupacin de forma inusual.   Vomita.   Elimina un cogulo de sangre grande por la vagina.   Orina ms de lo habitual.   Se siente mareado o aturdido.  Solicite ayuda inmediatamente si:   Tiene los siguientes sntomas:  ? Dolor que no desaparece o no mejora con medicamentos.  ? Dolor en el pecho.  ? Dificultad para  respirar.  ? Visin borrosa o manchas en la visin.  ? Pensamientos de autolesionarse o lesionar al beb.  ? Nuevo dolor en el abdomen o en una de las piernas.  ? Dolor de cabeza intenso.   Se desmaya.   Tiene una hemorragia tan intensa en la vagina que empapa ms de un apsito en una hora. El sangrado no debe ser ms abundante que el perodo ms intenso que haya tenido.  Resumen   Despus del procedimiento, es comn tener dolor en el lugar de la incisin, clicos abdominales, y sangrado vaginal leve.   Controle todos los das la zona de la incisin para detectar signos de infeccin.   Informe al mdico sobre cualquier sntoma

## 2018-08-19 NOTE — Lactation Note (Signed)
This note was copied from a baby's chart. Lactation Consultation Note  Patient Name: Boy Uchechukwu Lytton ONGEX'B Date: 08/19/2018 Reason for consult: Follow-up assessment;Term;Infant weight loss  G4P3 mother whose infant is now 95 hours old.  Mother breast fed her first child for 3 months and her third child for 6 months.  Her second child was a loss.  Spanish interpreter, Cecelia [phone]) used for interpretation.  Baby was asleep in bassinet when I arrived.  Mother's feeding choice is breast/bottle.  She stated that she was feeding every hour yesterday and now she is breast feeding and supplementing with formula.    Offered to assist with latching at any time but mother politely declined stating breast feeding "is going fine."  Encouraged to continue feeding 8-12 times/24 hours or sooner if baby shows cues.  Reminded mother to always put baby to the breast prior to supplementing with formula.  Suggested she pump every 3 hours or after breast feeding to increase her supply.  Mother stated that she was not pumping that often yesterday but today she is pumping every 3 hours.  She will call as needed for assistance.   Maternal Data Formula Feeding for Exclusion: Yes Reason for exclusion: Mother's choice to formula and breast feed on admission Has patient been taught Hand Expression?: Yes Does the patient have breastfeeding experience prior to this delivery?: Yes  Feeding Feeding Type: Breast Fed  LATCH Score Latch: Grasps breast easily, tongue down, lips flanged, rhythmical sucking.  Audible Swallowing: A few with stimulation  Type of Nipple: Everted at rest and after stimulation  Comfort (Breast/Nipple): Soft / non-tender  Hold (Positioning): Assistance needed to correctly position infant at breast and maintain latch.  LATCH Score: 8  Interventions Interventions: Adjust position  Lactation Tools Discussed/Used WIC Program: Yes Initiated by:: Already  initiated   Consult Status Consult Status: Follow-up Date: 08/20/18 Follow-up type: In-patient    Dora Sims 08/19/2018, 5:27 PM

## 2018-08-20 NOTE — Discharge Summary (Signed)
Postpartum Discharge Summary     Patient Name: Lindsey Wells DOB: 1980/11/23 MRN: [id number]  Date of admission: 08/17/2018 Delivering Provider: CONSTANT, Gigi Gin   Date of discharge: 08/20/2018  Admitting diagnosis: rcs Intrauterine pregnancy: [redacted]w[redacted]d     Secondary diagnosis:  Active Problems:   S/P cesarean section  Additional problems: none     Discharge diagnosis: Term Pregnancy Delivered                                                                                                Post partum procedures:rhogam  Augmentation: N/A  Complications: None  Hospital course:  Sceduled C/S   38 y.o. yo [id number] at [redacted]w[redacted]d was admitted to the hospital 08/17/2018 for scheduled cesarean section with the following indication:Elective Repeat.  Membrane Rupture Time/Date: 10:01 AM ,08/17/2018   Patient delivered a Viable infant.08/17/2018  Details of operation can be found in separate operative note.  Pateint had an uncomplicated postpartum course.  She is ambulating, tolerating a regular diet, passing flatus, and urinating well. Patient is discharged home in stable condition on  08/20/18         Magnesium Sulfate recieved: No BMZ received: No  Physical exam  Vitals:   08/18/18 2100 08/19/18 0529 08/19/18 1454 08/19/18 2156  BP: 132/68 130/73 132/75 135/84  Pulse: (!) 53 (!) 53 (!) 59 65  Resp: 18 18  18   Temp: 98.6 F (37 C) 98.3 F (36.8 C) 98.4 F (36.9 C) 98.5 F (36.9 C)  TempSrc: Oral  Oral   SpO2:  97% 97% 98%  Weight:      Height:       General: alert, cooperative and no distress Lochia: appropriate Uterine Fundus: firm Incision: Dressing is clean, dry, and intact DVT Evaluation: No evidence of DVT seen on physical exam. Labs: Lab Results  Component Value Date   WBC 8.2 08/18/2018   HGB 10.6 (L) 08/18/2018   HCT 30.6 (L) 08/18/2018   MCV 85.0 08/18/2018   PLT 134 (L) 08/18/2018   CMP Latest Ref Rng & Units 08/17/2018  Glucose 70 - 99 mg/dL 73  BUN 6  - 20 mg/dL 5(L)  Creatinine 5.39 - 1.00 mg/dL 7.67  Sodium 341 - 937 mmol/L 137  Potassium 3.5 - 5.1 mmol/L 4.0  Chloride 98 - 111 mmol/L 109  CO2 22 - 32 mmol/L 21(L)  Calcium 8.9 - 10.3 mg/dL 9.0(W)  Total Protein 6.5 - 8.1 g/dL 5.0(L)  Total Bilirubin 0.3 - 1.2 mg/dL 0.3  Alkaline Phos 38 - 126 U/L 146(H)  AST 15 - 41 U/L 21  ALT 0 - 44 U/L 14    Discharge instruction: per After Visit Summary and "Baby and Me Booklet".  After visit meds:  Allergies as of 08/20/2018   No Known Allergies     Medication List    STOP taking these medications   aspirin EC 81 MG tablet   terconazole 0.4 % vaginal cream Commonly known as:  TERAZOL 7     TAKE these medications   acetaminophen 325 MG tablet Commonly known as:  TYLENOL Take 650 mg by  mouth every 6 (six) hours as needed for moderate pain.   ibuprofen 800 MG tablet Commonly known as:  ADVIL,MOTRIN Take 1 tablet (800 mg total) by mouth every 8 (eight) hours.   multivitamin-prenatal 27-0.8 MG Tabs tablet Take 1 tablet by mouth daily.   oxyCODONE-acetaminophen 5-325 MG tablet Commonly known as:  Percocet Take 1 tablet by mouth every 6 (six) hours as needed for severe pain.   ranitidine 75 MG tablet Commonly known as:  ZANTAC Take 75 mg by mouth daily as needed for heartburn.   senna-docusate 8.6-50 MG tablet Commonly known as:  Senokot-S Take 2 tablets by mouth daily.     ASK your doctor about these medications   Tdap 5-2.5-18.5 LF-MCG/0.5 injection Commonly known as:  BOOSTRIX Inject 0.5 mLs into the muscle once for 1 dose. Ask about: Should I take this medication?            Discharge Care Instructions  (From admission, onward)         Start     Ordered   08/19/18 0000  Discharge wound care:    Comments:  Remove honeycomb dressing in 4-7 days. Dressing is able to get wet.   08/19/18 1102          Diet: routine diet  Activity: Advance as tolerated. Pelvic rest for 6 weeks.   Outpatient follow  up:2 weeks for incision check Follow up Appt: Future Appointments  Date Time Provider Department Center  09/02/2018 10:00 AM [facility] NURSE [facility]  09/20/2018  9:35 AM  Bing, MD [facility]   Follow up Visit: Follow-up Information    [facility] for [facility]. Schedule an appointment as soon as possible for a visit in 2 week(s).   Specialty:  Obstetrics and Gynecology Why:  for incision check  Please make an appointment for 4-6 weeks for postpartum visit Contact information: [address] [place] [address] [phone]           Please schedule this patient for Postpartum visit in: 6 weeks with the following provider: Any provider For C/S patients schedule nurse incision check in weeks 2 weeks: yes Low risk pregnancy complicated by: None Delivery mode:  CS Anticipated Birth Control:  Nexplanon PP Procedures needed: N/A  Schedule Integrated BH visit: no      Newborn Data: Live born female  Birth Weight: 6 lb 12.6 oz (3080 g) APGAR: 8, 9  Newborn Delivery   Birth date/time:  08/17/2018 10:02:00 Delivery type:  C-Section, Low Transverse Trial of labor:  No C-section categorization:  Repeat     Baby Feeding: Breast Disposition:home with mother   Clayton Bibles, [facility] 08/20/18  6:33 AM

## 2018-09-02 ENCOUNTER — Ambulatory Visit (INDEPENDENT_AMBULATORY_CARE_PROVIDER_SITE_OTHER): Payer: Self-pay | Admitting: General Practice

## 2018-09-02 ENCOUNTER — Other Ambulatory Visit: Payer: Self-pay

## 2018-09-02 VITALS — BP 115/73 | HR 57 | Ht 65.0 in | Wt 204.0 lb

## 2018-09-02 DIAGNOSIS — Z5189 Encounter for other specified aftercare: Secondary | ICD-10-CM

## 2018-09-02 NOTE — Progress Notes (Signed)
I have reviewed the chart and agree with nursing staff's documentation of this patient's encounter.  Thressa Sheller DNP, CNM  09/02/18  10:34 AM

## 2018-09-02 NOTE — Progress Notes (Signed)
Patient presents to office today for incision check following repeat c-section on 3/21. Patient reports doing well since surgery. Incision is clean, dry & intact- appears well healed. Reviewed wound care and signs & symptoms of infection with patient. Patient verbalized understanding to all & had no questions. Wendall Stade used for spanish interpreter.  Chase Caller RN BSN 09/02/18

## 2018-09-17 ENCOUNTER — Telehealth: Payer: Self-pay | Admitting: Family Medicine

## 2018-09-17 NOTE — Telephone Encounter (Signed)
With Eda as the interpreter patient was called and instructed her appointment would be changed to Thursday, and it would be a telephone visit.

## 2018-09-19 ENCOUNTER — Other Ambulatory Visit: Payer: Self-pay

## 2018-09-19 ENCOUNTER — Ambulatory Visit (INDEPENDENT_AMBULATORY_CARE_PROVIDER_SITE_OTHER): Payer: Self-pay

## 2018-09-19 MED ORDER — SENNOSIDES-DOCUSATE SODIUM 8.6-50 MG PO TABS: 2.0000 | ORAL_TABLET | ORAL | 0 refills | Status: DC

## 2018-09-19 MED ORDER — NORETHINDRONE 0.35 MG PO TABS: 1.0000 | ORAL_TABLET | Freq: Every day | ORAL | 11 refills | Status: DC

## 2018-09-19 NOTE — Progress Notes (Signed)
TELEHEALTH VIRTUAL POSTPARTUM VISIT ENCOUNTER NOTE  I connected with@ on 09/19/18 at  3:35 PM EDT by telephone at home and verified that I am speaking with the correct person using two identifiers. Spanish interpreter used   I discussed the limitations, risks, security and privacy concerns of performing an evaluation and management service by telephone and the availability of in person appointments. I also discussed with the patient that there may be a patient responsible charge related to this service. The patient expressed understanding and agreed to proceed.  Appointment Date: 09/19/2018  [facility]  Chief Complaint:  Chief Complaint  Patient presents with  . Postpartum Care    History of Present Illness: Lindsey Wells is a 38 y.o. Hispanic [phone] (No LMP recorded.), seen for the above chief complaint. Her past medical history is significant for hx of preterm delivery and 2 previous c/s   She is s/p repeat cesarean section on 3/21 at 39 weeks; she was discharged to home on POD#2. Pregnancy complicated by AMA, previous c/s x2, hx preterm delivery. Baby is doing well.  Complains of continued constipation and pain at incision site as she increases activity  Vaginal bleeding or discharge: No  Mode of feeding infant: Breast Intercourse: No  Contraception: IUD PP depression s/s: No .  Any bowel or bladder issues: No  Pap smear: no abnormalities (date: 09/2015)  Review of Systems: Positive for incision pain. Her 12 point review of systems is negative or as noted in the History of Present Illness.  Patient Active Problem List   Diagnosis Date Noted  . S/P cesarean section 08/17/2018  . AMA (advanced maternal age) multigravida 35+ 03/13/2018  . History of Uterine cervical insufficiency during pregnancy 02/15/2018  . Supervision of high risk pregnancy, antepartum, second trimester 02/13/2018  . History of preterm labor/delivery  02/13/2018  . Language barrier  11/06/2016  . History of cesarean delivery 08/23/2016    Medications Starlee I. Garcia-Diosdado had no medications administered during this visit. Current Outpatient Medications  Medication Sig Dispense Refill  . ibuprofen (ADVIL,MOTRIN) 800 MG tablet Take 1 tablet (800 mg total) by mouth every 8 (eight) hours. 60 tablet 0  . Prenatal Vit-Fe Fumarate-FA (MULTIVITAMIN-PRENATAL) 27-0.8 MG TABS tablet Take 1 tablet by mouth daily. 90 each 3  . acetaminophen (TYLENOL) 325 MG tablet Take 650 mg by mouth every 6 (six) hours as needed for moderate pain.     Marland Kitchen norethindrone (MICRONOR) 0.35 MG tablet Take 1 tablet (0.35 mg total) by mouth daily. 1 Package 11  . oxyCODONE-acetaminophen (PERCOCET) 5-325 MG tablet Take 1 tablet by mouth every 6 (six) hours as needed for severe pain. 20 tablet 0  . ranitidine (ZANTAC) 75 MG tablet Take 75 mg by mouth daily as needed for heartburn.     . senna-docusate (SENOKOT-S) 8.6-50 MG tablet Take 2 tablets by mouth daily. 30 tablet 0   No current facility-administered medications for this visit.     Allergies Patient has no known allergies.  Physical Exam:  General:  Alert, oriented and cooperative.   Mental Status: Normal mood and affect perceived. Normal judgment and thought content.  Rest of physical exam deferred due to type of encounter  PP Depression Screening:   Edinburgh Postnatal Depression Scale - 09/19/18 1454      Edinburgh Postnatal Depression Scale:  In the Past 7 Days   I have been able to laugh and see the funny side of things.  0    I have  looked forward with enjoyment to things.  0    I have blamed myself unnecessarily when things went wrong.  0    I have been anxious or worried for no good reason.  0    I have felt scared or panicky for no good reason.  0    Things have been getting on top of me.  0    I have been so unhappy that I have had difficulty sleeping.  0    I have felt sad or miserable.  0    I have been so unhappy that I  have been crying.  0    The thought of harming myself has occurred to me.  0    Edinburgh Postnatal Depression Scale Total  0       Assessment:Patient is a 38 y.o. [id number] who is 4 weeks postpartum from a repeat cesarean section.  She is doing well.   Plan: 1. Postpartum care and examination   -Rx for stool softener refilled -Patient planning IUD at health department, POPs prescribed in meantime. Discussed importance of taking at same time every day -Encouraged ibuprofen and gradual increase in activity for incision pain.   RTC 1 year for annual exam or sooner if needed  I discussed the assessment and treatment plan with the patient. The patient was provided an opportunity to ask questions and all were answered. The patient agreed with the plan and demonstrated an understanding of the instructions.   The patient was advised to call back or seek an in-person evaluation/go to the ED for any concerning postpartum symptoms.  I provided 8 minutes of non-face-to-face time during this encounter.   Rolm Bookbinderaroline M Ruble Pumphrey, CNM [facility], [facility]

## 2018-09-19 NOTE — Progress Notes (Signed)
[employer]ter # [phone] Melody and  I connected with  Lindsey Wells on 09/19/18 at  2:48 PM EDT by telephone and verified that I am speaking with the correct person using two identifiers.   I discussed the limitations, risks, security and privacy concerns of performing an evaluation and management service by telephone and the availability of in person appointments. I also discussed with the patient that there may be a patient responsible charge related to this service. The patient expressed understanding and agreed to proceed.  Janene Madeira Shakera Ebrahimi, CMA 09/19/2018  2:47 PM  Pt and baby are doing well, she states she's still constipated. Needs a refill of the Senokot. Pharmacy on file correct   Patient Is interested in IUD but needs pills until she can get to the health department to have it done, because she doesn't have insurance.   Still having pain at c section scar / advise to take tylenol or ibuprofen.   Pt wants to start exercising again, advised to take it easy.  Needs information on how to get baby's birth certificates.

## 2018-09-20 ENCOUNTER — Telehealth: Payer: Self-pay | Admitting: Obstetrics and Gynecology

## 2019-10-07 IMAGING — US US MFM OB TRANSVAGINAL
1 series · 13 of 28 positions shown · non-contrast
Comparison: none

[Series 1: us mfm ob transvaginal · 85 acquisitions, 13 frames shown]
[im 4/85]
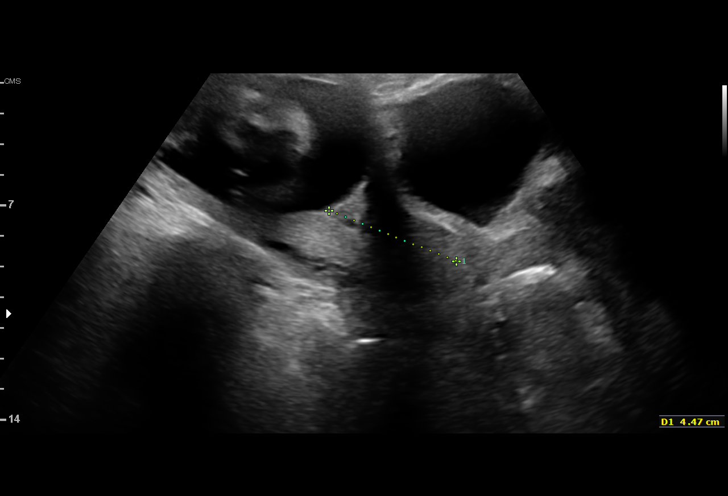
[im 10/85]
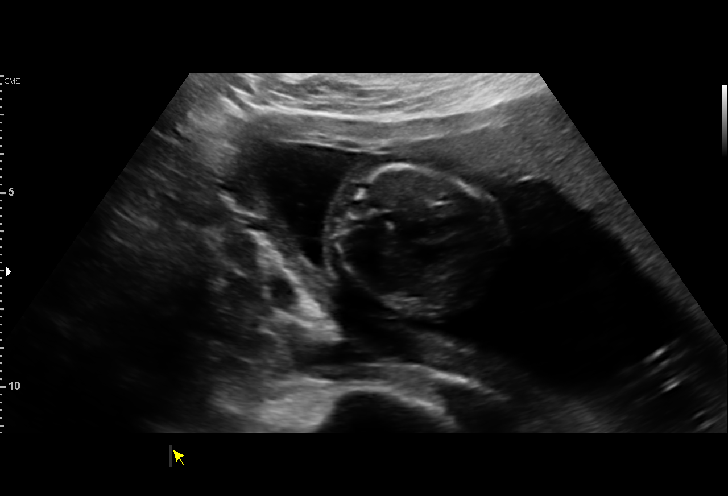
[im 16/85]
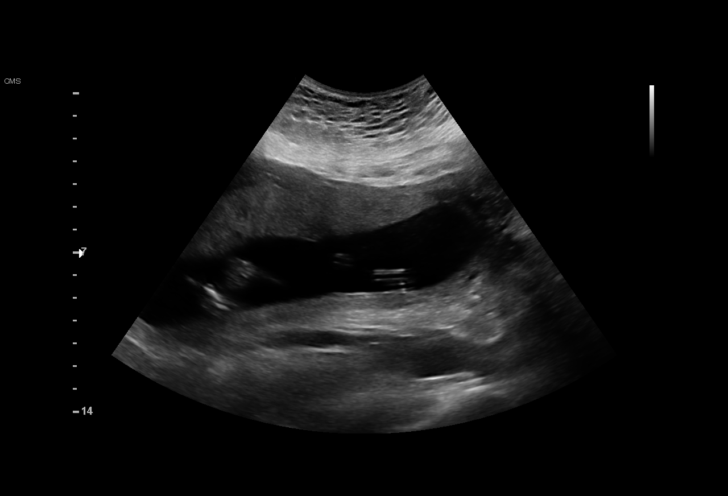
[im 22/85]
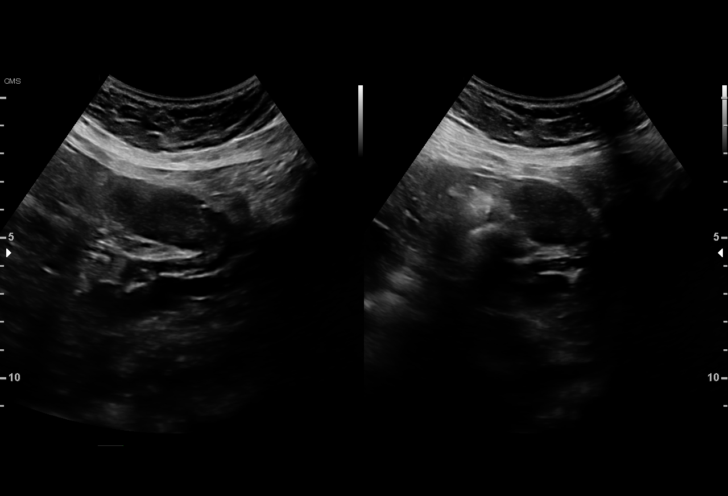
[im 29/85]
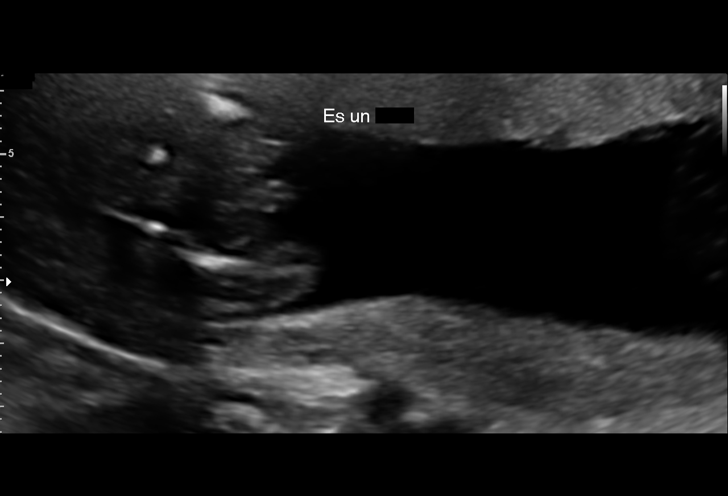
[im 35/85]
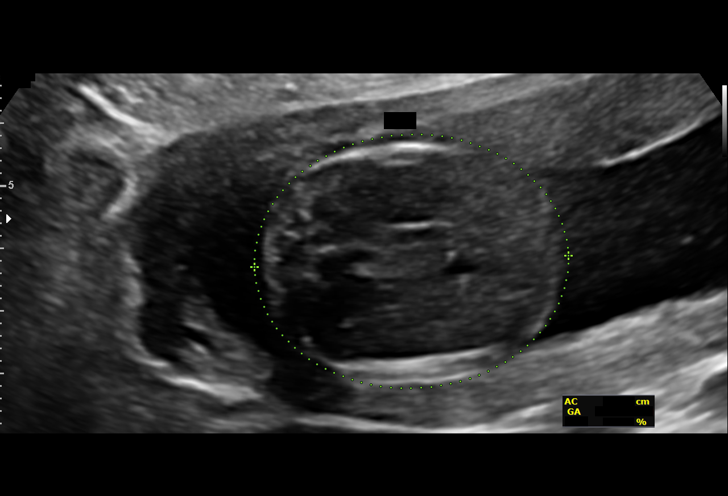
[im 44/85]
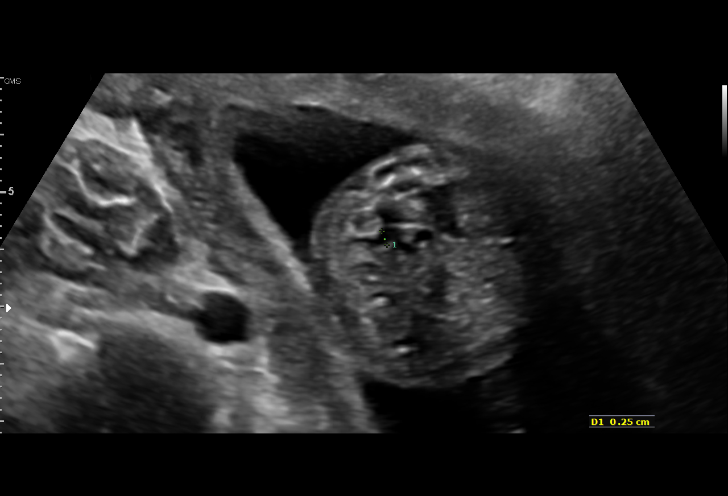
[im 50/85]
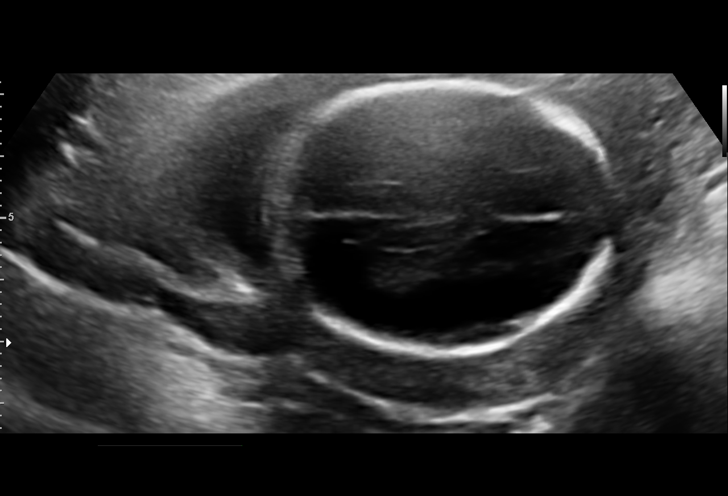
[im 57/85]
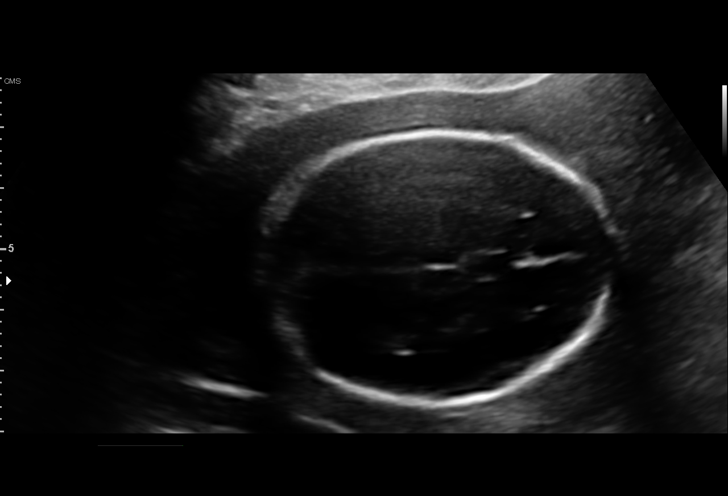
[im 63/85]
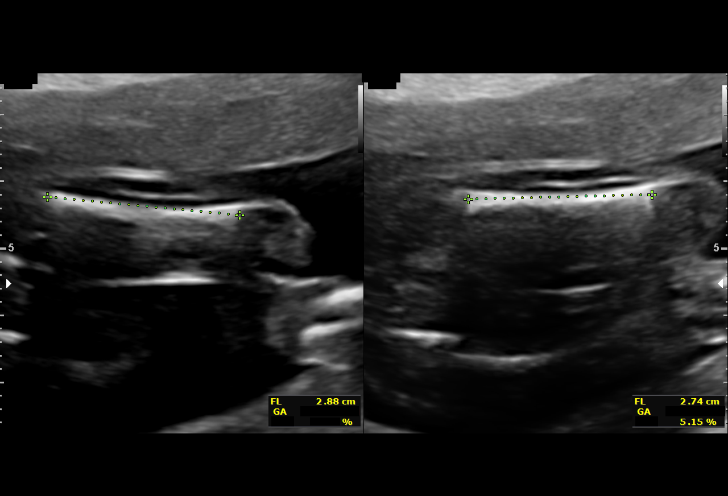
[im 69/85]
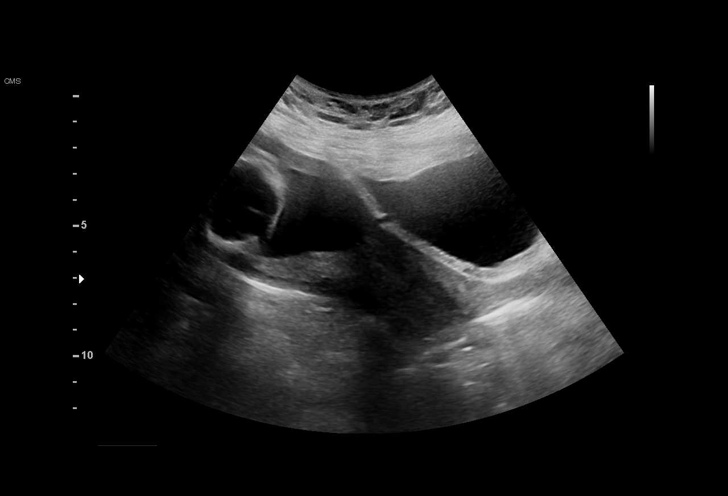
[im 75/85]
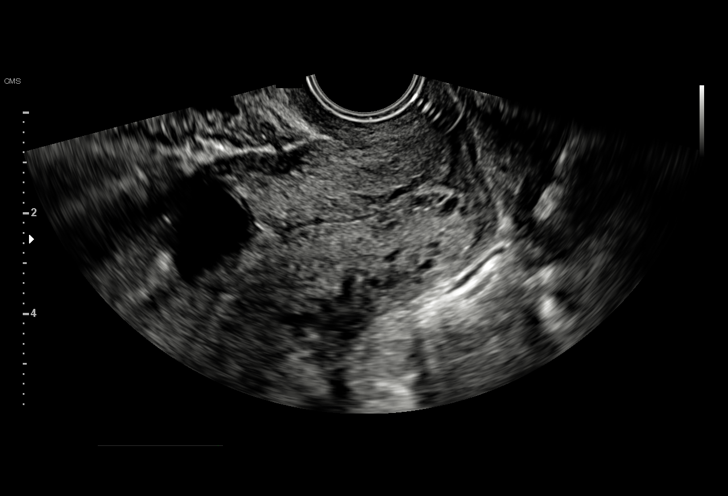
[im 81/85]
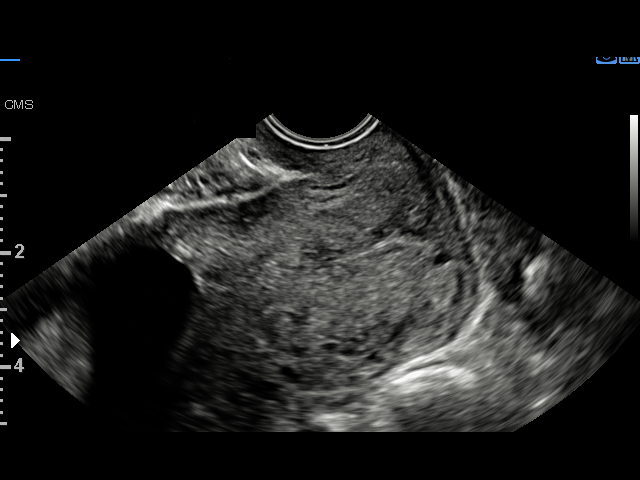

[13 of 28 positions shown; findings below may reference images not displayed]

[facility]

                                                            OB/Gyn Clinic

 ----------------------------------------------------------------------

 ----------------------------------------------------------------------
Indications

  Antenatal follow-up for nonvisualized fetal
  anatomy
  Advanced maternal age multigravida 35+,
  second trimester (LOW risk NIPS)
  Poor obstetric history: Previous preterm
  delivery, antepartum (23 weeks PPROM and
  3cm dilated)  Currently on 17P
  Previous cesarean delivery X 2, antepartum
  Short interval between pregancies, 2nd
  trimester (Delivered November 2016)
  Poor obstetric history: Previous midtrimester
  loss (Cervical insufficiency)
  Obesity complicating pregnancy, second
  trimester (BMI 32.3)
  19 weeks gestation of pregnancy
 ----------------------------------------------------------------------
Fetal Evaluation

 Num Of Fetuses:         1
 Fetal Heart Rate(bpm):  138
 Cardiac Activity:       Observed
 Presentation:           Cephalic
 Placenta:               Anterior
 P. Cord Insertion:      Visualized
 Amniotic Fluid
 AFI FV:      Within normal limits

                             Largest Pocket(cm)

Biometry

 BPD:      44.1  mm     G. Age:  19w 2d         34  %    CI:        69.54   %    70 - 86
                                                         FL/HC:      16.8   %    16.8 -
 HC:      168.8  mm     G. Age:  19w 4d         34  %    HC/AC:      1.16        1.09 -
 AC:      146.1  mm     G. Age:  19w 6d         51  %    FL/BPD:     64.4   %
 FL:       28.4  mm     G. Age:  18w 5d         13  %    FL/AC:      19.4   %    20 - 24
 HUM:      29.6  mm     G. Age:  19w 5d         52  %
 CER:      18.9  mm     G. Age:  18w 3d         14  %
 NFT:       5.1  mm
 CM:        3.5  mm

 Est. FW:     289  gm    0 lb 10 oz      41  %
OB History

 Gravidity:    4         Term:   2        Prem:   1        SAB:   0
 TOP:          0       Ectopic:  0        Living: 2
Gestational Age

 LMP:           22w 0d        Date:  11/01/17                 EDD:   08/08/18
 U/S Today:     19w 3d                                        EDD:   08/26/18
 Best:          19w 5d     Det. By:  U/S  (03/07/18)          EDD:   08/24/18
Anatomy

 Cranium:               Appears normal         LVOT:                   Appears normal
 Cavum:                 Appears normal         Aortic Arch:            Appears normal
 Ventricles:            Appears normal         Ductal Arch:            Appears normal
 Choroid Plexus:        Previously seen        Diaphragm:              Previously seen
 Cerebellum:            Appears normal         Stomach:                Appears normal, left
                                                                       sided
 Posterior Fossa:       Appears normal         Abdomen:                Appears normal
 Nuchal Fold:           Appears normal         Abdominal Wall:         Appears nml (cord
                                                                       insert, abd wall)
 Face:                  Appears normal         Cord Vessels:           Appears normal (3
                        (orbits and profile)                           vessel cord)
 Lips:                  Appears normal         Kidneys:                Appear normal
 Palate:                Not well visualized    Bladder:                Appears normal
 Thoracic:              Appears normal         Spine:                  Appears normal
 Heart:                 Appears normal         Upper Extremities:      Previously seen
                        (4CH, axis, and
                        situs)
 RVOT:                  Previously seen        Lower Extremities:      Previously seen

 Other:  Nasal bone visualized. Technically difficult due to maternal habitus
         and fetal position.
Cervix Uterus Adnexa

 Cervix
 Length:            3.7  cm.
 Normal appearance by transvaginal scan
 Uterus
 No abnormality visualized.

 Left Ovary
 Within normal limits.

 Right Ovary
 Within normal limits.

 Cul De Sac
 No free fluid seen.

 Adnexa
 No abnormality visualized.
Comments

 U/S images reviewed. Findings reviewed with patient.
 Appropriate fetal growth is noted.   No fetal abnormalities are
 seen.  Cervix appears closed, intact and measures 3.7 cm.
 Questions answered.
 10 minutes spent face to face with patient.
 Recommendations: 1) Serial TV U/S every 2 weeks through
 24 weeks
Recommendations

  1) Serial TV U/S every 2 weeks through 24 weeks

              Sajanas, Pastoliu

## 2019-11-04 IMAGING — US US MFM OB TRANSVAGINAL
2 series · 12 of 28 positions shown · non-contrast
Comparison: none

[Series 1: us mfm ob transvaginal · 5 of 21 slices shown (1 of 2)]
[im 2/21]
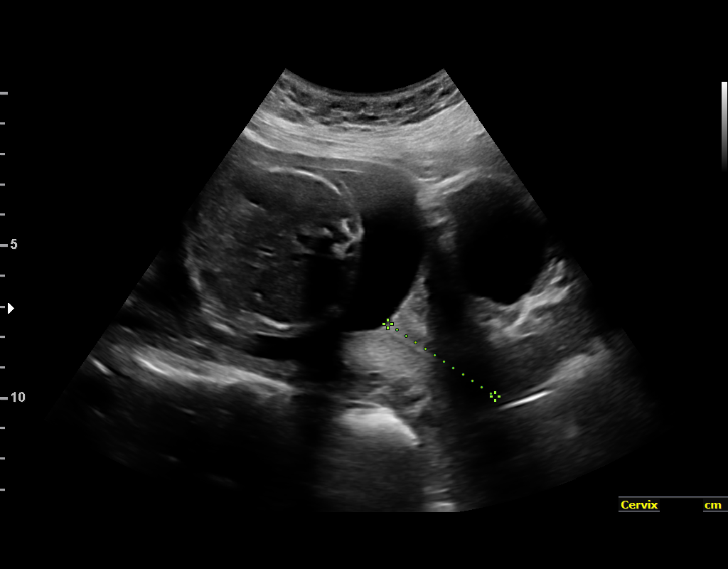
[im 6/21]
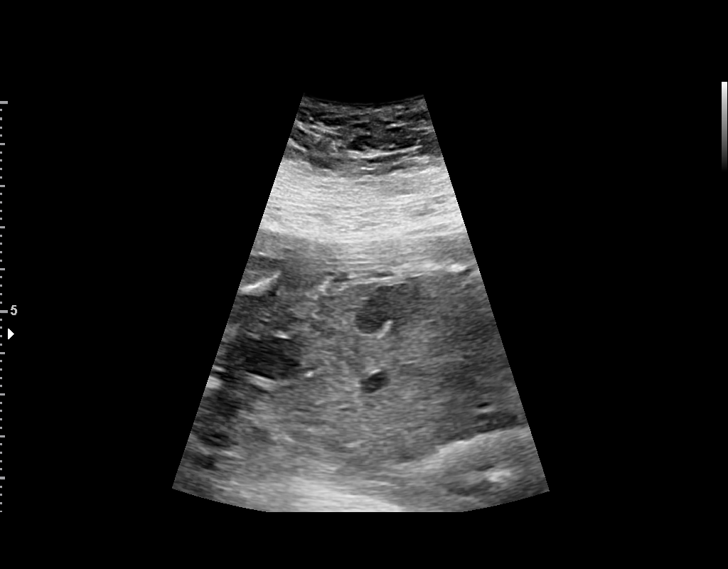
[im 10/21]
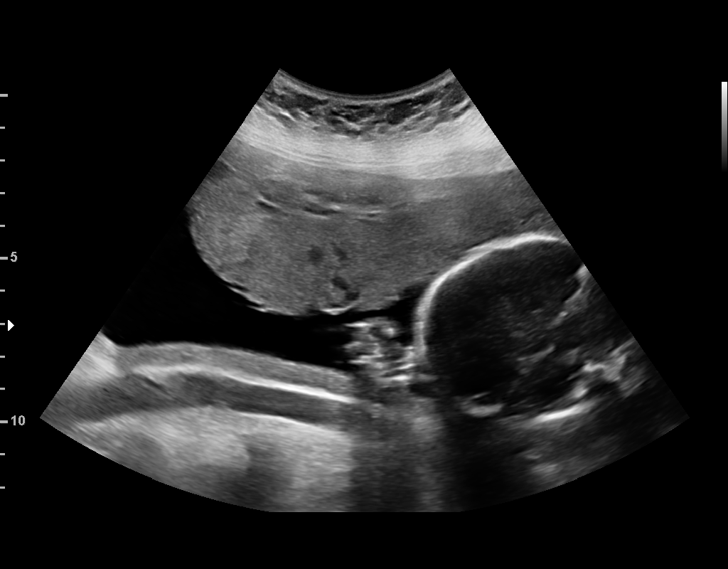
[im 15/21]
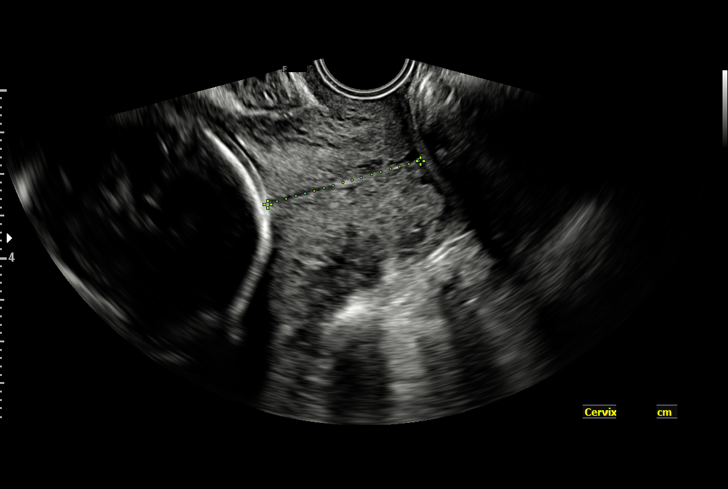
[im 19/21]
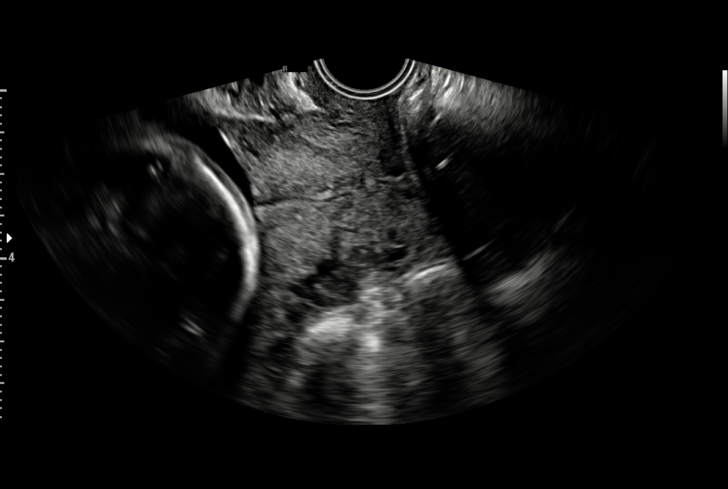

[Series 3: us mfm ob transvaginal · 27 acquisitions, 7 frames shown (2 of 2)]
[im 1/27]
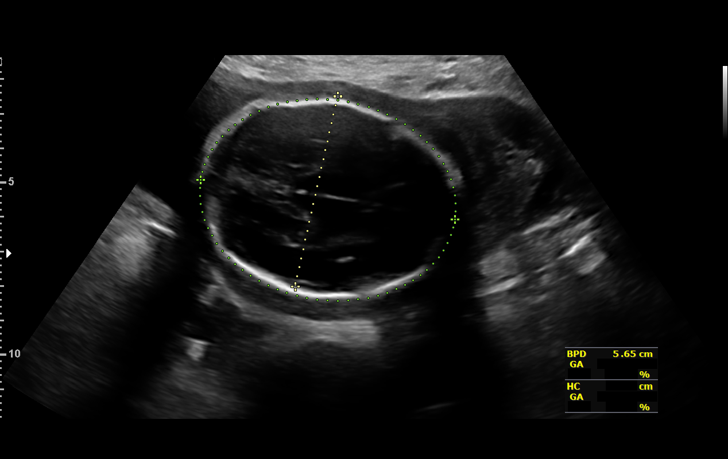
[im 6/27]
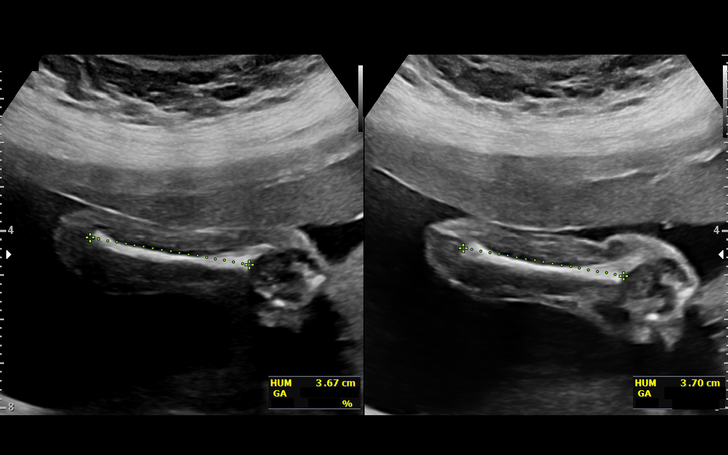
[im 9/27]
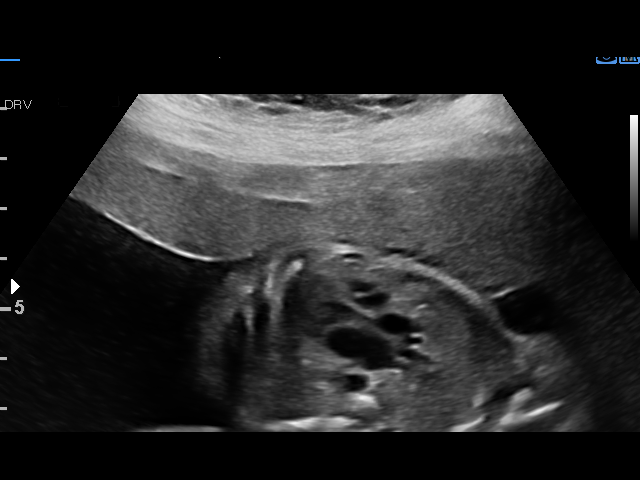
[im 13/27]
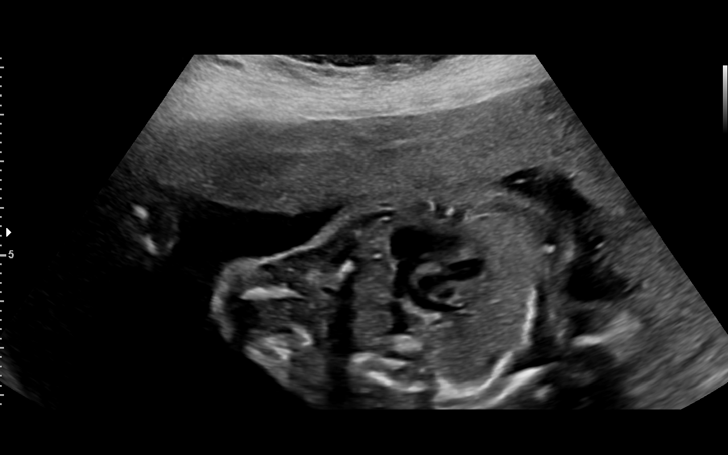
[im 18/27]
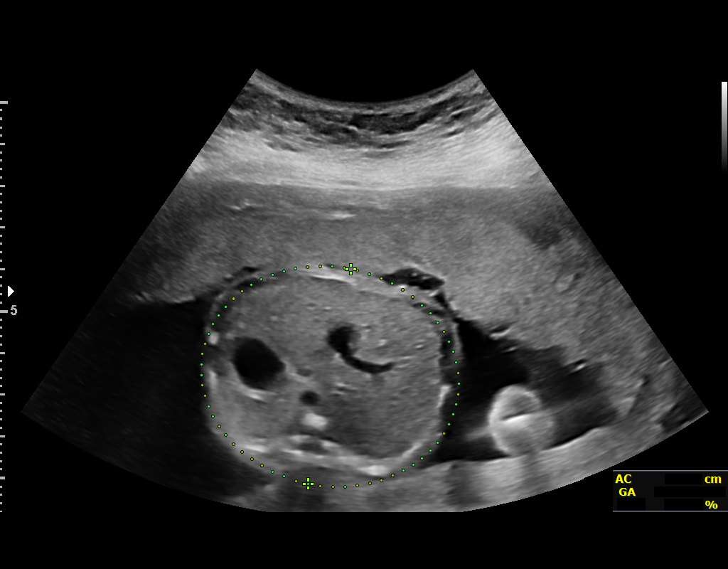
[im 21/27]
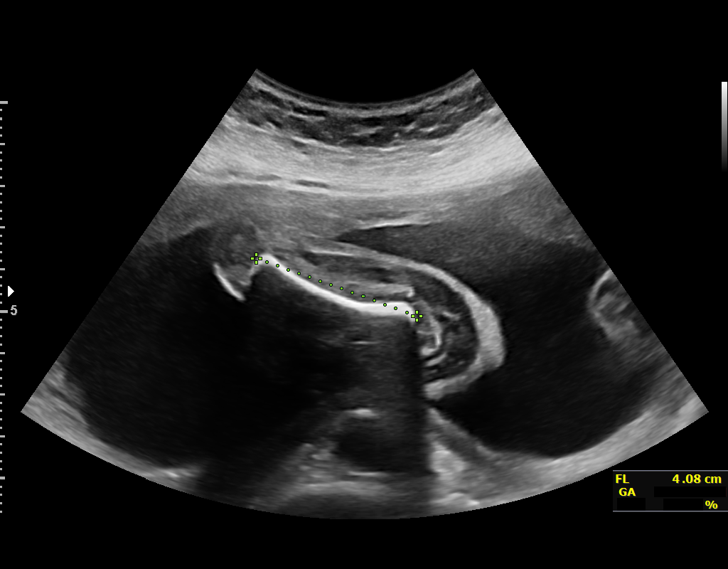
[im 25/27]
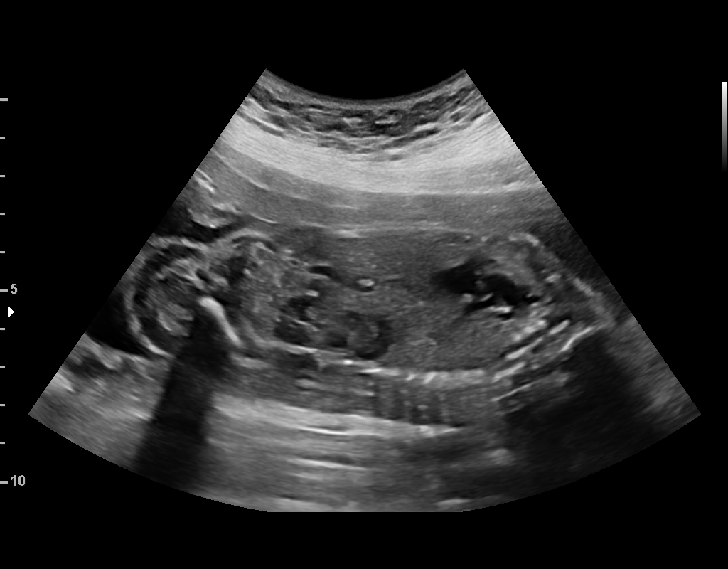

[12 of 28 positions shown; findings below may reference images not displayed]

[facility]

                                                            OB/Gyn Clinic

 ----------------------------------------------------------------------

 ----------------------------------------------------------------------
Indications

  Poor obstetric history: Previous midtrimester
  loss (Cervical insufficiency)
  23 weeks gestation of pregnancy
  Advanced maternal age multigravida 35+,
  second trimester (LOW risk NIPS)
  Poor obstetric history: Previous preterm
  delivery, antepartum (23 weeks PPROM and
  3cm dilated)  Currently on 17P
  Previous cesarean delivery X 2, antepartum
  Short interval between pregancies, 2nd
  trimester (Delivered November 2016)
  Obesity complicating pregnancy, second
  trimester (BMI 32.3)
  Encounter for other antenatal screening
  follow-up
 ----------------------------------------------------------------------
Vital Signs

 BMI:         34.11        Pulse:  66
 BP:          113/66
Fetal Evaluation
 Num Of Fetuses:         1
 Fetal Heart Rate(bpm):  131
 Cardiac Activity:       Observed
 Presentation:           Transverse, head to maternal left
 Placenta:               Anterior
 P. Cord Insertion:      Previously Visualized

 Amniotic Fluid
 AFI FV:      Within normal limits

                             Largest Pocket(cm)

Biometry

 BPD:      55.7  mm     G. Age:  23w 0d         19  %    CI:        70.72   %    70 - 86
                                                         FL/HC:      19.3   %    18.7 -
 HC:      211.1  mm     G. Age:  23w 1d         17  %    HC/AC:      1.16        1.05 -
 AC:      181.7  mm     G. Age:  23w 0d         21  %    FL/BPD:     73.2   %    71 - 87
 FL:       40.8  mm     G. Age:  23w 2d         23  %    FL/AC:      22.5   %    20 - 24
 HUM:        37  mm     G. Age:  22w 6d         25  %
 LV:        4.3  mm

 Est. FW:     563  gm      1 lb 4 oz     38  %
OB History

 Gravidity:    4         Term:   2        Prem:   1        SAB:   0
 TOP:          0       Ectopic:  0        Living: 2
Gestational Age

 LMP:           26w 0d        Date:  11/01/17                 EDD:   08/08/18
 U/S Today:     23w 1d                                        EDD:   08/28/18
 Best:          23w 5d     Det. By:  U/S  (03/07/18)          EDD:   08/24/18
Anatomy

 Cranium:               Appears normal         LVOT:                   Appears normal
 Cavum:                 Previously seen        Aortic Arch:            Previously seen
 Ventricles:            Appears normal         Ductal Arch:            Previously seen
 Choroid Plexus:        Previously seen        Diaphragm:              Previously seen
 Cerebellum:            Previously seen        Stomach:                Appears normal, left
                                                                       sided
 Posterior Fossa:       Previously seen        Abdomen:                Appears normal
 Nuchal Fold:           Previously seen        Abdominal Wall:         Previously seen
 Face:                  Orbits and profile     Cord Vessels:           Previously seen
                        previously seen
 Lips:                  Previously seen        Kidneys:                Appear normal
 Palate:                Not well visualized    Bladder:                Appears normal
 Thoracic:              Appears normal         Spine:                  Previously seen
 Heart:                 Appears normal         Upper Extremities:      Previously seen
                        (4CH, axis, and
                        situs)
 RVOT:                  Appears normal         Lower Extremities:      Previously seen

 Other:  Nasal bone prev visualized. Technically difficult due to maternal
         habitus and fetal position.
Cervix Uterus Adnexa

 Cervix
 Length:           3.92  cm.
 Measured transvaginally.  Appears closed, without funnelling.
Comments

 Comments:
 U/S images reviewed. Findings reviewed with patient.
 Appropriate fetal growth is noted.   No fetal abnormalities are
 seen.   TV cervical length is 3.5 cm.  Questions answered.
 10 minutes spent face to face with patient.  Translator utilized.
 Recommendations: 1) Serial U/S every 4 weeks for fetal
 growth
Recommendations

  1) Serial U/S every 4 weeks for fetal growth

              Sanimba, Claudio Pedro

## 2019-12-03 IMAGING — US US MFM OB FOLLOW-UP
1 series · 13 of 28 positions shown · non-contrast
Comparison: none

[Series 1: us mfm ob follow-up · 34 acquisitions, 13 frames shown]
[im 2/34]
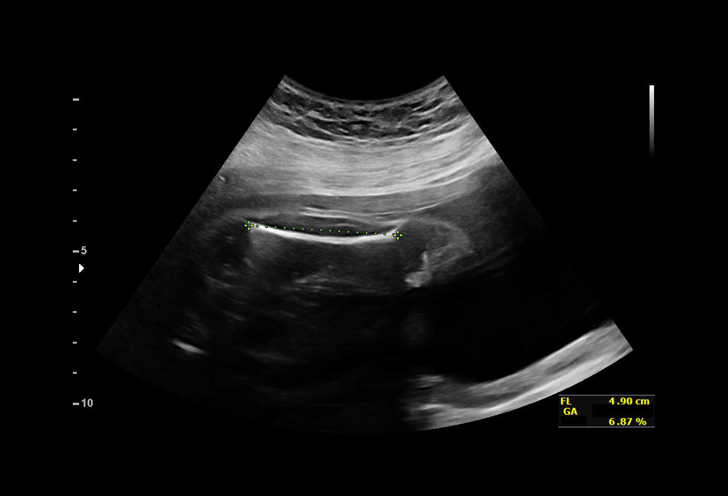
[im 4/34]
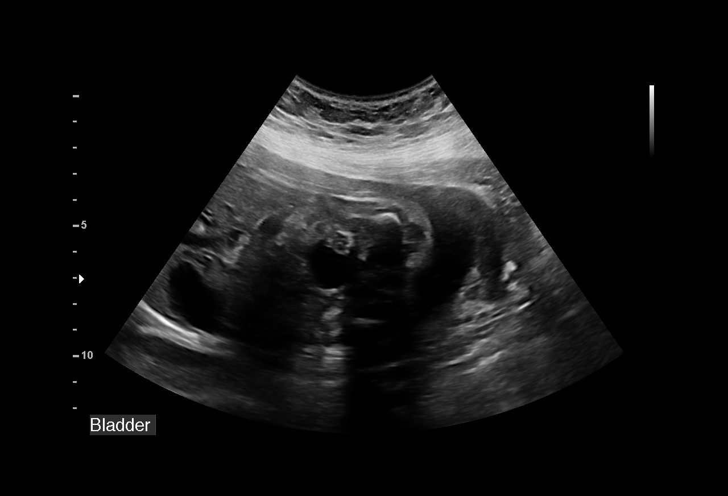
[im 7/34]
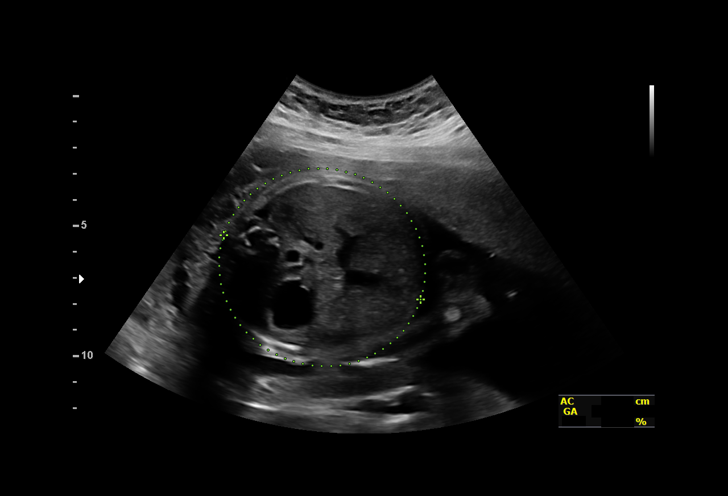
[im 9/34]
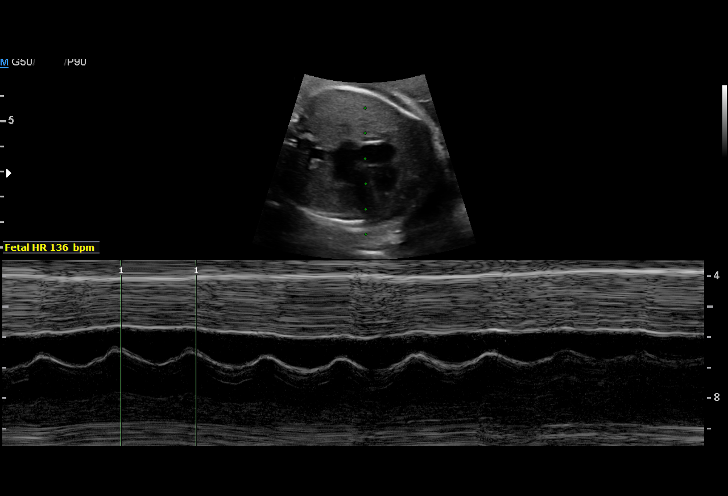
[im 12/34]
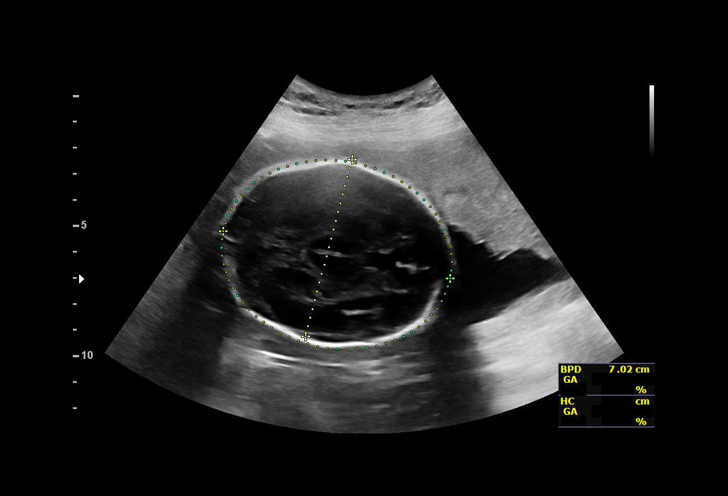
[im 14/34]
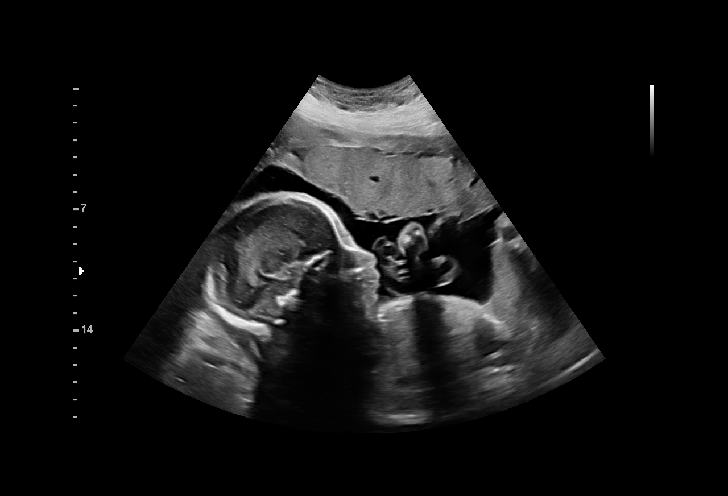
[im 18/34]
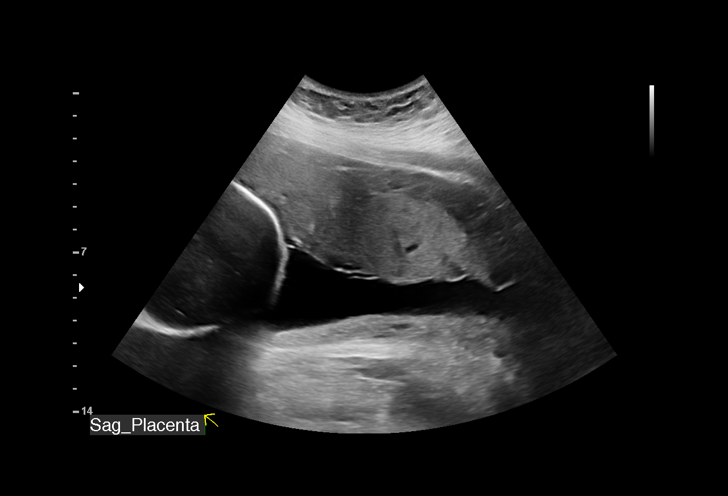
[im 20/34]
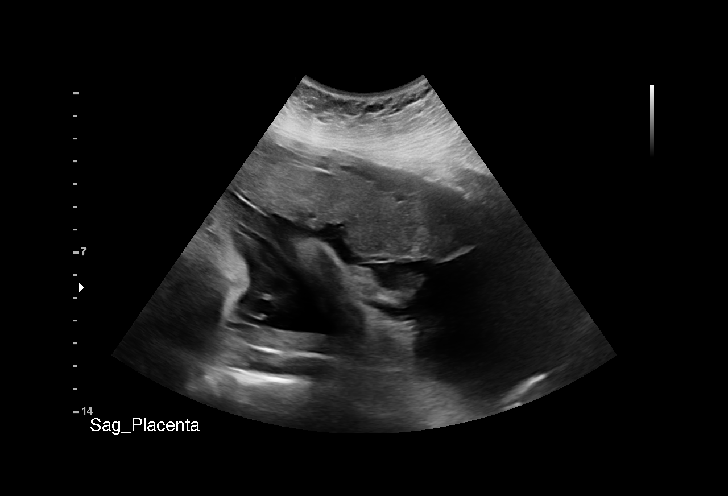
[im 23/34]
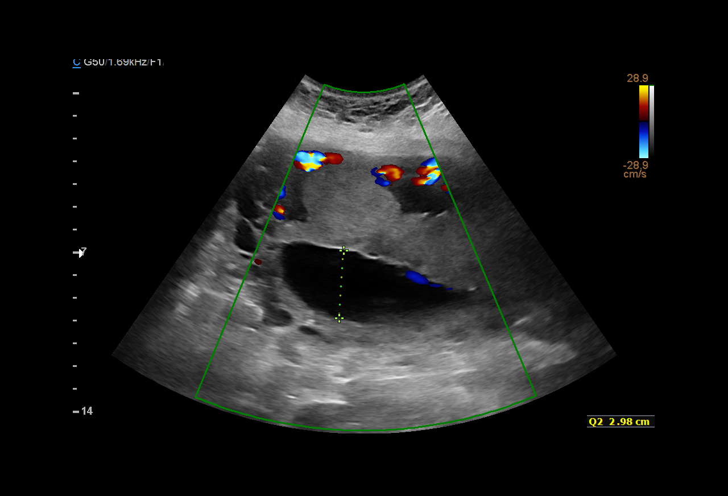
[im 25/34]
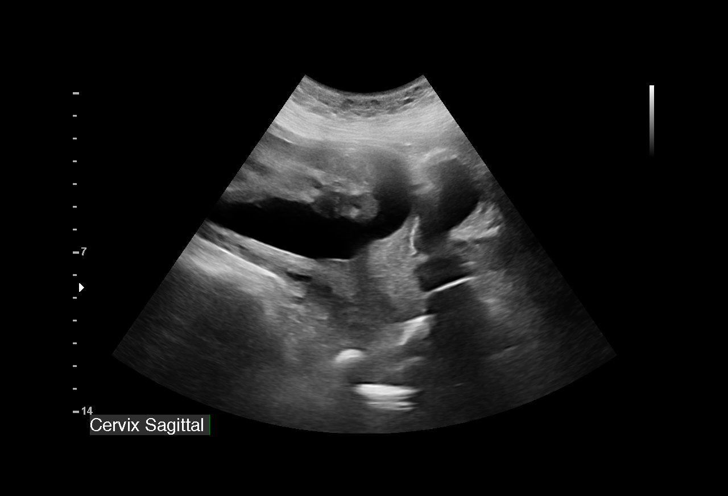
[im 27/34]
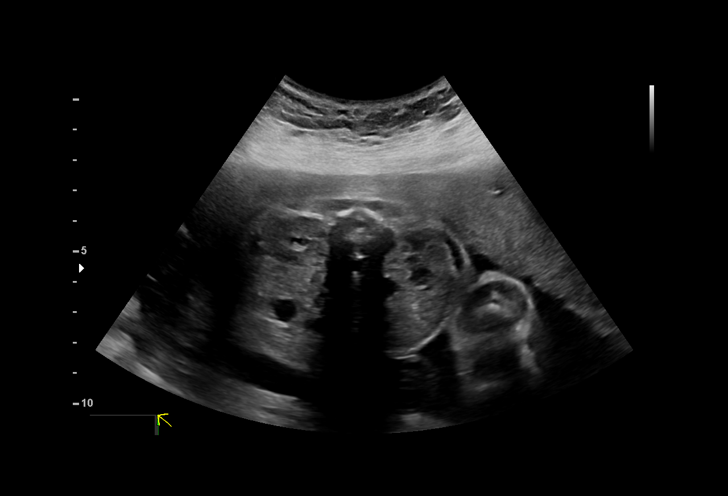
[im 30/34]
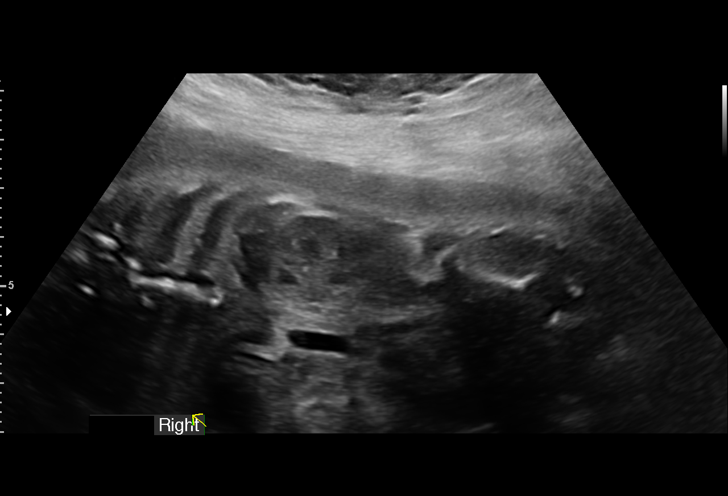
[im 32/34]
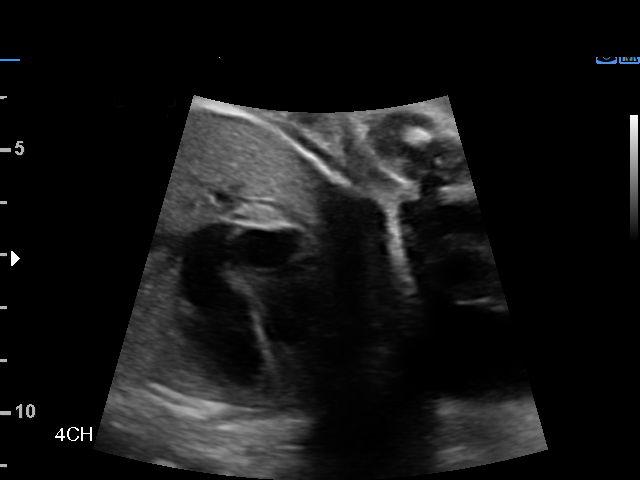

[13 of 28 positions shown; findings below may reference images not displayed]

[facility]

                                                             [facility]yn Clinic

 ----------------------------------------------------------------------

 ----------------------------------------------------------------------
Indications

  Encounter for other antenatal screening
  follow-up
  27 weeks gestation of pregnancy
  Poor obstetric history: Previous midtrimester
  loss (Cervical insufficiency)
  Advanced maternal age multigravida 35+,
  second trimester (LOW risk NIPS)
  Poor obstetric history: Previous preterm
  delivery, antepartum (23 weeks PPROM and
  3cm dilated)  Currently on 17P
  Previous cesarean delivery X 2, antepartum
  Short interval between pregancies, 2nd
  trimester (Delivered November 2016)
  Obesity complicating pregnancy, second
  trimester (BMI 32.3)
 ----------------------------------------------------------------------
Vital Signs

 (lb):
 BMI:         35.44        Pulse:  80
 BP:          114/63
Fetal Evaluation
 Num Of Fetuses:          1
 Fetal Heart              136
 Rate(bpm):
 Cardiac Activity:        Observed
 Presentation:            Breech
 Placenta:                Anterior
 P. Cord Insertion:       Previously Visualized

 Amniotic Fluid
 AFI FV:      Within normal limits

 AFI Sum(cm)     %Tile       Largest Pocket(cm)
 14.3            47

 RUQ(cm)       RLQ(cm)       LUQ(cm)        LLQ(cm)

Biometry

 BPD:        70  mm     G. Age:  28w 1d         47  %    CI:         75.48  %    70 - 86
                                                         FL/HC:       19.3  %    18.8 -
 HC:      255.5  mm     G. Age:  27w 5d         19  %    HC/AC:       1.05       1.05 -
 AC:      242.6  mm     G. Age:  28w 4d         63  %    FL/BPD:      70.4  %    71 - 87
 FL:       49.3  mm     G. Age:  26w 4d          9  %    FL/AC:       20.3  %    20 - 24

 LV:        2.7  mm

 Est. FW:    3337   g      2 lb 8 oz     50  %
                    m
OB History

 Gravidity:    4         Term:   2        Prem:   1         SAB:   0
 TOP:          0       Ectopic:  0        Living: 2
Gestational Age

 LMP:           30w 1d        Date:  11/01/17                 EDD:    08/08/18
 U/S Today:     27w 5d                                        EDD:    08/25/18
 Best:          27w 6d     Det. By:  U/S  (03/07/18)          EDD:    08/24/18
Anatomy

 Cranium:               Appears normal         LVOT:                   Previously seen
 Cavum:                 Previously seen        Aortic Arch:            Previously seen
 Ventricles:            Appears normal         Ductal Arch:            Previously seen
 Choroid Plexus:        Previously seen        Diaphragm:              Previously seen
 Cerebellum:            Previously seen        Stomach:                Appears normal,
                                                                       left sided
 Posterior Fossa:       Previously seen        Abdomen:                Appears normal
 Nuchal Fold:           Previously seen        Abdominal Wall:         Previously seen
 Face:                  Orbits and profile     Cord Vessels:           Previously seen
                        previously seen
 Lips:                  Previously seen        Kidneys:                Appear normal
 Palate:                Not well visualized    Bladder:                Appears normal
 Thoracic:              Appears normal         Spine:                  Previously seen
 Heart:                 Appears normal         Upper Extremities:      Previously seen
                        (4CH, axis, and
                        situs)
 RVOT:                  Previously seen        Lower Extremities:      Previously seen
 Other:  Nasal bone prev visualized. Technically difficult due to maternal
         habitus and fetal position.
Cervix Uterus Adnexa

 Cervix
 Length:           3.31  cm.
 Normal appearance by transabdominal scan.
Impression

 Normal interval growth.
Recommendations

 Follow up growth in 8 weeks given BMI and AMA.

## 2020-01-28 IMAGING — US US MFM OB FOLLOW-UP
1 series · 14 of 28 positions shown · non-contrast
Comparison: none

[Series 1: us mfm ob follow-up · 38 acquisitions, 14 frames shown]
[im 2/38]
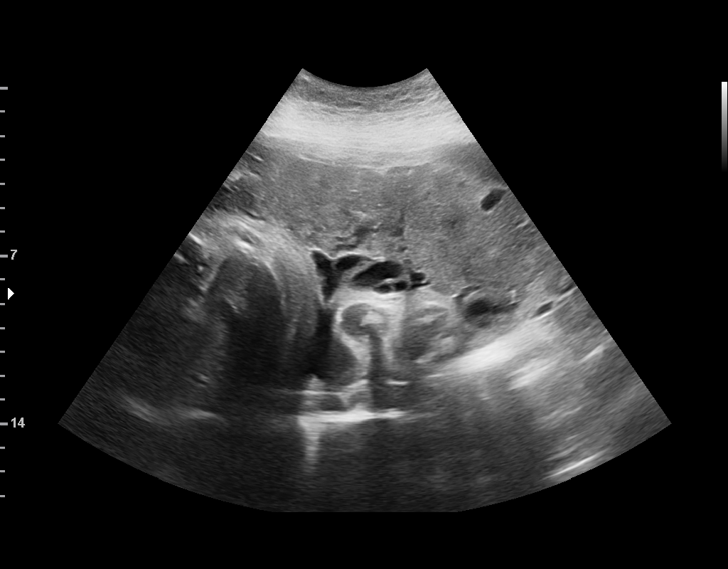
[im 5/38]
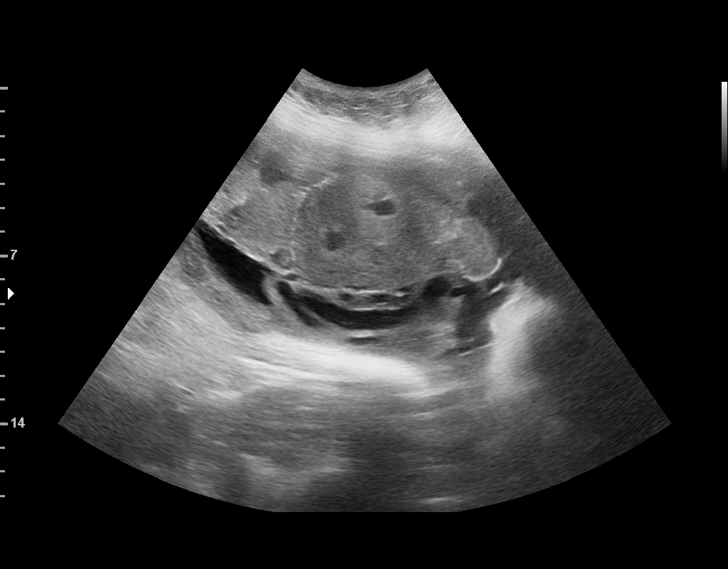
[im 7/38]
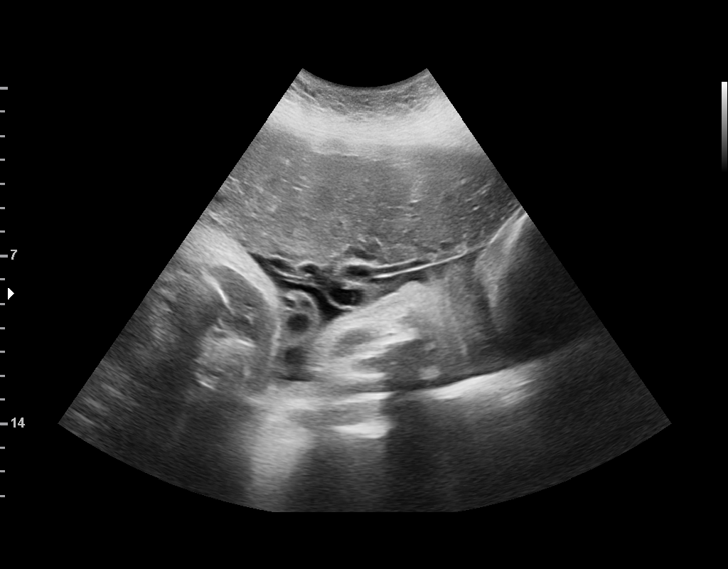
[im 10/38]
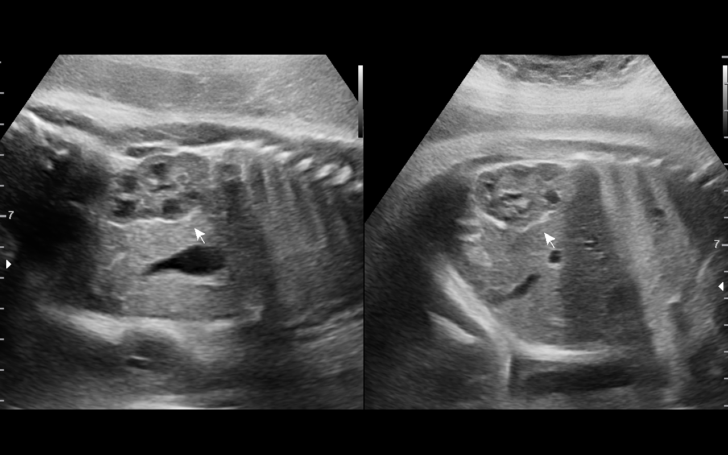
[im 13/38]
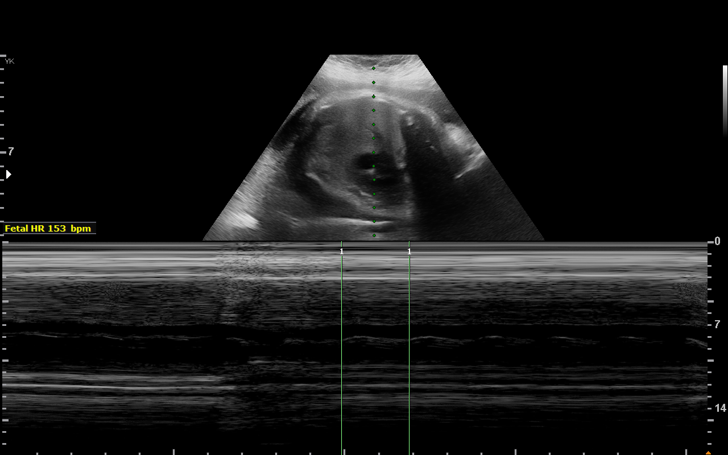
[im 16/38]
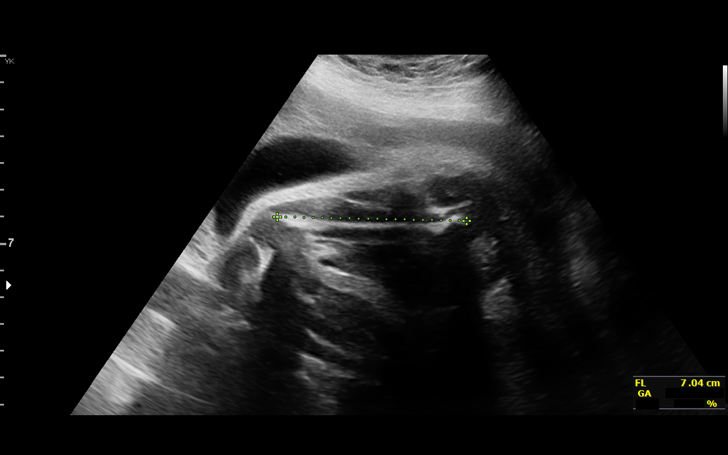
[im 18/38]
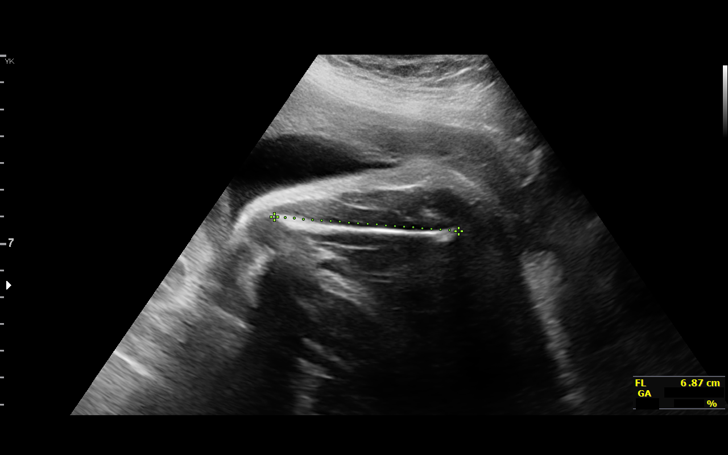
[im 21/38]
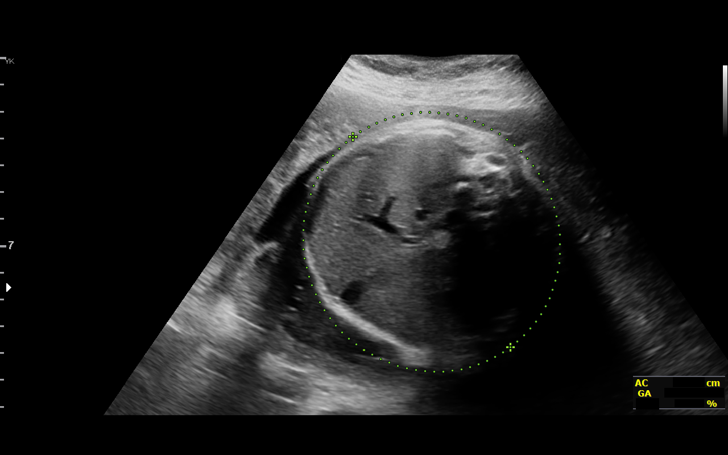
[im 24/38]
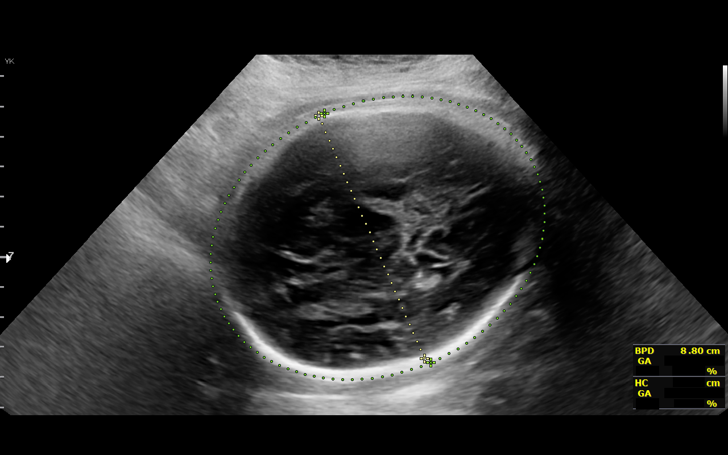
[im 27/38]
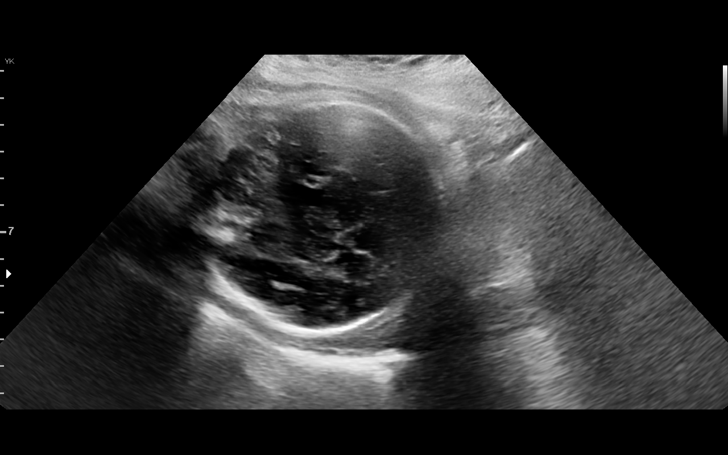
[im 29/38]
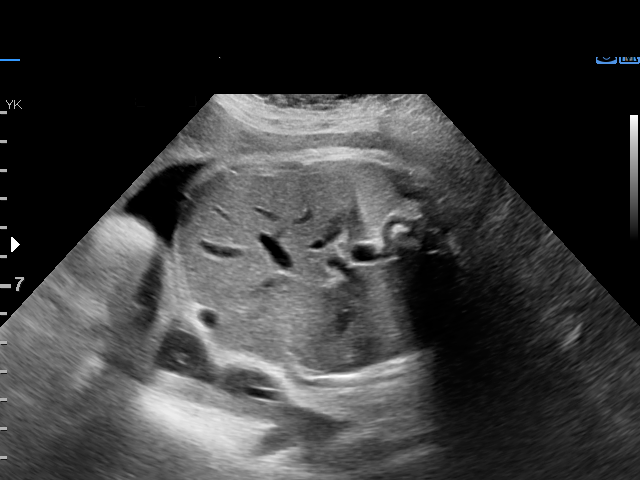
[im 32/38]
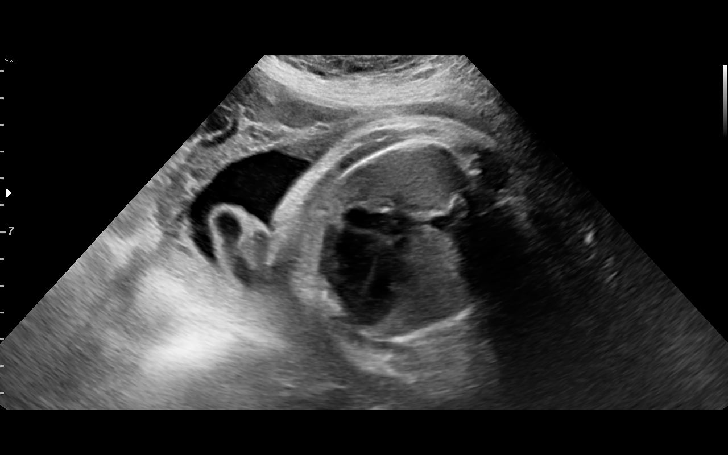
[im 35/38]
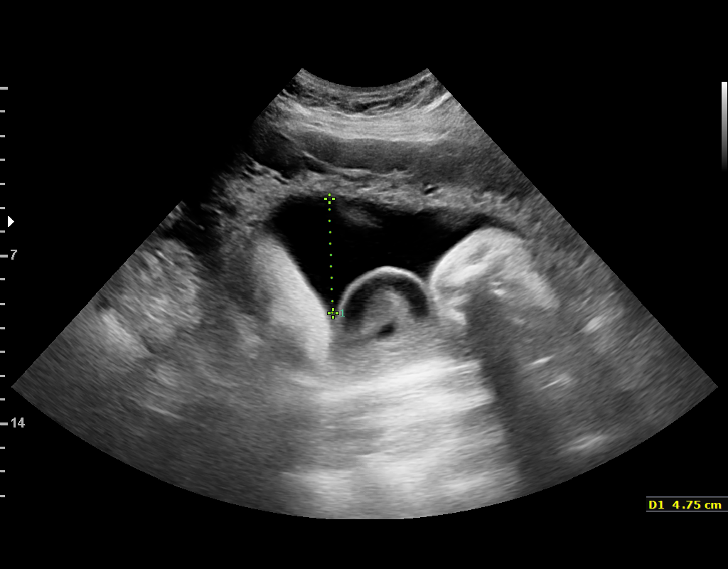
[im 38/38]
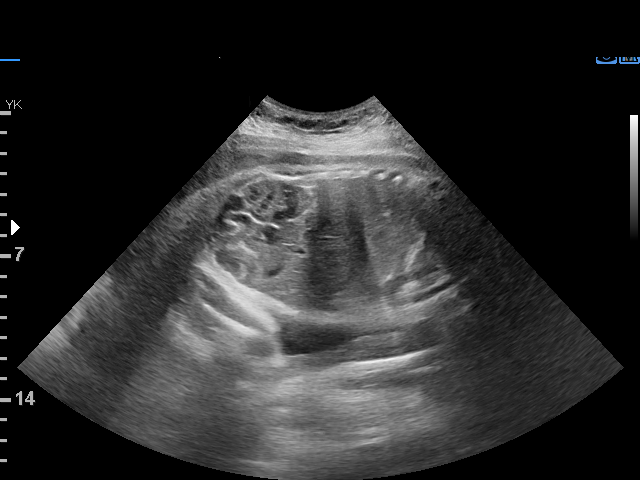

[14 of 28 positions shown; findings below may reference images not displayed]

[facility]

                                                            [facility]/Gyn Clinic

                                                       [facility]
 ----------------------------------------------------------------------

 ----------------------------------------------------------------------
Indications

  Poor obstetric history: Previous midtrimester
  loss (Cervical insufficiency)
  Advanced maternal age multigravida 35+,
  second trimester (LOW risk NIPS)
  Poor obstetric history: Previous preterm
  delivery, antepartum (23 weeks PPROM and
  3cm dilated)  Currently on 17P
  Previous cesarean delivery X 2, antepartum
  Short interval between pregancies, 2nd
  trimester (Delivered November 2016)
  Obesity complicating pregnancy, second
  trimester (BMI 32.3)
  35 weeks gestation of pregnancy
 ----------------------------------------------------------------------
Vital Signs

                                                Height:        5'5"
Fetal Evaluation

 Num Of Fetuses:         1
 Fetal Heart Rate(bpm):  141
 Cardiac Activity:       Observed
 Presentation:           Cephalic
 Placenta:               Anterior
 P. Cord Insertion:      Visualized, central
 Amniotic Fluid
 AFI FV:      Within normal limits

 AFI Sum(cm)     %Tile       Largest Pocket(cm)
 12.88           43

 RUQ(cm)       RLQ(cm)       LUQ(cm)        LLQ(cm)

Biometry

 BPD:      86.7  mm     G. Age:  35w 0d         32  %    CI:        72.49   %    70 - 86
                                                         FL/HC:      21.3   %    20.1 -
 HC:      323.9  mm     G. Age:  36w 5d         38  %    HC/AC:      1.05        0.93 -
 AC:      308.4  mm     G. Age:  34w 5d         29  %    FL/BPD:     79.6   %    71 - 87
 FL:         69  mm     G. Age:  35w 3d         34  %    FL/AC:      22.4   %    20 - 24

 Est. FW:    0599  gm    5 lb 12 oz      49  %
OB History

 Gravidity:    4         Term:   2        Prem:   1        SAB:   0
 TOP:          0       Ectopic:  0        Living: 2
Gestational Age

 LMP:           38w 1d        Date:  11/01/17                 EDD:   08/08/18
 U/S Today:     35w 3d                                        EDD:   08/27/18
 Best:          35w 6d     Det. By:  U/S  (03/07/18)          EDD:   08/24/18
Anatomy

 Ventricles:            Appears normal         Kidneys:                Appear normal
 Heart:                 Appears normal         Bladder:                Appears normal
                        (4CH, axis, and
                        situs)
 Stomach:               Appears normal, left
                        sided
Cervix Uterus Adnexa

 Cervix
 Not adaquately visualized
Impression

 Amniotic fluid is normal and good fetal activity is seen. Fetal
 growth is appropriate for gestational age. Patient does not
 have gestational diabetes.
Recommendations

 Follow-up as clinically indicated.
                 Tiger, Zeinab

## 2021-10-18 ENCOUNTER — Encounter: Payer: Self-pay | Admitting: *Deleted

## 2021-10-25 NOTE — Congregational Nurse Program (Signed)
  Dept: [phone]   Congregational Nurse Program Note  Date of Encounter: 10/18/2021  Past Medical History: Past Medical History:  Diagnosis Date   Preterm labor     Encounter Details:  CNP Questionnaire - 10/18/21 1700      Questionnaire   Do you give verbal consent to treat you today? Yes    Location Patient Served  [facility]    Visit Setting Church or Organization    Patient Status Immigrant    Insurance Uninsured ([employer]/Self-Pay)    Insurance Referral N/A    Medication N/A    Medical Provider Yes    Screening Referrals N/A    Medical Referral N/A    Medical Appointment Made N/A    Food Have Food Insecurities    Transportation N/A    Housing/Utilities N/A    Interpersonal Safety N/A    Intervention Blood pressure;Blood glucose    ED Visit Averted Yes    Life-Saving Intervention Made N/A            Client came into [facility] nurse [facility].  BP 143/90 HR 72 and fingerstick glucose 88.  Client says that hypertension runs in her family.  She was worried about her blood sugar, but it is WNL.  Client pleased with glucose results.  Client speaks Spanish and interpreter, Albar, was used by telephone.  Educated client on hypertension, healthy eating, and normal blood sugar results.  Client to follow up with this CN as desired.  Roderic Palau, RN, MSN, CNP [phone] Office [phone] Cell

## 2022-05-29 DIAGNOSIS — R7303 Prediabetes: Secondary | ICD-10-CM

## 2022-05-29 HISTORY — DX: Prediabetes: R73.03

## 2022-12-05 ENCOUNTER — Other Ambulatory Visit: Payer: Self-pay

## 2022-12-05 ENCOUNTER — Inpatient Hospital Stay (HOSPITAL_COMMUNITY): Payer: Medicaid Other | Admitting: Certified Registered Nurse Anesthetist

## 2022-12-05 ENCOUNTER — Encounter (HOSPITAL_COMMUNITY): Payer: Self-pay | Admitting: Emergency Medicine

## 2022-12-05 ENCOUNTER — Inpatient Hospital Stay (HOSPITAL_COMMUNITY)
Admission: EM | Admit: 2022-12-05 | Discharge: 2022-12-05 | Disposition: A | Discharge status: Home / Self Care | Payer: Medicaid Other | Attending: Obstetrics & Gynecology | Admitting: Obstetrics & Gynecology

## 2022-12-05 ENCOUNTER — Inpatient Hospital Stay (EMERGENCY_DEPARTMENT_HOSPITAL): Payer: Medicaid Other | Admitting: Certified Registered Nurse Anesthetist

## 2022-12-05 ENCOUNTER — Inpatient Hospital Stay (HOSPITAL_COMMUNITY): Payer: Medicaid Other

## 2022-12-05 ENCOUNTER — Encounter (HOSPITAL_COMMUNITY): Admission: EM | Disposition: A | Discharge status: Home / Self Care | Payer: Self-pay | Source: Home / Self Care

## 2022-12-05 DIAGNOSIS — O009 Unspecified ectopic pregnancy without intrauterine pregnancy: Secondary | ICD-10-CM | POA: Diagnosis not present

## 2022-12-05 DIAGNOSIS — Z3A01 Less than 8 weeks gestation of pregnancy: Secondary | ICD-10-CM | POA: Diagnosis not present

## 2022-12-05 DIAGNOSIS — Z87891 Personal history of nicotine dependence: Secondary | ICD-10-CM | POA: Insufficient documentation

## 2022-12-05 DIAGNOSIS — Z98891 History of uterine scar from previous surgery: Secondary | ICD-10-CM | POA: Diagnosis not present

## 2022-12-05 DIAGNOSIS — O209 Hemorrhage in early pregnancy, unspecified: Secondary | ICD-10-CM

## 2022-12-05 DIAGNOSIS — O00101 Right tubal pregnancy without intrauterine pregnancy: Secondary | ICD-10-CM | POA: Insufficient documentation

## 2022-12-05 DIAGNOSIS — Z3A00 Weeks of gestation of pregnancy not specified: Secondary | ICD-10-CM | POA: Diagnosis not present

## 2022-12-05 DIAGNOSIS — Z6730 Type AB blood, Rh positive: Secondary | ICD-10-CM

## 2022-12-05 HISTORY — PX: DIAGNOSTIC LAPAROSCOPY WITH REMOVAL OF ECTOPIC PREGNANCY: SHX6449

## 2022-12-05 LAB — URINALYSIS, ROUTINE W REFLEX MICROSCOPIC
Bacteria, UA: NONE SEEN
Bilirubin Urine: NEGATIVE
Glucose, UA: NEGATIVE mg/dL
Ketones, ur: NEGATIVE mg/dL
Leukocytes,Ua: NEGATIVE
Nitrite: NEGATIVE
Protein, ur: 100 mg/dL — AB
RBC / HPF: >50 RBC/hpf (ref 0–5)
Specific Gravity, Urine: 1.009 (ref 1.005–1.030)
pH: 7 (ref 5.0–8.0)

## 2022-12-05 LAB — CBC
HCT: 29.8 % — ABNORMAL LOW (ref 36.0–46.0)
Hemoglobin: 10.2 g/dL — ABNORMAL LOW (ref 12.0–15.0)
MCH: 29.4 pg (ref 26.0–34.0)
MCHC: 34.2 g/dL (ref 30.0–36.0)
MCV: 85.9 fL (ref 80.0–100.0)
Platelets: 260 10*3/uL (ref 150–400)
RBC: 3.47 MIL/uL — ABNORMAL LOW (ref 3.87–5.11)
RDW: 14 % (ref 11.5–15.5)
WBC: 5.4 10*3/uL (ref 4.0–10.5)
nRBC: 0 % (ref 0.0–0.2)

## 2022-12-05 LAB — POCT I-STAT, CHEM 8
BUN: 3 mg/dL — ABNORMAL LOW (ref 6–20)
Calcium, Ion: 1.23 mmol/L (ref 1.15–1.40)
Chloride: 106 mmol/L (ref 98–111)
Creatinine, Ser: 0.5 mg/dL (ref 0.44–1.00)
Glucose, Bld: 123 mg/dL — ABNORMAL HIGH (ref 70–99)
HCT: 29 % — ABNORMAL LOW (ref 36.0–46.0)
Hemoglobin: 9.9 g/dL — ABNORMAL LOW (ref 12.0–15.0)
Potassium: 3.7 mmol/L (ref 3.5–5.1)
Sodium: 143 mmol/L (ref 135–145)
TCO2: 25 mmol/L (ref 22–32)

## 2022-12-05 LAB — COMPREHENSIVE METABOLIC PANEL
ALT: 22 U/L (ref 0–44)
AST: 22 U/L (ref 15–41)
Albumin: 3.7 g/dL (ref 3.5–5.0)
Alkaline Phosphatase: 52 U/L (ref 38–126)
Anion gap: 8 (ref 5–15)
BUN: <5 mg/dL — ABNORMAL LOW (ref 6–20)
CO2: 24 mmol/L (ref 22–32)
Calcium: 8.8 mg/dL — ABNORMAL LOW (ref 8.9–10.3)
Chloride: 107 mmol/L (ref 98–111)
Creatinine, Ser: 0.56 mg/dL (ref 0.44–1.00)
GFR, Estimated: >60 mL/min (ref >60)
Glucose, Bld: 90 mg/dL (ref 70–99)
Potassium: 4 mmol/L (ref 3.5–5.1)
Sodium: 139 mmol/L (ref 135–145)
Total Bilirubin: 0.7 mg/dL (ref 0.3–1.2)
Total Protein: 6.6 g/dL (ref 6.5–8.1)

## 2022-12-05 LAB — WET PREP, GENITAL
Clue Cells Wet Prep HPF POC: NONE SEEN
Sperm: NONE SEEN
Trich, Wet Prep: NONE SEEN
WBC, Wet Prep HPF POC: <10 (ref <10)
Yeast Wet Prep HPF POC: NONE SEEN

## 2022-12-05 LAB — TYPE AND SCREEN
ABO/RH(D): AB POS
Antibody Screen: NEGATIVE

## 2022-12-05 LAB — HCG, QUANTITATIVE, PREGNANCY: hCG, Beta Chain, Quant, S: 1195 m[IU]/mL — ABNORMAL HIGH (ref <5)

## 2022-12-05 SURGERY — LAPAROSCOPY, WITH ECTOPIC PREGNANCY SURGICAL TREATMENT
Anesthesia: General | Site: Abdomen

## 2022-12-05 MED ORDER — SUCCINYLCHOLINE CHLORIDE 200 MG/10ML IV SOSY
PREFILLED_SYRINGE | INTRAVENOUS | Status: DC | PRN
  Administered 2022-12-05: 160 mg via INTRAVENOUS

## 2022-12-05 MED ORDER — DEXMEDETOMIDINE HCL IN NACL 80 MCG/20ML IV SOLN
INTRAVENOUS | Status: AC
  Filled 2022-12-05: qty 20

## 2022-12-05 MED ORDER — FENTANYL CITRATE (PF) 100 MCG/2ML IJ SOLN
25.0000 ug | INTRAMUSCULAR | Status: DC | PRN
  Administered 2022-12-05: 25 ug via INTRAVENOUS

## 2022-12-05 MED ORDER — DEXAMETHASONE SODIUM PHOSPHATE 10 MG/ML IJ SOLN
INTRAMUSCULAR | Status: DC | PRN
  Administered 2022-12-05: 10 mg via INTRAVENOUS

## 2022-12-05 MED ORDER — GLYCOPYRROLATE PF 0.2 MG/ML IJ SOSY
PREFILLED_SYRINGE | INTRAMUSCULAR | Status: AC
  Filled 2022-12-05: qty 1

## 2022-12-05 MED ORDER — OXYCODONE HCL 5 MG PO TABS: 5.0000 mg | ORAL_TABLET | Freq: Once | ORAL | Status: DC | PRN

## 2022-12-05 MED ORDER — AMISULPRIDE (ANTIEMETIC) 5 MG/2ML IV SOLN
10.0000 mg | Freq: Once | INTRAVENOUS | Status: AC
  Administered 2022-12-05: 10 mg via INTRAVENOUS

## 2022-12-05 MED ORDER — FENTANYL CITRATE (PF) 250 MCG/5ML IJ SOLN
INTRAMUSCULAR | Status: DC | PRN
  Administered 2022-12-05: 50 ug via INTRAVENOUS
  Administered 2022-12-05: 100 ug via INTRAVENOUS
  Administered 2022-12-05 (×2): 50 ug via INTRAVENOUS

## 2022-12-05 MED ORDER — SUCCINYLCHOLINE CHLORIDE 200 MG/10ML IV SOSY
PREFILLED_SYRINGE | INTRAVENOUS | Status: AC
  Filled 2022-12-05: qty 10

## 2022-12-05 MED ORDER — BUPIVACAINE HCL (PF) 0.25 % IJ SOLN
INTRAMUSCULAR | Status: AC
  Filled 2022-12-05: qty 30

## 2022-12-05 MED ORDER — SODIUM CHLORIDE 0.9 % IV SOLN: INTRAVENOUS | Status: DC | PRN

## 2022-12-05 MED ORDER — ALBUTEROL SULFATE HFA 108 (90 BASE) MCG/ACT IN AERS
INHALATION_SPRAY | RESPIRATORY_TRACT | Status: DC | PRN
  Administered 2022-12-05: 4 via RESPIRATORY_TRACT

## 2022-12-05 MED ORDER — FENTANYL CITRATE (PF) 250 MCG/5ML IJ SOLN
INTRAMUSCULAR | Status: AC
  Filled 2022-12-05: qty 5

## 2022-12-05 MED ORDER — LIDOCAINE 2% (20 MG/ML) 5 ML SYRINGE
INTRAMUSCULAR | Status: DC | PRN
  Administered 2022-12-05: 80 mg via INTRAVENOUS

## 2022-12-05 MED ORDER — MIDAZOLAM HCL 2 MG/2ML IJ SOLN
INTRAMUSCULAR | Status: DC | PRN
  Administered 2022-12-05: 2 mg via INTRAVENOUS

## 2022-12-05 MED ORDER — OXYCODONE-ACETAMINOPHEN 5-325 MG PO TABS: 1.0000 | ORAL_TABLET | ORAL | 0 refills | Status: DC | PRN

## 2022-12-05 MED ORDER — PHENYLEPHRINE 80 MCG/ML (10ML) SYRINGE FOR IV PUSH (FOR BLOOD PRESSURE SUPPORT)
PREFILLED_SYRINGE | INTRAVENOUS | Status: DC | PRN
  Administered 2022-12-05: 80 ug via INTRAVENOUS

## 2022-12-05 MED ORDER — PROPOFOL 10 MG/ML IV BOLUS
INTRAVENOUS | Status: DC | PRN
  Administered 2022-12-05: 200 mg via INTRAVENOUS

## 2022-12-05 MED ORDER — LIDOCAINE 2% (20 MG/ML) 5 ML SYRINGE
INTRAMUSCULAR | Status: AC
  Filled 2022-12-05: qty 5

## 2022-12-05 MED ORDER — AMISULPRIDE (ANTIEMETIC) 5 MG/2ML IV SOLN
INTRAVENOUS | Status: AC
  Filled 2022-12-05: qty 4

## 2022-12-05 MED ORDER — CHLORHEXIDINE GLUCONATE 0.12 % MT SOLN
15.0000 mL | Freq: Once | OROMUCOSAL | Status: AC
  Administered 2022-12-05: 15 mL via OROMUCOSAL

## 2022-12-05 MED ORDER — PROPOFOL 10 MG/ML IV BOLUS
INTRAVENOUS | Status: AC
  Filled 2022-12-05: qty 20

## 2022-12-05 MED ORDER — SODIUM CHLORIDE 0.9 % IR SOLN
Status: DC | PRN
  Administered 2022-12-05: 1000 mL

## 2022-12-05 MED ORDER — LACTATED RINGERS IV SOLN: INTRAVENOUS | Status: DC

## 2022-12-05 MED ORDER — SUGAMMADEX SODIUM 200 MG/2ML IV SOLN
INTRAVENOUS | Status: DC | PRN
  Administered 2022-12-05: 200 mg via INTRAVENOUS

## 2022-12-05 MED ORDER — ORAL CARE MOUTH RINSE: 15.0000 mL | Freq: Once | OROMUCOSAL | Status: AC

## 2022-12-05 MED ORDER — DEXAMETHASONE SODIUM PHOSPHATE 10 MG/ML IJ SOLN
INTRAMUSCULAR | Status: AC
  Filled 2022-12-05: qty 1

## 2022-12-05 MED ORDER — ONDANSETRON HCL 4 MG/2ML IJ SOLN
INTRAMUSCULAR | Status: DC | PRN
  Administered 2022-12-05: 4 mg via INTRAVENOUS

## 2022-12-05 MED ORDER — ONDANSETRON HCL 4 MG/2ML IJ SOLN
INTRAMUSCULAR | Status: AC
  Filled 2022-12-05: qty 2

## 2022-12-05 MED ORDER — 0.9 % SODIUM CHLORIDE (POUR BTL) OPTIME
TOPICAL | Status: DC | PRN
  Administered 2022-12-05: 1000 mL

## 2022-12-05 MED ORDER — OXYCODONE HCL 5 MG/5ML PO SOLN: 5.0000 mg | Freq: Once | ORAL | Status: DC | PRN

## 2022-12-05 MED ORDER — PHENYLEPHRINE 80 MCG/ML (10ML) SYRINGE FOR IV PUSH (FOR BLOOD PRESSURE SUPPORT)
PREFILLED_SYRINGE | INTRAVENOUS | Status: AC
  Filled 2022-12-05: qty 10

## 2022-12-05 MED ORDER — KETOROLAC TROMETHAMINE 30 MG/ML IJ SOLN
INTRAMUSCULAR | Status: AC
  Filled 2022-12-05: qty 1

## 2022-12-05 MED ORDER — ONDANSETRON HCL 4 MG/2ML IJ SOLN: 4.0000 mg | Freq: Once | INTRAMUSCULAR | Status: DC | PRN

## 2022-12-05 MED ORDER — GLYCOPYRROLATE 0.2 MG/ML IJ SOLN
INTRAMUSCULAR | Status: DC | PRN
  Administered 2022-12-05: .2 mg via INTRAVENOUS

## 2022-12-05 MED ORDER — MIDAZOLAM HCL 2 MG/2ML IJ SOLN
INTRAMUSCULAR | Status: AC
  Filled 2022-12-05: qty 2

## 2022-12-05 MED ORDER — ALBUMIN HUMAN 5 % IV SOLN: INTRAVENOUS | Status: DC | PRN

## 2022-12-05 MED ORDER — BUPIVACAINE HCL (PF) 0.25 % IJ SOLN
INTRAMUSCULAR | Status: DC | PRN
  Administered 2022-12-05: 8 mL

## 2022-12-05 MED ORDER — ROCURONIUM BROMIDE 10 MG/ML (PF) SYRINGE
PREFILLED_SYRINGE | INTRAVENOUS | Status: AC
  Filled 2022-12-05: qty 10

## 2022-12-05 MED ORDER — ROCURONIUM BROMIDE 10 MG/ML (PF) SYRINGE
PREFILLED_SYRINGE | INTRAVENOUS | Status: DC | PRN
  Administered 2022-12-05: 20 mg via INTRAVENOUS
  Administered 2022-12-05: 50 mg via INTRAVENOUS

## 2022-12-05 MED ORDER — KETOROLAC TROMETHAMINE 30 MG/ML IJ SOLN
30.0000 mg | Freq: Once | INTRAMUSCULAR | Status: AC | PRN
  Administered 2022-12-05: 30 mg via INTRAVENOUS

## 2022-12-05 MED ORDER — FENTANYL CITRATE (PF) 100 MCG/2ML IJ SOLN
INTRAMUSCULAR | Status: AC
  Filled 2022-12-05: qty 2

## 2022-12-05 SURGICAL SUPPLY — 37 items
ADH SKN CLS APL DERMABOND .7 (GAUZE/BANDAGES/DRESSINGS) ×1
APL SWBSTK 6 STRL LF DISP (MISCELLANEOUS) ×2
APPLICATOR COTTON TIP 6 STRL (MISCELLANEOUS) IMPLANT
APPLICATOR COTTON TIP 6IN STRL (MISCELLANEOUS) ×2 IMPLANT
CABLE HIGH FREQUENCY MONO STRZ (ELECTRODE) IMPLANT
DERMABOND ADVANCED .7 DNX12 (GAUZE/BANDAGES/DRESSINGS) IMPLANT
DURAPREP 26ML APPLICATOR (WOUND CARE) ×1 IMPLANT
GLOVE BIO SURGEON STRL SZ 6.5 (GLOVE) ×1 IMPLANT
GLOVE BIOGEL PI IND STRL 7.0 (GLOVE) ×4 IMPLANT
GOWN STRL REUS W/ TWL LRG LVL3 (GOWN DISPOSABLE) ×2 IMPLANT
GOWN STRL REUS W/TWL LRG LVL3 (GOWN DISPOSABLE) ×2
IRRIG SUCT STRYKERFLOW 2 WTIP (MISCELLANEOUS) ×1
IRRIGATION SUCT STRKRFLW 2 WTP (MISCELLANEOUS) IMPLANT
KIT PINK PAD W/HEAD ARE REST (MISCELLANEOUS) IMPLANT
KIT PINK PAD W/HEAD ARM REST (MISCELLANEOUS) ×1 IMPLANT
KIT TURNOVER KIT B (KITS) ×1 IMPLANT
NDL INSUFFLATION 14GA 120MM (NEEDLE) ×1 IMPLANT
NEEDLE INSUFFLATION 14GA 120MM (NEEDLE) ×1 IMPLANT
NS IRRIG 1000ML POUR BTL (IV SOLUTION) ×1 IMPLANT
PACK LAPAROSCOPY BASIN (CUSTOM PROCEDURE TRAY) ×1 IMPLANT
PROTECTOR NERVE ULNAR (MISCELLANEOUS) ×2 IMPLANT
SET TUBE SMOKE EVAC HIGH FLOW (TUBING) ×1 IMPLANT
SHEARS HARMONIC ACE PLUS 36CM (ENDOMECHANICALS) IMPLANT
SLEEVE Z-THREAD 5X100MM (TROCAR) ×1 IMPLANT
STRIP CLOSURE SKIN 1/2X4 (GAUZE/BANDAGES/DRESSINGS) IMPLANT
SUT VICRYL 0 UR6 27IN ABS (SUTURE) ×1 IMPLANT
SUT VICRYL 4-0 PS2 18IN ABS (SUTURE) ×1 IMPLANT
SYS BAG RETRIEVAL 10MM (BASKET) ×1
SYSTEM BAG RETRIEVAL 10MM (BASKET) IMPLANT
TOWEL GREEN STERILE FF (TOWEL DISPOSABLE) ×2 IMPLANT
TRAY FOLEY W/BAG SLVR 14FR (SET/KITS/TRAYS/PACK) ×1 IMPLANT
TROCAR 11X100 Z THREAD (TROCAR) ×1 IMPLANT
TROCAR OPTI TIP 5M 100M (ENDOMECHANICALS) IMPLANT
TROCAR XCEL DIL TIP R 11M (ENDOMECHANICALS) ×1 IMPLANT
TROCAR XCEL NON-BLD 5MMX100MML (ENDOMECHANICALS) ×1 IMPLANT
TROCAR Z THREAD OPTICAL 5X150 (TROCAR) IMPLANT
WARMER LAPAROSCOPE (MISCELLANEOUS) ×1 IMPLANT

## 2022-12-05 NOTE — Transfer of Care (Signed)
Immediate Anesthesia Transfer of Care Note  Patient: Lindsey Wells  Procedure(s) Performed: DIAGNOSTIC LAPAROSCOPY WITH REMOVAL OF ECTOPIC PREGNANCY AND FALLOPIAN TUBE (Abdomen)  Patient Location: PACU  Anesthesia Type:General  Level of Consciousness: awake, oriented, and drowsy  Airway & Oxygen Therapy: Patient Spontanous Breathing  Post-op Assessment: Report given to RN, Post -op Vital signs reviewed and stable, and Patient moving all extremities  Post vital signs: stable  Last Vitals:  Vitals Value Taken Time  BP 118/66 12/05/22 1734  Temp    Pulse 81 12/05/22 1739  Resp 15 12/05/22 1739  SpO2 100 % 12/05/22 1739  Vitals shown include unvalidated device data.  Last Pain:  Vitals:   12/05/22 1522  TempSrc: Oral  PainSc: 0-No pain         Complications: There were no known notable events for this encounter.

## 2022-12-05 NOTE — MAU Note (Signed)
Lindsey Wells is a 42 y.o. at Unknown here in MAU reporting: she had a +UPT today at Urgent Care, was seen for abdominal pain but has had VB and cramping and was sent to MAU for evaluation.  Reports VB is currently spotting, and bled the entire month of June with the heaviest being from June 2-12.  States had a IUD and removed it on November 27, 2022. LMP: 10/29/2022 Onset of complaint: 5 days ago Pain score: 6 Vitals:   12/05/22 0948 12/05/22 1030  BP: 135/78 113/69  Pulse: 65 67  Resp: 16 18  Temp: 99.3 F (37.4 C) 98 F (36.7 C)  SpO2: 96% 97%     FHT:NA Lab orders placed from triage:   UA

## 2022-12-05 NOTE — Op Note (Signed)
Lindsey Wells PROCEDURE DATE: 12/05/2022  PREOPERATIVE DIAGNOSIS: Ruptured ectopic pregnancy POSTOPERATIVE DIAGNOSIS: Ruptured right fallopian tube ectopic pregnancy PROCEDURE: Laparoscopic right salpingectomy and removal of ectopic pregnancy SURGEON:  Adam Phenix, MD ANESTHESIOLOGY TEAM: Anesthesiologist: Eilene Ghazi, MD CRNA: Kayleen Memos, CRNA  INDICATIONS: 42 y.o. [id number] at Unknown here with the preoperative diagnoses as listed above.  Please refer to preoperative notes for more details. Patient was counseled regarding need for laparoscopic salpingectomy. Risks of surgery including bleeding which may require transfusion or reoperation, infection, injury to bowel or other surrounding organs, need for additional procedures including laparotomy and other postoperative/anesthesia complications were explained to patient.  Written informed consent was obtained.  FINDINGS:  moderate amount of hemoperitoneum estimated to be about 75 ml of blood and clots.  Dilated right fallopian tube containing ectopic gestation. Small normal appearing uterus, normal left fallopian tube, left ovary and right ovary.  ANESTHESIA: General INTRAVENOUS FLUIDS: 1000 ml ESTIMATED BLOOD LOSS: 100 ml URINE OUTPUT: 100 ml SPECIMENS: Right fallopian tube containing ectopic gestation COMPLICATIONS: None immediate  PROCEDURE IN DETAIL:  The patient was taken to the operating room where general anesthesia was administered and was found to be adequate.  She was placed in the dorsal lithotomy position, and was prepped and draped in a sterile manner.  A Foley catheter was inserted into her bladder and attached to constant drainage and a uterine manipulator was then advanced into the uterus .    After an adequate timeout was performed, attention was turned to the abdomen where an umbilical incision was made with the scalpel.  The Optiview 11-mm trocar and sleeve were then advanced without difficulty with the  laparoscope under direct visualization into the abdomen.  The abdomen was then insufflated with carbon dioxide gas and adequate pneumoperitoneum was obtained.  A survey of the patient's pelvis and abdomen revealed the findings above.   5-mm  left and right lower quadrant ports were then placed under direct visualization.  The Nezhat suction irrigator was then used to suction the hemoperitoneum and irrigate the pelvis.  Attention was then turned to the right fallopian tube which was grasped and ligated from the underlying mesosalpinx and uterine attachment using the Harmonic instrument.  Good hemostasis was noted.  The specimen was placed in an EndoCatch bag and removed from the abdomen intact.  The abdomen was desufflated, and all instruments were removed.  The fascial incision of the 11-mm site was reapproximated with a 0 Vicryl figure-of-eight stitch; and all skin incisions were closed with 4-0 Vicryl and Dermabond. The patient tolerated the procedure well.  Sponge, lap, and needle counts were correct times three.  The patient was then taken to the recovery room awake, extubated and in stable condition.   The patient will be discharged to home as per PACU criteria.  Routine postoperative instructions given.  She was prescribed Percocet, Ibuprofen.  She will follow up in the office in about 2-3 weeks for postoperative evaluation.    Adam Phenix, MD Obstetrician & Gynecologist, [facility], [facility]

## 2022-12-05 NOTE — Anesthesia Preprocedure Evaluation (Addendum)
Anesthesia Evaluation  Patient identified by MRN, date of birth, ID band Patient awake    Reviewed: Allergy & Precautions, H&P , NPO status , Patient's Chart, lab work & pertinent test results  Airway Mallampati: II  TM Distance: >3 FB Neck ROM: Full    Dental no notable dental hx.    Pulmonary former smoker   Pulmonary exam normal breath sounds clear to auscultation       Cardiovascular negative cardio ROS Normal cardiovascular exam Rhythm:Regular Rate:Normal     Neuro/Psych negative neurological ROS  negative psych ROS   GI/Hepatic negative GI ROS, Neg liver ROS,,,  Endo/Other  negative endocrine ROS    Renal/GU negative Renal ROS  negative genitourinary   Musculoskeletal negative musculoskeletal ROS (+)    Abdominal   Peds negative pediatric ROS (+)  Hematology negative hematology ROS (+)   Anesthesia Other Findings   Reproductive/Obstetrics negative OB ROS                             Anesthesia Physical Anesthesia Plan  ASA: 2 and emergent  Anesthesia Plan: General   Post-op Pain Management:    Induction: Intravenous and Rapid sequence  PONV Risk Score and Plan: 3 and Ondansetron, Dexamethasone and Midazolam  Airway Management Planned: Oral ETT  Additional Equipment:   Intra-op Plan:   Post-operative Plan: Extubation in OR  Informed Consent: I have reviewed the patients History and Physical, chart, labs and discussed the procedure including the risks, benefits and alternatives for the proposed anesthesia with the patient or authorized representative who has indicated his/her understanding and acceptance.     Dental advisory given  Plan Discussed with: CRNA and Surgeon  Anesthesia Plan Comments:        Anesthesia Quick Evaluation

## 2022-12-05 NOTE — H&P (Signed)
Lindsey Wells is an 42 y.o. female. [id number] Patient's last menstrual period was 10/29/2022. Patient had positive pregnancy test today at Urgent care. She had lower abdominal pain since yesterday and she last at at 1600. IUD had been in place for 4 years and was removed at the HD 11/27/22 due to pain.   Pertinent Gynecological History: Menses:  last period was prolonged Bleeding: yes Contraception: IUD  Sexually transmitted diseases: no past history  OB History     Gravida  5   Para  3   Term  2   Preterm  1   AB      Living  2      SAB  0   IAB  0   Ectopic  0   Multiple  0   Live Births  3          Past Surgical History:  Procedure Laterality Date   CESAREAN SECTION N/A 01/01/2016   Procedure: CESAREAN SECTION;  Surgeon: Reva Bores, MD;  Location: [facility] BIRTHING SUITES;  Service: Obstetrics;  Laterality: N/A;   CESAREAN SECTION N/A 12/20/2016   Procedure: REPEAT CESAREAN SECTION;  Surgeon: Tereso Newcomer, MD;  Location: [facility] BIRTHING SUITES;  Service: Obstetrics;  Laterality: N/A;   CESAREAN SECTION N/A 08/17/2018   Procedure: CESAREAN SECTION;  Surgeon: Catalina Antigua, MD;  Location: [facility] LD ORS;  Service: Obstetrics;  Laterality: N/A;     Menstrual History:  Patient's last menstrual period was 10/29/2022.    Past Medical History:  Diagnosis Date   Preterm labor     Past Surgical History:  Procedure Laterality Date   CESAREAN SECTION N/A 01/01/2016   Procedure: CESAREAN SECTION;  Surgeon: Reva Bores, MD;  Location: [facility] BIRTHING SUITES;  Service: Obstetrics;  Laterality: N/A;   CESAREAN SECTION N/A 12/20/2016   Procedure: REPEAT CESAREAN SECTION;  Surgeon: Tereso Newcomer, MD;  Location: [facility] BIRTHING SUITES;  Service: Obstetrics;  Laterality: N/A;   CESAREAN SECTION N/A 08/17/2018   Procedure: CESAREAN SECTION;  Surgeon: Catalina Antigua, MD;  Location: [facility] LD ORS;  Service: Obstetrics;  Laterality: N/A;    Family History  Problem Relation  Age of Onset   Diabetes Father    Hypertension Father     Social History:  reports that she has quit smoking. She has never used smokeless tobacco. She reports that she does not drink alcohol and does not use drugs.  Allergies: No Known Allergies  Medications Prior to Admission  Medication Sig Dispense Refill Last Dose   acetaminophen (TYLENOL) 325 MG tablet Take 650 mg by mouth every 6 (six) hours as needed for moderate pain.       ibuprofen (ADVIL,MOTRIN) 800 MG tablet Take 1 tablet (800 mg total) by mouth every 8 (eight) hours. 60 tablet 0    norethindrone (MICRONOR) 0.35 MG tablet Take 1 tablet (0.35 mg total) by mouth daily. 1 Package 11    Prenatal Vit-Fe Fumarate-FA (MULTIVITAMIN-PRENATAL) 27-0.8 MG TABS tablet Take 1 tablet by mouth daily. 90 each 3    ranitidine (ZANTAC) 75 MG tablet Take 75 mg by mouth daily as needed for heartburn.       senna-docusate (SENOKOT-S) 8.6-50 MG tablet Take 2 tablets by mouth daily. 30 tablet 0     Review of Systems  Constitutional:  Positive for appetite change.  Respiratory: Negative.    Cardiovascular: Negative.   Gastrointestinal:  Positive for abdominal pain. Negative for nausea.  Genitourinary:  Positive for pelvic pain and vaginal  bleeding. Negative for vaginal discharge.    Blood pressure 125/68, pulse (!) 58, temperature 98 F (36.7 C), temperature source Oral, resp. rate 18, height 5\' 5"  (1.651 m), weight 101.4 kg, last menstrual period 10/29/2022, SpO2 97 %, unknown if currently breastfeeding. Physical Exam Vitals and nursing note reviewed. Exam conducted with a chaperone present.  Constitutional:      Appearance: She is well-developed.  Abdominal:     General: Abdomen is flat.     Palpations: Abdomen is soft.     Tenderness: There is abdominal tenderness in the right lower quadrant. There is rebound (mild RLQ).  Genitourinary:    Adnexa: Right adnexa normal and left adnexa normal.  Skin:    General: Skin is warm and dry.   Neurological:     General: No focal deficit present.     Mental Status: She is alert.  Psychiatric:        Mood and Affect: Mood normal.        Behavior: Behavior normal.     Results for orders placed or performed during the hospital encounter of 12/05/22 (from the past 24 hour(s))  Urinalysis, Routine w reflex microscopic -Urine, Clean Catch     Status: Abnormal   Collection Time: 12/05/22 11:02 AM  Result Value Ref Range   Color, Urine RED (A) YELLOW   APPearance HAZY (A) CLEAR   Specific Gravity, Urine 1.009 1.005 - 1.030   pH 7.0 5.0 - 8.0   Glucose, UA NEGATIVE NEGATIVE mg/dL   Hgb urine dipstick MODERATE (A) NEGATIVE   Bilirubin Urine NEGATIVE NEGATIVE   Ketones, ur NEGATIVE NEGATIVE mg/dL   Protein, ur 161 (A) NEGATIVE mg/dL   Nitrite NEGATIVE NEGATIVE   Leukocytes,Ua NEGATIVE NEGATIVE   RBC / HPF >50 0 - 5 RBC/hpf   WBC, UA 0-5 0 - 5 WBC/hpf   Bacteria, UA NONE SEEN NONE SEEN   Squamous Epithelial / HPF 6-10 0 - 5 /HPF  Wet prep, genital     Status: None   Collection Time: 12/05/22 11:31 AM   Specimen: PATH Cytology Cervicovaginal Ancillary Only  Result Value Ref Range   Yeast Wet Prep HPF POC NONE SEEN NONE SEEN   Trich, Wet Prep NONE SEEN NONE SEEN   Clue Cells Wet Prep HPF POC NONE SEEN NONE SEEN   WBC, Wet Prep HPF POC <10 <10   Sperm NONE SEEN   Type and screen [facility]     Status: None   Collection Time: 12/05/22 12:35 PM  Result Value Ref Range   ABO/RH(D) AB POS    Antibody Screen NEG    Sample Expiration      12/08/2022,2359 Performed at [facility], [address], [place], [place] [address]   CBC     Status: Abnormal   Collection Time: 12/05/22 12:35 PM  Result Value Ref Range   WBC 5.4 4.0 - 10.5 K/uL   RBC 3.47 (L) 3.87 - 5.11 MIL/uL   Hemoglobin 10.2 (L) 12.0 - 15.0 g/dL   HCT 54.0 (L) 98.1 - 19.1 %   MCV 85.9 80.0 - 100.0 fL   MCH 29.4 26.0 - 34.0 pg   MCHC 34.2 30.0 - 36.0 g/dL   RDW 47.8 29.5 - 62.1 %    Platelets 260 150 - 400 K/uL   nRBC 0.0 0.0 - 0.2 %  hCG, quantitative, pregnancy     Status: Abnormal   Collection Time: 12/05/22 12:55 PM  Result Value Ref Range  hCG, Beta Chain, Quant, S 1,195 (H) <5 mIU/mL    US OB Transvaginal  Addendum Date: 12/05/2022   ADDENDUM REPORT: 12/05/2022 12:57 ADDENDUM: Critical Value/emergent results were discussed by telephone on 12/05/2022 at 12:52 pm with provider Lyndel Safe, MD, who verbally acknowledged these results. Electronically Signed   By: Duanne Guess D.O.   On: 12/05/2022 12:57   Result Date: 12/05/2022 CLINICAL DATA:  Vaginal bleeding.  Positive urinary pregnancy test EXAM: TRANSVAGINAL OB ULTRASOUND TECHNIQUE: Transvaginal ultrasound was performed for complete evaluation of the gestation as well as the maternal uterus, adnexal regions, and pelvic cul-de-sac. COMPARISON:  None Available. FINDINGS: Intrauterine gestational sac: None Maternal uterus/adnexae: Anteverted uterus. No intrauterine gestational sac. Endometrial stripe measures 5.5 mm. Complex right adnexal mass containing a 1.5 cm ring-like echogenic structure. In total, right adnexal mass measures 5.7 x 4.1 x 5.3 cm. The right ovary was not definitively visualized. No embryo or yolk sac is seen within the right adnexal mass. Left ovary was not visualized. Moderate-large volume of free fluid within the pelvis with low level internal echoes. IMPRESSION: 1. Examination is highly suspicious for ruptured ectopic pregnancy in the setting of positive pregnancy test. Complex right adnexal mass measuring up to 5.7 cm with probable gestational sac. No identifiable yolk sac or embryo. 2. Moderate-large volume hemoperitoneum within the pelvis. Ordering provider has been paged. Documentation of the communication of the above findings will be added to the report in the form of an addendum. Electronically Signed: By: Duanne Guess D.O. On: 12/05/2022 12:16    Assessment/Plan: Suspected ruptured  ectopic pregnancy. [place] images reviewed. She needs laparoscopic removal of the ectopic with probable salpingectomy. Patient desires surgical management.  The risks of surgery were discussed in detail with the patient including but not limited to: bleeding which may require transfusion or reoperation; infection which may require prolonged hospitalization or re-hospitalization and antibiotic therapy; injury to bowel, bladder, ureters and major vessels or other surrounding organs which may lead to other procedures; formation of adhesions; need for additional procedures including laparotomy or subsequent procedures secondary to intraoperative injury or abnormal pathology; thromboembolic phenomenon; incisional problems and other postoperative or anesthesia complications.  Patient was told that the likelihood that her condition and symptoms will be treated effectively with this surgical management was very high; the postoperative expectations were also discussed in detail. The patient also understands the alternative treatment options which were discussed in full. All questions were answered.    Scheryl Darter 12/05/2022, 2:06 PM

## 2022-12-05 NOTE — ED Triage Notes (Signed)
Using medical interpreter- patient arrives ambulatory by POV c/o abdominal pain and vaginal bleeding x 1 month. Patient states she went to [facility] and had positive pregnancy test. C/o mid abdominal pain since June 30th. States she had a copper IUD removed last month. Reports intermittent constipation and diarrhea.

## 2022-12-05 NOTE — Anesthesia Procedure Notes (Addendum)
Procedure Name: Intubation Date/Time: 12/05/2022 3:41 PM  Performed by: Kayleen Memos, CRNAPre-anesthesia Checklist: Patient identified, Emergency Drugs available, Suction available, Patient being monitored and Timeout performed Patient Re-evaluated:Patient Re-evaluated prior to induction Oxygen Delivery Method: Circle system utilized Preoxygenation: Pre-oxygenation with 100% oxygen Induction Type: IV induction, Rapid sequence and Cricoid Pressure applied Laryngoscope Size: Glidescope Grade View: Grade I Tube type: Oral Tube size: 7.0 mm Number of attempts: 1 Airway Equipment and Method: Stylet and Video-laryngoscopy Placement Confirmation: ETT inserted through vocal cords under direct vision, CO2 detector, breath sounds checked- equal and bilateral and positive ETCO2 Secured at: 22 cm Tube secured with: Tape Dental Injury: Teeth and Oropharynx as per pre-operative assessment

## 2022-12-06 ENCOUNTER — Encounter (HOSPITAL_COMMUNITY): Payer: Self-pay | Admitting: Obstetrics & Gynecology

## 2022-12-06 LAB — GC/CHLAMYDIA PROBE AMP (~~LOC~~) NOT AT ARMC
Chlamydia: NEGATIVE
Comment: NEGATIVE
Comment: NORMAL
Neisseria Gonorrhea: NEGATIVE

## 2022-12-06 NOTE — Anesthesia Postprocedure Evaluation (Signed)
Anesthesia Post Note  Patient: Lindsey Wells  Procedure(s) Performed: DIAGNOSTIC LAPAROSCOPY WITH REMOVAL OF ECTOPIC PREGNANCY AND FALLOPIAN TUBE (Abdomen)     Patient location during evaluation: PACU Anesthesia Type: General Level of consciousness: awake and alert Pain management: pain level controlled Vital Signs Assessment: post-procedure vital signs reviewed and stable Respiratory status: spontaneous breathing, nonlabored ventilation, respiratory function stable and patient connected to nasal cannula oxygen Cardiovascular status: blood pressure returned to baseline and stable Postop Assessment: no apparent nausea or vomiting Anesthetic complications: no  There were no known notable events for this encounter.  Last Vitals:  Vitals:   12/05/22 1800 12/05/22 1815  BP: 112/61 119/62  Pulse: (!) 55 62  Resp: 15 14  Temp:  36.7 C  SpO2: 98% 100%    Last Pain:  Vitals:   12/05/22 1745  TempSrc:   PainSc: 5                  Kira Hartl S

## 2022-12-07 LAB — SURGICAL PATHOLOGY

## 2023-03-20 ENCOUNTER — Encounter: Payer: Self-pay | Admitting: Internal Medicine

## 2023-03-20 ENCOUNTER — Ambulatory Visit: Payer: Self-pay | Admitting: Internal Medicine

## 2023-03-20 VITALS — BP 140/90 | HR 76 | Resp 16 | Ht 65.0 in | Wt 230.0 lb

## 2023-03-20 DIAGNOSIS — E669 Obesity, unspecified: Secondary | ICD-10-CM | POA: Insufficient documentation

## 2023-03-20 DIAGNOSIS — Z23 Encounter for immunization: Secondary | ICD-10-CM

## 2023-03-20 DIAGNOSIS — E282 Polycystic ovarian syndrome: Secondary | ICD-10-CM

## 2023-03-20 MED ORDER — METFORMIN HCL ER 500 MG PO TB24: 500.0000 mg | ORAL_TABLET | Freq: Every day | ORAL | 11 refills | Status: DC

## 2023-03-20 NOTE — Progress Notes (Signed)
    Subjective:    Patient ID: Lindsey Wells, female   DOB: [birth year], 42 y.o.   MRN: [id number]   HPI  Tereasa Coop interprets   Concerns about hormones:  having bodyaches for about past 20 days.  Feels sleepy and legs hurt.  Also, was not aware of how much she weighed.  Generally, prior to pregancy, felt she was max about 212.  Would at times go down to 180 lbs.   Has noted her hair to fall out more and her facial and feet skin is dry.    When had IUD, her periods were regular, but heavy.   Prior to IUD or any other hormones, her periods were regular. IUD removed July 1 due to pain and developed right tubal pregnancy for which she underwent right Salpingectomy and removal of gestational tissue as well on 12/05/2022.   Had a period in August 22 to 25th.  Last period was Sept 17th to 20th.   Not hot flashes or night sweats.   States in past when she has gained weight, she stops having periods.   She is not physically active, though has been in past with dance and weight lifting.   She admits she has also not been eating in a good way--lots of breads and sweetened foods/desserts.   No constipation or diarrhea. She does feel stressed since her ectopic pregnancy--feels stressed.    Being physically active helps with stress   Gives history of being diagnosed with PCOS around 2010.  Treated with hormones.  She was having amenorrhea for a year and increased acne on her face/chin.  Also had a beard.  States the hair growth has never resolved.     No outpatient medications have been marked as taking for the 03/20/23 encounter (Office Visit) with Julieanne Manson, MD.   No Known Allergies   Review of Systems    Objective:   BP (!) 140/90 (BP Location: Right Arm, Patient Position: Sitting, Cuff Size: Normal)   Pulse 76   Resp 16   Ht 5\' 5"  (1.651 m)   Wt 230 lb (104.3 kg)   LMP 02/13/2023 (Exact Date)   Breastfeeding Unknown   BMI 38.27 kg/m   Physical  Exam  NAD Obese HEENT;  PERRL, EOMI Neck:  Supple, NO adenopathy, no thyromegaly Lungs:  CTA CV:  RRR with normal S1 and S2, No S3, S4 or murmur.  No carotid bruits.  Carotid, radial and DP pulses normal and equal. Abd:  S, NT, No HSM or mass, + BS LE:  No edema    Assessment & Plan   Obesity:  discussed diet and physical activity at length.  Encouraged making small weekly goal to improve both.  Check A1C and FLP., TSH  2.  PCOS:  Recs and Labs above.  Metformin 24 hour release 500 mg once daily.  Follow up in 3  months   3.  HM: influenza vaccine.  Encouraged COVID booster.

## 2023-03-20 NOTE — Patient Instructions (Addendum)
Tome un vaso de agua antes de cada comida Tome un minimo de 6 a 8 vasos de agua diarios Coma tres veces al dia Coma una proteina y Ardelia Mems grasa saludable con comida.  (huevos, pescado, pollo, pavo, y limite carnes rojas Coma 5 porciones diarias de legumbres.  Mezcle los colores Coma 2 porciones diarias de frutas con cascara cuando sea comestible Use platos pequeos Suelte su tenedor o cuchara despues de cada mordida hata que se mastique y se trague Come en la mesa con amigos o familiares por lo menos una vez al dia Apague la televisin y aparatos electrnicos durante la comida  Su objetivo debe ser perder una libra por semana  Estudios recientes indican que las personas quienes consumen todos de sus calorias durante 12 horas se bajan de pesocon Mas eficiencia.  Por ejemplo, si Usted come su primera comida a las 7:00 a.m., su comida final del dia se debe completar antes de las 7:00 p.m.  Metas!!!

## 2023-03-29 ENCOUNTER — Other Ambulatory Visit: Payer: Self-pay

## 2023-03-29 DIAGNOSIS — E669 Obesity, unspecified: Secondary | ICD-10-CM

## 2023-03-30 LAB — HEMOGLOBIN A1C
Est. average glucose Bld gHb Est-mCnc: 128 mg/dL
Hgb A1c MFr Bld: 6.1 % — ABNORMAL HIGH (ref 4.8–5.6)

## 2023-03-30 LAB — LIPID PANEL W/O CHOL/HDL RATIO
Cholesterol, Total: 184 mg/dL (ref 100–199)
HDL: 36 mg/dL — ABNORMAL LOW (ref >39)
LDL Chol Calc (NIH): 116 mg/dL — ABNORMAL HIGH (ref 0–99)
Triglycerides: 183 mg/dL — ABNORMAL HIGH (ref 0–149)
VLDL Cholesterol Cal: 32 mg/dL (ref 5–40)

## 2023-03-30 LAB — TSH: TSH: 2.59 u[IU]/mL (ref 0.450–4.500)

## 2023-06-25 ENCOUNTER — Other Ambulatory Visit (INDEPENDENT_AMBULATORY_CARE_PROVIDER_SITE_OTHER): Payer: Self-pay

## 2023-06-25 DIAGNOSIS — R6889 Other general symptoms and signs: Secondary | ICD-10-CM

## 2023-06-25 LAB — POC COVID19 BINAXNOW: SARS Coronavirus 2 Ag: NEGATIVE

## 2023-06-25 LAB — POCT INFLUENZA A/B
Influenza A, POC: POSITIVE — AB
Influenza B, POC: NEGATIVE

## 2023-06-25 MED ORDER — OSELTAMIVIR PHOSPHATE 75 MG PO CAPS: 75.0000 mg | ORAL_CAPSULE | Freq: Two times a day (BID) | ORAL | 0 refills | Status: AC

## 2023-06-25 NOTE — Progress Notes (Signed)
Patient has been experiencing body aches, chest congestion, sneezing and headache since 06/23/2023. Has been taking tylenol which helps some.  Tamiflu 75mg  BID for 5 days has been sent to the pharmacy. Patient has been notified of Rx

## 2023-07-18 ENCOUNTER — Ambulatory Visit: Payer: Self-pay | Admitting: Internal Medicine

## 2023-07-18 ENCOUNTER — Encounter: Payer: Self-pay | Admitting: Internal Medicine

## 2023-07-18 VITALS — BP 140/92 | HR 73 | Ht 65.0 in | Wt 222.0 lb

## 2023-07-18 DIAGNOSIS — D649 Anemia, unspecified: Secondary | ICD-10-CM

## 2023-07-18 DIAGNOSIS — E669 Obesity, unspecified: Secondary | ICD-10-CM

## 2023-07-18 DIAGNOSIS — R7303 Prediabetes: Secondary | ICD-10-CM

## 2023-07-18 DIAGNOSIS — E282 Polycystic ovarian syndrome: Secondary | ICD-10-CM

## 2023-07-18 DIAGNOSIS — R030 Elevated blood-pressure reading, without diagnosis of hypertension: Secondary | ICD-10-CM

## 2023-07-18 MED ORDER — METFORMIN HCL ER 500 MG PO TB24: ORAL_TABLET | ORAL | 11 refills | Status: AC

## 2023-07-18 NOTE — Progress Notes (Signed)
    Subjective:    Patient ID: Lindsey Wells, female   DOB: 07/19/1980, 43 y.o.   MRN: [id number]   HPI  Amparo Bristol interprets   PCOS/prediabetes/obesity:  taking Metformin regularly.  No period in November.  Had period in December starting on the 8th lasting 4 days.  Not until Jan 28th lasting 6 days.  Has lost 8 lbs since October.  Is now regularly physically active 5 days of week and is eating less since starting Metformin.    2.  Elevated BP without diagnosis of hypertension:  she does get anxious when coming in to see doctor.  Is anxious today.    3.  Dyslipidemia:  Lifestyle changes as above.  4.  Anemia:  9.9 hgb after her ectopic pregnancy in July.  Has not been rechecked.  Energy is good.    Current Meds  Medication Sig   metFORMIN (GLUCOPHAGE-XR) 500 MG 24 hr tablet Take 1 tablet (500 mg total) by mouth daily with breakfast.   No Known Allergies   Review of Systems    Objective:   BP (!) 140/92 (BP Location: Right Arm, Patient Position: Sitting, Cuff Size: Normal)   Pulse 73   Ht 5\' 5"  (1.651 m)   Wt 222 lb (100.7 kg)   LMP 06/26/2023 (Exact Date)   SpO2 98%   Breastfeeding No   BMI 36.94 kg/m   Physical Exam NAD HEENT:  PERRL, EOMI.  Palpebral conjunctivae with good coloration Neck;  Supple No adenopathy Chest:  CTA CV:  RRR without murmur or rub.  Radial and DP pulses normal Palmar skin with good coloration.   Assessment & Plan   PCOS/prediabetes/obesity:  Increase Metformin XR to 500 mg twice daily and continue with lifestyle changes.  A1C in 3 months and will also see if periods are more regulated.  2.  Dyslipidemia:  FLP in 3 months  3.  Anemia:  Likely without anemia with good coloration.  CBC with next labs  4.  Elevated BP:  anxious when being seen in office.  Will have her return for BP check with nursing.

## 2023-08-01 ENCOUNTER — Telehealth: Payer: Self-pay

## 2023-08-01 ENCOUNTER — Ambulatory Visit: Payer: Self-pay

## 2023-08-01 VITALS — BP 132/82 | HR 88

## 2023-08-01 DIAGNOSIS — Z0130 Encounter for examination of blood pressure without abnormal findings: Secondary | ICD-10-CM

## 2023-08-01 NOTE — Telephone Encounter (Signed)
 Patient needs an appointment for patient stated her eyes hurt and also she  noticed six days ago that she is having trouble with vision while walking, patient stated she sees things double with her right eye only while walking, patient does not have same issue while driving.  We will call patient if there is a cancellation.

## 2023-08-02 ENCOUNTER — Encounter: Payer: Self-pay | Admitting: Internal Medicine

## 2023-08-02 ENCOUNTER — Ambulatory Visit: Payer: Self-pay | Admitting: Internal Medicine

## 2023-08-02 VITALS — BP 140/94 | HR 64 | Resp 16 | Ht 65.0 in | Wt 222.0 lb

## 2023-08-02 DIAGNOSIS — H539 Unspecified visual disturbance: Secondary | ICD-10-CM

## 2023-08-02 DIAGNOSIS — J019 Acute sinusitis, unspecified: Secondary | ICD-10-CM

## 2023-08-02 MED ORDER — AMOXICILLIN 875 MG PO TABS: 875.0000 mg | ORAL_TABLET | Freq: Two times a day (BID) | ORAL | 0 refills | Status: AC

## 2023-08-02 NOTE — Patient Instructions (Signed)
 Go to optometrist--[facility] is [facility]

## 2023-08-02 NOTE — Telephone Encounter (Signed)
 Patient has been scheduled

## 2023-08-02 NOTE — Progress Notes (Signed)
    Subjective:    Patient ID: Lindsey Wells, female   DOB: [birth year], 43 y.o.   MRN: [id number]   HPI  Lindsey Wells interprets  About 1 week ago, was seeing double.  When she covered one eye (either one), she would see just one.    Stated the double vision lasted for 2 days and was continuous.   Now, only when she walks, her vision is blurry.   Is having pain or sense of pulling in her eyes and points to her upper eyelids when she looks up and when looks left or right.  Has had this for past 4 days--not when had double vision. Has had facial pressure about eyes for 2 days.  Has had a dry cough for 3 weeks.  Has had posterior pharyngeal drainage and throat irritation over same time period as cough.  Occasionally, brings up thick yellow mucous.  No fever.  No ear pain.These symptoms started after suffering from influenza for which she came here 06/25/23.  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  Diagnosed as a teenager.  Not wearing glasses.  Had a contact for that eye when a teen, but nothing since. During exam, appears to have discomfort with right eye looking to left and does admit to having a sense of spinning or going up and down like a wave/imbalance.    2.  Elevated BP:  yesterday fine with BP check.  Gets anxious when has office visit.  Current Meds  Medication Sig   metFORMIN  (GLUCOPHAGE -XR) 500 MG 24 hr tablet 1 tab by mouth twice daily with meals   No Known Allergies   Review of Systems    Objective:   BP (!) 140/94 (BP Location: Right Arm, Patient Position: Sitting, Cuff Size: Normal)   Pulse 64   Resp 16   Ht 5\' 5"  (1.651 m)   Wt 222 lb (100.7 kg)   LMP 06/26/2023 (Exact Date)   BMI 36.94 kg/m   Physical Exam NAD HEENT:  Mild tenderness over maxillary sinuses.  NT over frontal.sinuses.  PERRL, EOMI,  Eyes appear aligned with light reflection.  TMs pearly gray, throat without  injection. Neck:  Supple, No adenopathy Chest:  CTA CV:  RRR without murmur or rub.   Neuro:  A & O x 3, CN  II-XII grossly intact Motor 5/5, DTRs 2+/4 throughout.  No double vision with EOMI today       Assessment & Plan   Reported double vision:  No abnormal findings today.  Encouraged her to be seen by [facility] optometry for more in depth evaluation as does not have orange card.    2.  Sinusitis:  Augmentin 875/125 mg twice daily for 10 days.  Nasal saline.

## 2023-10-16 ENCOUNTER — Other Ambulatory Visit: Payer: Self-pay

## 2023-10-19 ENCOUNTER — Other Ambulatory Visit: Payer: Self-pay

## 2023-10-19 DIAGNOSIS — Z0000 Encounter for general adult medical examination without abnormal findings: Secondary | ICD-10-CM

## 2023-10-20 LAB — CBC WITH DIFFERENTIAL/PLATELET
Basophils Absolute: 0 10*3/uL (ref 0.0–0.2)
Basos: 0 %
EOS (ABSOLUTE): 0.2 10*3/uL (ref 0.0–0.4)
Eos: 3 %
Hematocrit: 41 % (ref 34.0–46.6)
Hemoglobin: 13.6 g/dL (ref 11.1–15.9)
Immature Grans (Abs): 0 10*3/uL (ref 0.0–0.1)
Immature Granulocytes: 0 %
Lymphocytes Absolute: 2.8 10*3/uL (ref 0.7–3.1)
Lymphs: 47 %
MCH: 28.8 pg (ref 26.6–33.0)
MCHC: 33.2 g/dL (ref 31.5–35.7)
MCV: 87 fL (ref 79–97)
Monocytes Absolute: 0.4 10*3/uL (ref 0.1–0.9)
Monocytes: 7 %
Neutrophils Absolute: 2.6 10*3/uL (ref 1.4–7.0)
Neutrophils: 43 %
Platelets: 257 10*3/uL (ref 150–450)
RBC: 4.73 x10E6/uL (ref 3.77–5.28)
RDW: 13.9 % (ref 11.7–15.4)
WBC: 6.1 10*3/uL (ref 3.4–10.8)

## 2023-10-20 LAB — LIPID PANEL W/O CHOL/HDL RATIO
Cholesterol, Total: 185 mg/dL (ref 100–199)
HDL: 38 mg/dL — ABNORMAL LOW (ref >39)
LDL Chol Calc (NIH): 116 mg/dL — ABNORMAL HIGH (ref 0–99)
Triglycerides: 175 mg/dL — ABNORMAL HIGH (ref 0–149)
VLDL Cholesterol Cal: 31 mg/dL (ref 5–40)

## 2023-10-20 LAB — HEMOGLOBIN A1C
Est. average glucose Bld gHb Est-mCnc: 114 mg/dL
Hgb A1c MFr Bld: 5.6 % (ref 4.8–5.6)

## 2023-11-04 ENCOUNTER — Ambulatory Visit: Payer: Self-pay | Admitting: Internal Medicine

## 2023-12-03 ENCOUNTER — Other Ambulatory Visit: Payer: Self-pay | Admitting: Internal Medicine

## 2023-12-03 ENCOUNTER — Ambulatory Visit: Payer: Self-pay | Admitting: Internal Medicine

## 2023-12-03 ENCOUNTER — Encounter: Payer: Self-pay | Admitting: Internal Medicine

## 2023-12-03 VITALS — BP 130/80 | HR 66 | Resp 14 | Ht 65.0 in | Wt 219.0 lb

## 2023-12-03 DIAGNOSIS — F419 Anxiety disorder, unspecified: Secondary | ICD-10-CM

## 2023-12-03 DIAGNOSIS — E282 Polycystic ovarian syndrome: Secondary | ICD-10-CM

## 2023-12-03 DIAGNOSIS — Z124 Encounter for screening for malignant neoplasm of cervix: Secondary | ICD-10-CM

## 2023-12-03 DIAGNOSIS — Z0000 Encounter for general adult medical examination without abnormal findings: Secondary | ICD-10-CM

## 2023-12-03 DIAGNOSIS — G4709 Other insomnia: Secondary | ICD-10-CM

## 2023-12-03 MED ORDER — TRAZODONE HCL 50 MG PO TABS: ORAL_TABLET | ORAL | 6 refills | Status: AC

## 2023-12-03 NOTE — Patient Instructions (Signed)
 Tome un vaso de agua antes de cada comida Tome un minimo de 6 a 8 vasos de agua diarios Coma tres veces al dia Coma una proteina y Neomia Dear grasa saludable con comida.  (huevos, pescado, pollo, pavo, y limite carnes rojas Coma 5 porciones diarias de legumbres.  Mezcle los colores Coma 2 porciones diarias de frutas con cascara cuando sea comestible Use platos pequeos Suelte su tenedor o cuchara despues de cada mordida hata que se mastique y se trague Come en la mesa con amigos o familiares por lo menos una vez al dia Apague la televisin y aparatos electrnicos durante la comida  Su objetivo debe ser perder una libra por semana  Estudios recientes indican que las personas quienes consumen todos de sus calorias durante 12 horas se bajan de pesocon Mas eficiencia.  Por ejemplo, si Usted come su primera comida a las 7:00 a.m., su comida final del dia se debe completar antes de las 7:00 p.m.

## 2023-12-03 NOTE — Progress Notes (Unsigned)
 Subjective:    Patient ID: Lindsey Wells, female   DOB: [birth year], 43 y.o.   MRN: [id number]   HPI  Erminio Bloomer interprets  CPE with pap  1.  Pap:  Obtains at [facility].  She has not had one performed in past 3 years.  Always normal.    2.  Mammogram:  Never.  No family history of breast cancer.    3.  Osteoprevention:  Drinks almond milk, but only one serving daily and one serving of cheese  daily.  Willing to increase to 3-4 servings daily.   She strength training with cardio at a gym 5 days weekly.  She does get outside daily.    4.  Guaiac Cards/FIT:  5.  Colonoscopy:  Never.  No family history of colon cancer.   6.  Immunizations:  Has not received any COVID vaccines.   Immunization History  Administered Date(s) Administered   Influenza Split 08/03/2016   Influenza, Mdck, Trivalent,PF 6+ MOS(egg free) 03/20/2023   Influenza,inj,Quad PF,6+ Mos 10/04/2015, 02/13/2018   Tdap 01/03/2016, 10/05/2016, 06/14/2018     7.  Glucose/Cholesterol:  History of prediabetes, though A1C normalized with last check in May of this year at 5.6%.   Mild elevation of triglycerides and low HDL, essentially unchanged also in May 2025.   Lipid Panel     Component Value Date/Time   CHOL 185 10/19/2023 0857   TRIG 175 (H) 10/19/2023 0857   HDL 38 (L) 10/19/2023 0857   LDLCALC 116 (H) 10/19/2023 0857   LABVLDL 31 10/19/2023 0857     Current Meds  Medication Sig   metFORMIN  (GLUCOPHAGE -XR) 500 MG 24 hr tablet 1 tab by mouth twice daily with meals   No Known Allergies  Past Medical History:  Diagnosis Date   Obesity (BMI 35.0-39.9 without comorbidity)    PCOS (polycystic ovarian syndrome) 2010   Has been treated with OCPs, conjugated estrogens, never Metformin .   Prediabetes 2024   Preterm labor    Past Surgical History:  Procedure Laterality Date   CESAREAN SECTION N/A 01/01/2016   Procedure: CESAREAN SECTION;  Surgeon: Glenys GORMAN Birk, MD;  Location: [facility] BIRTHING SUITES;   Service: Obstetrics;  Laterality: N/A;   CESAREAN SECTION N/A 12/20/2016   Procedure: REPEAT CESAREAN SECTION;  Surgeon: Herchel Gloris LABOR, MD;  Location: [facility] BIRTHING SUITES;  Service: Obstetrics;  Laterality: N/A;   CESAREAN SECTION N/A 08/17/2018   Procedure: CESAREAN SECTION;  Surgeon: Alger Gong, MD;  Location: [facility] LD ORS;  Service: Obstetrics;  Laterality: N/A;   DIAGNOSTIC LAPAROSCOPY WITH REMOVAL OF ECTOPIC PREGNANCY N/A 12/05/2022   Procedure: DIAGNOSTIC LAPAROSCOPY WITH REMOVAL OF ECTOPIC PREGNANCY AND FALLOPIAN TUBE;  Surgeon: Eveline Lynwood MATSU, MD;  Location: [facility] OR;  Service: Gynecology;  Laterality: N/A;   Family History  Problem Relation Age of Onset   Diabetes Father    Hypertension Father    Diabetes Brother    Asthma Son    Social History   Socioeconomic History   Marital status: Married    Spouse name: Rogelio   Number of children: 3   Years of education: 12   Highest education level: 12th grade  Occupational History   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   Drug use: No   Sexual activity: Yes    Birth control/protection: Condom  Other Topics Concern   Not on file  Social History Narrative   Lives with husband and 3 children and 2 dogs:  Rocco and Ginger   Social Drivers of Corporate investment banker Strain: Low Risk  (12/03/2023)   Overall Financial Resource Strain (CARDIA)    Difficulty of Paying Living Expenses: Not very hard  Food Insecurity: No Food Insecurity (12/03/2023)   Hunger Vital Sign    Worried About Running Out of Food in the  Last Year: Never true    Ran Out of Food in the Last Year: Never true  Transportation Needs: No Transportation Needs (12/03/2023)   PRAPARE - Administrator, Civil Service (Medical): No    Lack of Transportation (Non-Medical): No  Physical Activity: Not on file  Stress: Not on file  Social Connections: Not on file  Intimate Partner Violence: Not on file      Review of Systems  Psychiatric/Behavioral:  Positive for sleep disturbance (Anxious about what is going on in [place] currently[place] The patient is nervous/anxious.       Objective:   BP 130/80 (BP Location: Right Arm, Patient Position: Sitting, Cuff Size: Large)   Pulse 66   Resp 14   Ht 5' 5 (1.651 m)   Wt 219 lb (99.3 kg)   LMP 11/21/2023 (Exact Date)   BMI 36.44 kg/m   Physical Exam Chest:  Breasts:    Right: No inverted nipple, mass or nipple discharge.     Left: No inverted nipple, mass or nipple discharge.  Genitourinary:    Comments: Normal external female genitalia No cervical or vaginal lesions\ No uterine or adnexal mass or tenderness     Assessment & Plan   CPE with pap Mammogram FIT to return in 2 weeks. Encouraged COVID and influenza vaccine every fall.

## 2023-12-04 LAB — CYTOLOGY - PAP

## 2023-12-09 ENCOUNTER — Ambulatory Visit: Payer: Self-pay | Admitting: Internal Medicine

## 2024-01-22 ENCOUNTER — Telehealth: Payer: Self-pay | Admitting: Psychology

## 2024-05-06 ENCOUNTER — Other Ambulatory Visit (INDEPENDENT_AMBULATORY_CARE_PROVIDER_SITE_OTHER): Payer: Self-pay

## 2024-05-06 DIAGNOSIS — Z3490 Encounter for supervision of normal pregnancy, unspecified, unspecified trimester: Secondary | ICD-10-CM

## 2024-05-06 DIAGNOSIS — Z0000 Encounter for general adult medical examination without abnormal findings: Secondary | ICD-10-CM

## 2024-05-06 DIAGNOSIS — N912 Amenorrhea, unspecified: Secondary | ICD-10-CM

## 2024-05-06 LAB — POCT URINE PREGNANCY: Preg Test, Ur: POSITIVE — AB

## 2024-06-02 ENCOUNTER — Telehealth: Payer: Self-pay | Admitting: Internal Medicine

## 2024-06-02 NOTE — Telephone Encounter (Signed)
 Patient called today and states she came to an appointment for a pregnancy test last month. The test was positive and today she tried to enroll in the Adopt a mom program but patient was told they require a letter to confirm that she is pregnant. Patient would like to know if we can give her the letter for her to take to the Adopt a mom program.

## 2024-06-03 MED ORDER — PRENATAL PLUS VITAMIN/MINERAL 27-1 MG PO TABS: ORAL_TABLET | ORAL | 3 refills | Status: AC

## 2024-06-03 NOTE — Progress Notes (Signed)
 Call patient and have her discontinue the Trazodone  if she is finding she does not need it any furhter Have her pick up prescription for PNV at [place] Letter for Adopt a mom

## 2024-06-05 ENCOUNTER — Other Ambulatory Visit: Payer: Self-pay

## 2024-06-05 DIAGNOSIS — E669 Obesity, unspecified: Secondary | ICD-10-CM

## 2024-06-05 DIAGNOSIS — R7303 Prediabetes: Secondary | ICD-10-CM

## 2024-06-05 NOTE — Telephone Encounter (Signed)
 Note was written and process for this discussed with staff

## 2024-06-06 LAB — LIPID PANEL W/O CHOL/HDL RATIO
Cholesterol, Total: 148 mg/dL (ref 100–199)
HDL: 55 mg/dL
LDL Chol Calc (NIH): 64 mg/dL (ref 0–99)
Triglycerides: 173 mg/dL — ABNORMAL HIGH (ref 0–149)
VLDL Cholesterol Cal: 29 mg/dL (ref 5–40)

## 2024-06-06 LAB — HEMOGLOBIN A1C
Est. average glucose Bld gHb Est-mCnc: 114 mg/dL
Hgb A1c MFr Bld: 5.6 % (ref 4.8–5.6)

## 2024-06-09 ENCOUNTER — Ambulatory Visit: Payer: Self-pay | Admitting: Internal Medicine

## 2024-06-09 ENCOUNTER — Encounter: Payer: Self-pay | Admitting: Internal Medicine

## 2024-06-09 VITALS — BP 130/80 | HR 64 | Resp 12 | Ht 64.5 in | Wt 222.0 lb

## 2024-06-09 DIAGNOSIS — O3680X0 Pregnancy with inconclusive fetal viability, not applicable or unspecified: Secondary | ICD-10-CM

## 2024-06-09 DIAGNOSIS — Z3490 Encounter for supervision of normal pregnancy, unspecified, unspecified trimester: Secondary | ICD-10-CM

## 2024-06-09 NOTE — Addendum Note (Signed)
 Addended by: ADELLA ALMARIE HERO on: 06/09/2024 01:01 PM   Modules accepted: Orders

## 2024-06-09 NOTE — Progress Notes (Signed)
" ° ° °  Subjective:    Patient ID: Lindsey Wells, female   DOB: 14-Nov-1980, 44 y.o.   MRN: [id number]   HPI  Erminio Bloomer interprets    Pregnancy:  Was taking Metformin  for PCOS and Prediabetes.  She stopped, however, in October as she was concerned she was pregnant and on 06/05/2024, had a normal A1C at 5.6%.  She did not check a home pregnancy test until December, which was positive.  This was confirmed in our lab when she was in for other labs in 06/05/2024.  She did not get a letter for Adopt A Mom to get started on Prenatal care until  a few days later.   Last period started on Sept 13th and she had had monthly periods, though not particularly regular.   She started with symptoms of nausea and breast tenderness  Her pregnancy test was positive at home on Dec 6th.   Patient with history of right fallopian tube ectopic pregnancy requiring surgical removal in 11/2022.   She is not having any pelvic pain or vaginal bleeding. She only took Trazodone  once in July when prescribed and has not taken since.   EDC based on last period would be November 15, 2024, but suspect could be later as no symptoms until December.     She has not had vaccination for COVID, influenza.  We discussed RSV for her or for her baby next fall/winter.  Current Meds  Medication Sig   Prenatal Vit-Fe Fumarate-FA (PRENATAL PLUS VITAMIN/MINERAL) 27-1 MG TABS 1 tab by mouth daily   No Known Allergies   Review of Systems    Objective:   BP 130/80 (BP Location: Left Arm, Cuff Size: Large)   Pulse 64   Resp 12   Ht 5' 4.5 (1.638 m)   Wt 222 lb (100.7 kg)   LMP 11/21/2023 (Exact Date)   BMI 37.52 kg/m   Physical Exam NAD Lungs:  CTA CV:  RRR without murmur or rub.  Radial and DP pulses normal and equal Abd:  S, NT, No HSM or mass. + BS LE:  No edema.   Assessment & Plan  Pregnancy of unknown gestation with irregular periods/PCOS history and patient also with history of ectopic right tubal pregnancy:   Send for pelvic US  as doubt can get this done quickly as no obstetric appt as of yet. She will apply for financial support with [facility], but have asked her to sign up for orange card.   She declines influenza, COVID vaccines today, despite recommendation to do so. Discussed RSV vaccination for herself or immunoglobulin for her baby next fall/winter should she not receive.     2.  Prediabetes and PCOS:  Do not restart Metformin  as A1C in normal range after no use for about 3 months.  She has not taken Trazodone  for some time and should not restart.    "

## 2024-06-15 ENCOUNTER — Ambulatory Visit (HOSPITAL_COMMUNITY)
Admission: RE | Admit: 2024-06-15 | Discharge: 2024-06-15 | Disposition: A | Discharge status: Home / Self Care | Payer: Self-pay | Source: Ambulatory Visit | Attending: Internal Medicine | Admitting: Internal Medicine

## 2024-06-15 ENCOUNTER — Encounter (HOSPITAL_COMMUNITY): Payer: Self-pay

## 2024-06-15 DIAGNOSIS — O3680X0 Pregnancy with inconclusive fetal viability, not applicable or unspecified: Secondary | ICD-10-CM

## 2024-06-15 DIAGNOSIS — Z3490 Encounter for supervision of normal pregnancy, unspecified, unspecified trimester: Secondary | ICD-10-CM

## 2024-06-16 ENCOUNTER — Encounter: Payer: Self-pay | Admitting: *Deleted

## 2024-06-17 ENCOUNTER — Telehealth: Payer: Self-pay | Admitting: Internal Medicine

## 2024-06-17 NOTE — Telephone Encounter (Signed)
 Patient called and states Dr. Adella order an Ultrasound for her scheduled for yesterday and she was unable to get it done because she needs a Beta HCG test. Patient was asked to call Dr. Adella to inform her about the test and once the test is done, a new US  order needs to be placed. Patient also states that the office at [facility] can get the lab done  but it had to be authorized by Dr. Adella. Patient also shared a message was sent to Dr. Adella by the [facility] office.

## 2024-06-18 NOTE — Telephone Encounter (Signed)
 Patient called to ask an update about the message below .  Patient asked if we can call with an update.

## 2024-06-19 ENCOUNTER — Other Ambulatory Visit (HOSPITAL_COMMUNITY)
Admission: RE | Admit: 2024-06-19 | Discharge: 2024-06-19 | Disposition: A | Payer: Self-pay | Source: Ambulatory Visit | Attending: Family Medicine | Admitting: Family Medicine

## 2024-06-19 ENCOUNTER — Ambulatory Visit: Payer: Self-pay

## 2024-06-19 ENCOUNTER — Other Ambulatory Visit: Payer: Self-pay

## 2024-06-19 VITALS — BP 139/73 | HR 75 | Wt 221.1 lb

## 2024-06-19 DIAGNOSIS — O0992 Supervision of high risk pregnancy, unspecified, second trimester: Secondary | ICD-10-CM

## 2024-06-19 DIAGNOSIS — Z3A14 14 weeks gestation of pregnancy: Secondary | ICD-10-CM

## 2024-06-19 DIAGNOSIS — O0990 Supervision of high risk pregnancy, unspecified, unspecified trimester: Secondary | ICD-10-CM | POA: Insufficient documentation

## 2024-06-19 DIAGNOSIS — Z1332 Encounter for screening for maternal depression: Secondary | ICD-10-CM

## 2024-06-19 NOTE — Progress Notes (Signed)
 New OB Intake  I connected with Lindsey Wells  on 06/19/24 at  3:15 PM EST in person at [facility].  I discussed the limitations, risks, security and privacy concerns of performing an evaluation and management service by telephone and the availability of in person appointments. I also discussed with the patient that there may be a patient responsible charge related to this service. The patient expressed understanding and agreed to proceed.  I explained I am completing New OB Intake today. We discussed unofficial EDD of 12/13/24 based on Adopt-a-Mom referral. Pt is [id number]. I reviewed her allergies, medications and Medical/Surgical/OB history.    Patient Active Problem List   Diagnosis Date Noted   Elevated BP without diagnosis of hypertension 07/18/2023   Prediabetes 07/18/2023   Obesity (BMI 35.0-39.9 without comorbidity)    S/P cesarean section 08/17/2018   AMA (advanced maternal age) multigravida 35+ 03/13/2018   History of Uterine cervical insufficiency during pregnancy 02/15/2018   Supervision of high risk pregnancy, antepartum, second trimester 02/13/2018   History of preterm labor/delivery  02/13/2018   Language barrier 11/06/2016   History of cesarean delivery 08/23/2016   PCOS (polycystic ovarian syndrome) 2010     Concerns addressed today Patient reports she does not remember having a period in October 2025, however Dr. Eveline visualized IUP at bedside and does not believe dating would be consistent with her September period.  Official dating US  scheduled for 06/30/24 per Dr. Eveline.   Delivery Plans Plans to deliver at [facility]. Discussed the nature of our practice with multiple providers including residents and students as well as female and female providers. Due to the size of the practice, the delivering provider may not be the same as those providing prenatal care.   Patient is not interested in water birth.  MyChart/Babyscripts MyChart access verified. I  explained pt will have some visits in office and some virtually. Babyscripts instructions given and order placed. Patient verifies receipt of registration text/e-mail. Account successfully created and app downloaded. If patient is a candidate for Optimized scheduling, add to sticky note.   Blood Pressure Cuff/Weight Scale She has blood pressure cuff at home. Explained after first prenatal appt pt will check weekly and document in Babyscripts. Patient does have weight scale.  Anatomy US  Explained first scheduled US  will be around 19 weeks. Anatomy US  scheduled to be scheduled after dating US .  Is patient a CenteringPregnancy candidate?  Not a candidate due to Complex coordination of care needed   Is patient a Mom+Baby Combined Care candidate?  Not a candidate     Is patient a candidate for Babyscripts Optimization? No, due to language barrier and possible hx HTN.   First visit review I reviewed new OB appt with patient. Explained pt will be seen by Dr. Eveline at first visit. Discussed Lindsey Wells genetic screening with patient. Desires Merchant Navy Officer and [employer].. Routine prenatal labs collected today.   Last Pap No results found for: DIAGPAP  Lindsey [place] L Walter Grima, RN 06/19/2024  3:19 PM

## 2024-06-19 NOTE — Patient Instructions (Signed)

## 2024-06-20 LAB — CBC/D/PLT+RPR+RH+ABO+RUBIGG...
Antibody Screen: NEGATIVE
Basophils Absolute: 0 x10E3/uL (ref 0.0–0.2)
Basos: 0 %
EOS (ABSOLUTE): 0.1 x10E3/uL (ref 0.0–0.4)
Eos: 2 %
HCV Ab: NONREACTIVE
HIV Screen 4th Generation wRfx: NONREACTIVE
Hematocrit: 36.8 % (ref 34.0–46.6)
Hemoglobin: 12.8 g/dL (ref 11.1–15.9)
Hepatitis B Surface Ag: NEGATIVE
Immature Grans (Abs): 0 x10E3/uL (ref 0.0–0.1)
Immature Granulocytes: 0 %
Lymphocytes Absolute: 2.7 x10E3/uL (ref 0.7–3.1)
Lymphs: 35 %
MCH: 29.1 pg (ref 26.6–33.0)
MCHC: 34.8 g/dL (ref 31.5–35.7)
MCV: 84 fL (ref 79–97)
Monocytes Absolute: 0.5 x10E3/uL (ref 0.1–0.9)
Monocytes: 7 %
Neutrophils Absolute: 4.2 x10E3/uL (ref 1.4–7.0)
Neutrophils: 56 %
Platelets: 256 x10E3/uL (ref 150–450)
RBC: 4.4 x10E6/uL (ref 3.77–5.28)
RDW: 13.9 % (ref 11.7–15.4)
RPR Ser Ql: NONREACTIVE
Rh Factor: POSITIVE
Rubella Antibodies, IGG: 15 {index}
WBC: 7.6 x10E3/uL (ref 3.4–10.8)

## 2024-06-20 LAB — GC/CHLAMYDIA PROBE AMP (~~LOC~~) NOT AT ARMC
Chlamydia: NEGATIVE
Comment: NEGATIVE
Comment: NORMAL
Neisseria Gonorrhea: NEGATIVE

## 2024-06-20 LAB — HCV INTERPRETATION

## 2024-06-20 NOTE — Telephone Encounter (Signed)
 Received a staff message from radiology tech last Friday, Jan 16th, that a serum HCG was required for performing pelvic US . As patient uninsured, had had a home urine HCG and confirmatory urine HCG in our office with symptoms of pregnancy, I asked that they proceed with the pelvic US  regardless. Pt reportedly presented to US  yesterday for exam and was told it was canceled.  US  stated to staff she did not show. Called [facility] US  today--very confusing as to what facility she was actually scheduled. Tech there today stated a serum HCG is not required for future knowledge, she does not know who would have insisted on this. Appears patient was accepted quicker through [facility]opt A Mom than would have expected and was seen today. Appears she has appt for pelvic US  on 06/30/2024, so the issue is resolved.

## 2024-06-20 NOTE — Telephone Encounter (Signed)
 I called the radiology asking for the reason the did not perform the exam. I spoke with Tillman Sills. She said that she did not show for her pelvic US  on 1/18.  We need to call the patient again and see what happened.

## 2024-06-21 ENCOUNTER — Ambulatory Visit: Payer: Self-pay | Admitting: Obstetrics & Gynecology

## 2024-06-26 ENCOUNTER — Encounter: Payer: Self-pay | Admitting: *Deleted

## 2024-06-26 ENCOUNTER — Other Ambulatory Visit: Payer: Self-pay

## 2024-06-26 ENCOUNTER — Encounter: Payer: Self-pay | Admitting: Obstetrics & Gynecology

## 2024-06-26 ENCOUNTER — Ambulatory Visit: Payer: Self-pay | Admitting: Obstetrics & Gynecology

## 2024-06-26 VITALS — BP 136/85 | HR 71 | Wt 222.1 lb

## 2024-06-26 DIAGNOSIS — O09523 Supervision of elderly multigravida, third trimester: Secondary | ICD-10-CM

## 2024-06-26 DIAGNOSIS — Z758 Other problems related to medical facilities and other health care: Secondary | ICD-10-CM

## 2024-06-26 DIAGNOSIS — O0990 Supervision of high risk pregnancy, unspecified, unspecified trimester: Secondary | ICD-10-CM

## 2024-06-26 DIAGNOSIS — E669 Obesity, unspecified: Secondary | ICD-10-CM

## 2024-06-26 DIAGNOSIS — Z8759 Personal history of other complications of pregnancy, childbirth and the puerperium: Secondary | ICD-10-CM

## 2024-06-26 DIAGNOSIS — Z8751 Personal history of pre-term labor: Secondary | ICD-10-CM

## 2024-06-26 DIAGNOSIS — Z98891 History of uterine scar from previous surgery: Secondary | ICD-10-CM

## 2024-06-26 DIAGNOSIS — Z603 Acculturation difficulty: Secondary | ICD-10-CM

## 2024-06-26 DIAGNOSIS — O09299 Supervision of pregnancy with other poor reproductive or obstetric history, unspecified trimester: Secondary | ICD-10-CM

## 2024-06-26 DIAGNOSIS — R030 Elevated blood-pressure reading, without diagnosis of hypertension: Secondary | ICD-10-CM

## 2024-06-26 MED ORDER — ASPIRIN 81 MG PO TBEC: 81.0000 mg | DELAYED_RELEASE_TABLET | Freq: Every day | ORAL | 2 refills | Status: AC

## 2024-06-26 NOTE — Progress Notes (Signed)
 " Subjective:unsure dates    Lindsey Wells is a [id number] [redacted]w[redacted]d being seen today for her first obstetrical visit.  Her obstetrical history is significant for advanced maternal age and history of cesarean section. Patient does intend to breast feed. Pregnancy history fully reviewed.  Patient reports no complaints.  Vitals:   06/26/24 0931  BP: 136/85  Pulse: 71  Weight: 222 lb 1.6 oz (100.7 kg)    HISTORY: OB History  Gravida Para Term Preterm AB Living  6 4 3 1 1 3   SAB IAB Ectopic Multiple Live Births  0 0 1 0 4    # Outcome Date GA Lbr Len/2nd Weight Sex Type Anes PTL Lv  6 Current           5 Term 08/17/18 [redacted]w[redacted]d    CS-Unspec   LIV  4 Term 12/20/16 [redacted]w[redacted]d  7 lb 6.7 oz (3.365 kg) M CS-LTranv Spinal  LIV  3 Preterm 01/01/16 [redacted]w[redacted]d  1 lb 9 oz (0.71 kg) M CS-LTranv Spinal  ND     Complications: Preterm premature rupture of membranes, Chorioamnionitis, Incompetence of cervix  2 Term 08/12/03 [redacted]w[redacted]d  7 lb 15 oz (3.6 kg) F Vag-Spont EPI N LIV  1 Ectopic            Past Medical History:  Diagnosis Date   Obesity (BMI 35.0-39.9 without comorbidity)    PCOS (polycystic ovarian syndrome) 2010   Has been treated with OCPs, conjugated estrogens, never Metformin .   Prediabetes 2024   Preterm labor    S/P cesarean section 08/17/2018   Supervision of high risk pregnancy, antepartum, second trimester 02/13/2018              Nursing Staff    Provider      Office Location     [facility]    Dating     15.5 week US       Language     Spanish    Anatomy US      Normal 19 weeks      Flu Vaccine     02/13/18    Genetic Screen     NIPS:   AFP:   First Screen:  Quad:          TDaP vaccine          Hgb A1C or   GTT    Early   Third trimester       Rhogam      n/a          LAB RESULTS       Feeding Plan    Breast    Blood T   Past Surgical History:  Procedure Laterality Date   CESAREAN SECTION N/A 01/01/2016   Procedure: CESAREAN SECTION;  Surgeon: Glenys GORMAN Birk, MD;  Location: [facility] BIRTHING SUITES;   Service: Obstetrics;  Laterality: N/A;   CESAREAN SECTION N/A 12/20/2016   Procedure: REPEAT CESAREAN SECTION;  Surgeon: Herchel Gloris LABOR, MD;  Location: [facility] BIRTHING SUITES;  Service: Obstetrics;  Laterality: N/A;   CESAREAN SECTION N/A 08/17/2018   Procedure: CESAREAN SECTION;  Surgeon: Alger Gong, MD;  Location: [facility] LD ORS;  Service: Obstetrics;  Laterality: N/A;   DIAGNOSTIC LAPAROSCOPY WITH REMOVAL OF ECTOPIC PREGNANCY N/A 12/05/2022   Procedure: DIAGNOSTIC LAPAROSCOPY WITH REMOVAL OF ECTOPIC PREGNANCY AND FALLOPIAN TUBE;  Surgeon: Eveline Lynwood MATSU, MD;  Location: [facility] OR;  Service: Gynecology;  Laterality: N/A;   Family History  Problem Relation Age of Onset   Diabetes Father  Hypertension Father    Diabetes Brother    Asthma Son      Exam    Uterus:   16 weeks  Pelvic Exam:                               System:     Skin: normal coloration and turgor, no rashes    Neurologic: oriented, normal mood   Extremities: normal strength, tone, and muscle mass   HEENT PERRLA   Mouth/Teeth mucous membranes moist, pharynx normal without lesions   Neck supple and no masses   Cardiovascular: regular rate and rhythm   Respiratory:  appears well, vitals normal, no respiratory distress, acyanotic, normal RR, chest clear, no wheezing, crepitations, rhonchi, normal symmetric air entry   Abdomen: soft, non-tender; bowel sounds normal; no masses,  no organomegaly   Urinary:    Bedside limited US  shows singleton with FM and FHM 150   Assessment:    Pregnancy: [id number] Patient Active Problem List   Diagnosis Date Noted   Supervision of high risk pregnancy, antepartum 06/19/2024   Elevated BP without diagnosis of hypertension 07/18/2023   Prediabetes 07/18/2023   Obesity (BMI 35.0-39.9 without comorbidity)    AMA (advanced maternal age) multigravida 35+ 03/13/2018   History of Uterine cervical insufficiency during pregnancy 02/15/2018   History of preterm labor/delivery   02/13/2018   Language barrier 11/06/2016   History of cesarean delivery 08/23/2016   PCOS (polycystic ovarian syndrome) 2010        Plan:     Initial labs drawn. Prenatal vitamins. Problem list reviewed and updated. Genetic Screening discussed : declined.  Ultrasound discussed; fetal survey: ordered.  Follow up in 4 weeks. 50% of 30 min visit spent on counseling and coordination of care.  Serial US  for cervical length ASA daily    Lynwood Solomons 06/26/2024   "

## 2024-06-27 ENCOUNTER — Ambulatory Visit: Payer: Self-pay | Admitting: Obstetrics & Gynecology

## 2024-06-27 LAB — COMPREHENSIVE METABOLIC PANEL WITH GFR
ALT: 13 [IU]/L (ref 0–32)
AST: 13 [IU]/L (ref 0–40)
Albumin: 3.8 g/dL — ABNORMAL LOW (ref 3.9–4.9)
Alkaline Phosphatase: 43 [IU]/L (ref 41–116)
BUN/Creatinine Ratio: 10 (ref 9–23)
BUN: 5 mg/dL — ABNORMAL LOW (ref 6–24)
Bilirubin Total: 0.3 mg/dL (ref 0.0–1.2)
CO2: 19 mmol/L — ABNORMAL LOW (ref 20–29)
Calcium: 8.8 mg/dL (ref 8.7–10.2)
Chloride: 105 mmol/L (ref 96–106)
Creatinine, Ser: 0.48 mg/dL — ABNORMAL LOW (ref 0.57–1.00)
Globulin, Total: 2.6 g/dL (ref 1.5–4.5)
Glucose: 82 mg/dL (ref 70–99)
Potassium: 3.9 mmol/L (ref 3.5–5.2)
Sodium: 137 mmol/L (ref 134–144)
Total Protein: 6.4 g/dL (ref 6.0–8.5)
eGFR: 122 mL/min/{1.73_m2}

## 2024-06-27 LAB — TSH: TSH: 1.91 u[IU]/mL (ref 0.450–4.500)

## 2024-06-30 ENCOUNTER — Other Ambulatory Visit: Payer: Self-pay

## 2024-07-23 ENCOUNTER — Encounter: Payer: Self-pay | Admitting: Obstetrics and Gynecology

## 2024-12-04 ENCOUNTER — Other Ambulatory Visit: Payer: Self-pay

## 2024-12-08 ENCOUNTER — Encounter: Payer: Self-pay | Admitting: Internal Medicine
